# Patient Record
Sex: Female | Born: 1945
Health system: Southern US, Community
[De-identification: ages and names within clinical notes are randomized; demographics above are authoritative.]

## PROBLEM LIST (undated history)

## (undated) DIAGNOSIS — F419 Anxiety disorder, unspecified: Secondary | ICD-10-CM

## (undated) DIAGNOSIS — I4901 Ventricular fibrillation: Secondary | ICD-10-CM

## (undated) DIAGNOSIS — J189 Pneumonia, unspecified organism: Secondary | ICD-10-CM

## (undated) DIAGNOSIS — I82409 Acute embolism and thrombosis of unspecified deep veins of unspecified lower extremity: Secondary | ICD-10-CM

## (undated) DIAGNOSIS — Z973 Presence of spectacles and contact lenses: Secondary | ICD-10-CM

## (undated) DIAGNOSIS — E875 Hyperkalemia: Secondary | ICD-10-CM

## (undated) DIAGNOSIS — M1990 Unspecified osteoarthritis, unspecified site: Secondary | ICD-10-CM

## (undated) DIAGNOSIS — I2510 Atherosclerotic heart disease of native coronary artery without angina pectoris: Secondary | ICD-10-CM

## (undated) DIAGNOSIS — Z96659 Presence of unspecified artificial knee joint: Secondary | ICD-10-CM

## (undated) DIAGNOSIS — I6529 Occlusion and stenosis of unspecified carotid artery: Secondary | ICD-10-CM

## (undated) DIAGNOSIS — K219 Gastro-esophageal reflux disease without esophagitis: Secondary | ICD-10-CM

## (undated) DIAGNOSIS — F410 Panic disorder [episodic paroxysmal anxiety] without agoraphobia: Secondary | ICD-10-CM

## (undated) DIAGNOSIS — E785 Hyperlipidemia, unspecified: Secondary | ICD-10-CM

## (undated) DIAGNOSIS — I10 Essential (primary) hypertension: Secondary | ICD-10-CM

## (undated) DIAGNOSIS — I255 Ischemic cardiomyopathy: Secondary | ICD-10-CM

## (undated) DIAGNOSIS — R51 Headache: Secondary | ICD-10-CM

## (undated) DIAGNOSIS — M1710 Unilateral primary osteoarthritis, unspecified knee: Secondary | ICD-10-CM

## (undated) DIAGNOSIS — D649 Anemia, unspecified: Secondary | ICD-10-CM

## (undated) DIAGNOSIS — M1711 Unilateral primary osteoarthritis, right knee: Secondary | ICD-10-CM

## (undated) HISTORY — PX: ABDOMINAL HYSTERECTOMY: SHX81

## (undated) HISTORY — DX: Hyperkalemia: E87.5

## (undated) HISTORY — DX: Unspecified osteoarthritis, unspecified site: M19.90

## (undated) HISTORY — DX: Essential (primary) hypertension: I10

## (undated) HISTORY — DX: Atherosclerotic heart disease of native coronary artery without angina pectoris: I25.10

## (undated) HISTORY — DX: Occlusion and stenosis of unspecified carotid artery: I65.29

## (undated) HISTORY — DX: Unilateral primary osteoarthritis, unspecified knee: M17.10

## (undated) HISTORY — DX: Anemia, unspecified: D64.9

## (undated) HISTORY — DX: Presence of unspecified artificial knee joint: Z96.659

## (undated) HISTORY — DX: Unilateral primary osteoarthritis, right knee: M17.11

## (undated) HISTORY — DX: Ischemic cardiomyopathy: I25.5

## (undated) HISTORY — PX: DILATION AND CURETTAGE OF UTERUS: SHX78

## (undated) HISTORY — DX: Acute embolism and thrombosis of unspecified deep veins of unspecified lower extremity: I82.409

---

## 2011-01-29 ENCOUNTER — Ambulatory Visit (INDEPENDENT_AMBULATORY_CARE_PROVIDER_SITE_OTHER): Payer: Medicare Other

## 2011-01-29 DIAGNOSIS — I10 Essential (primary) hypertension: Secondary | ICD-10-CM

## 2011-02-28 ENCOUNTER — Ambulatory Visit (INDEPENDENT_AMBULATORY_CARE_PROVIDER_SITE_OTHER): Payer: Medicare Other | Admitting: Family Medicine

## 2011-02-28 VITALS — BP 173/80 | HR 88 | Temp 98.0°F | Resp 16 | Ht 61.75 in | Wt 153.0 lb

## 2011-02-28 DIAGNOSIS — R51 Headache: Secondary | ICD-10-CM

## 2011-02-28 DIAGNOSIS — I10 Essential (primary) hypertension: Secondary | ICD-10-CM

## 2011-02-28 MED ORDER — LISINOPRIL-HYDROCHLOROTHIAZIDE 20-25 MG PO TABS: 1.0000 | ORAL_TABLET | Freq: Every day | ORAL | Status: DC

## 2011-02-28 NOTE — Progress Notes (Signed)
  Subjective:    Patient ID: Susan Welch, female    DOB: 1945/11/03, 66 y.o.   MRN: [id number]  HPI Patient is here for blood pressure recheck. Her blood pressure is still running high. She had a headache this morning. She thought she come by and get rechecked before refilling her medicine. She took a pill this morning.   Review of Systems no cardiovascular or respiratory symptoms. Just a headache     Objective:   Physical Exam Alert and oriented. Fundi benign. No carotid bruits. Chest clear. Heart regular without murmurs. Blood pressure 170/90 in the left arm, 158/88 in the right to       Assessment & Plan:  Hypertension, under satisfactory control Headache  Lisinopril hct 20/25. If not doing better will try adding amlodipine. She is to monitor her blood pressures and come back in one to 3 months.

## 2011-02-28 NOTE — Patient Instructions (Signed)
Monitor blood pressure. If it is continuing to run high get back to [place] sooner, otherwise come back in 2-3 months

## 2011-06-23 ENCOUNTER — Ambulatory Visit (INDEPENDENT_AMBULATORY_CARE_PROVIDER_SITE_OTHER): Payer: Medicare Other | Admitting: Emergency Medicine

## 2011-06-23 VITALS — BP 131/87 | HR 87 | Temp 98.7°F | Resp 16 | Ht 61.0 in | Wt 153.2 lb

## 2011-06-23 DIAGNOSIS — I10 Essential (primary) hypertension: Secondary | ICD-10-CM

## 2011-06-23 DIAGNOSIS — R1900 Intra-abdominal and pelvic swelling, mass and lump, unspecified site: Secondary | ICD-10-CM

## 2011-06-23 MED ORDER — LISINOPRIL-HYDROCHLOROTHIAZIDE 20-25 MG PO TABS: 1.0000 | ORAL_TABLET | Freq: Every day | ORAL | Status: DC

## 2011-06-23 NOTE — Patient Instructions (Signed)
Abdominal Aortic Aneurysm  An aneurysm is the enlargement (dilatation), bulging, or ballooning out of part of the wall of a vein or artery. An aortic aneurysm is a bulging in the largest artery of the body. This artery supplies blood from the heart to the rest of the body.  The first part of the aorta is called the thoracic aorta. It leaves the heart, rises (ascends), arches, and goes down (descends) through the chest until it reaches the diaphragm. The diaphragm is the muscular part between the chest and abdomen.   The second part of the aorta is called the abdominal aorta after it has passed the diaphragm and continues down through the abdomen. The abdominal aorta ends where it splits to form the two iliac arteries that go to the legs.  Aortic aneurysms can develop anywhere along the length of the aorta. The majority are located along the abdominal aorta. The major concern with an aortic aneurysm is that it can enlarge and rupture. This can cause death unless diagnosed and treated promptly. Aneurysms can also develop blood clots or infections. CAUSES  Many aortic aneurysms are caused by arteriosclerosis. Arteriosclerosis can weaken the aortic wall. The pressure of the blood being pumped through the aorta causes it to balloon out at the site of weakness. Therefore, high blood pressure (hypertension) is associated with aneurysm. Other risk factors include:  Age over 13.   Tobacco use.   Being female.   White race.   Family history of aneurysm.   Less frequent causes of abdominal aortic aneurysms include:   Connective tissue diseases.   Abdominal trauma.   Inflammation of blood vessles (arteritis).   Inherited (congenital) malformations.   Infection.  SYMPTOMS  The signs and symptoms of an unruptured aneurysm will partly depend on its size and rate of growth.   Abdominal aortic aneurysms may cause pain. The pain typically has a deep quality as if it is piercing into the person. It is  felt most often in the lower back area. The pain is usually steady but may be relieved by changing your body position.   The person may also become aware of an abnormally prominent pulse in the belly (abdominal pulsation).  DIAGNOSIS  An aortic aneurysm may be discovered by chance on physical exam, or on X-ray studies done for other reasons. It may be suspected because of other problems such as back or abdominal pain. The following tests may help identify the problem.  X-rays of the abdomen can show calcium deposits in the aneurysm wall.   CT scanning of the abdomen, particularly with contrast medium, is accurate at showing the exact size and shape of the aneurysm.   Ultrasounds give a clear picture of the size of an aneurysm (about 98% accuracy).   MRI scanning is accurate, but often unnecessary.   An abdominal angiogram shows the source of the major blood vessels arising from the aorta. It reveals the size and extent of any aneurysm. It can also show a clot clinging to the wall of the aneurysm (mural thrombus).  TREATMENT  Treating an abdominal aortic aneurysm depends on the size. A rupture of an aneurysm is uncommon when they are less than 5 cm wide (2 inches). Rupture is far more common in aneurysms that are over 6 cm wide (2.4 inches).  Surgical repair is usually recommended for all aneurysms over 6 cm wide (2.4 inches). This depends on the health, age, and other circumstances of the individual. This type of surgery consists of  opening the abdomen, removing the aneurysm, and sewing a synthetic graft (similar to a cloth tube) in its place. A less invasive form of this surgery, using stent grafts, is sometimes recommended.   For most patients, elective repair is recommended for aneurysms between 4 and 6 cm (1.6 and 2.4 inches). Elective means the surgery can be done at your convenience. This should not be put off too long if surgery is recommended.   If you smoke, stop immediately. Smoking  is a major risk factor for enlargement and rupture.   Medications may be used to help decrease complications - these include medicine to lower blood pressure and control cholesterol.  HOME CARE INSTRUCTIONS   If you smoke, stop. Do not start smoking.   Take all medications as prescribed.   Your caregiver will tell you when to have your aneurysm rechecked, either by ultrasound or CT scan.   If your caregiver has given you a follow-up appointment, it is very important to keep that appointment. Not keeping the appointment could result in a chronic or permanent injury, pain, or disability. If there is any problem keeping the appointment, you must call back to this facility for assistance.  SEEK MEDICAL CARE IF:   You develop mild abdominal pain or pressure.   You are able to feel or perceive your aneurysm, and you sense any change.  SEEK IMMEDIATE MEDICAL CARE IF:   You develop severe abdominal pain, or severe pain moving (radiating) to your back.   You suddenly develop cold or blue toes or feet.   You suddenly develop lightheadedness or fainting spells.  MAKE SURE YOU:   Understand these instructions.   Will watch your condition.   Will get help right away if you are not doing well or get worse.  Document Released: 10/03/2004 Document Revised: 12/13/2010 Document Reviewed: 07/28/2007 [facility] Patient Information 2012 [place], [place].Hypertension As your heart beats, it forces blood through your arteries. This force is your blood pressure. If the pressure is too high, it is called hypertension (HTN) or high blood pressure. HTN is dangerous because you may have it and not know it. High blood pressure may mean that your heart has to work harder to pump blood. Your arteries may be narrow or stiff. The extra work puts you at risk for heart disease, stroke, and other problems.  Blood pressure consists of two numbers, a higher number over a lower, 110/72, for example. It is stated as "110  over 72." The ideal is below 120 for the top number (systolic) and under 80 for the bottom (diastolic). Write down your blood pressure today. You should pay close attention to your blood pressure if you have certain conditions such as:  Heart failure.   Prior heart attack.   Diabetes   Chronic kidney disease.   Prior stroke.   Multiple risk factors for heart disease.  To see if you have HTN, your blood pressure should be measured while you are seated with your arm held at the level of the heart. It should be measured at least twice. A one-time elevated blood pressure reading (especially in the Emergency Department) does not mean that you need treatment. There may be conditions in which the blood pressure is different between your right and left arms. It is important to see your caregiver soon for a recheck. Most people have essential hypertension which means that there is not a specific cause. This type of high blood pressure may be lowered by changing lifestyle factors such  as:  Stress.   Smoking.   Lack of exercise.   Excessive weight.   Drug/tobacco/alcohol use.   Eating less salt.  Most people do not have symptoms from high blood pressure until it has caused damage to the body. Effective treatment can often prevent, delay or reduce that damage. TREATMENT  When a cause has been identified, treatment for high blood pressure is directed at the cause. There are a large number of medications to treat HTN. These fall into several categories, and your caregiver will help you select the medicines that are best for you. Medications may have side effects. You should review side effects with your caregiver. If your blood pressure stays high after you have made lifestyle changes or started on medicines,   Your medication(s) may need to be changed.   Other problems may need to be addressed.   Be certain you understand your prescriptions, and know how and when to take your medicine.   Be  sure to follow up with your caregiver within the time frame advised (usually within two weeks) to have your blood pressure rechecked and to review your medications.   If you are taking more than one medicine to lower your blood pressure, make sure you know how and at what times they should be taken. Taking two medicines at the same time can result in blood pressure that is too low.  SEEK IMMEDIATE MEDICAL CARE IF:  You develop a severe headache, blurred or changing vision, or confusion.   You have unusual weakness or numbness, or a faint feeling.   You have severe chest or abdominal pain, vomiting, or breathing problems.  MAKE SURE YOU:   Understand these instructions.   Will watch your condition.   Will get help right away if you are not doing well or get worse.  Document Released: 12/24/2004 Document Revised: 12/13/2010 Document Reviewed: 08/14/2007 [facility] Patient Information 2012 [place], [place].

## 2011-06-23 NOTE — Progress Notes (Signed)
   Patient Name: Susan Welch Date of Birth: 1945-02-17 Medical Record Number: [id number] Gender: female Date of Encounter: 06/23/2011  History of Present Illness:  Susan Welch is a 66 y.o. very pleasant female patient who presents with the following:  History of hypertension and under good control with no adverse effect of medication.  Was told by chiropractor that he noticed she had an obvious aorta on xray and that she should let her doctor know about.    There is no problem list on file for this patient.  No past medical history on file. No past surgical history on file. History  Substance Use Topics  . Smoking status: Former Games developer  . Smokeless tobacco: Not on file  . Alcohol Use: Not on file   No family history on file. No Known Allergies  Medication list has been reviewed and updated.  Prior to Admission medications   Medication Sig Start Date End Date Taking? Authorizing Provider  lisinopril-hydrochlorothiazide (PRINZIDE,ZESTORETIC) 20-25 MG per tablet Take 1 tablet by mouth daily. 02/28/11 02/28/12 Yes Peyton Najjar, MD    Review of Systems:  As per HPI, otherwise negative.    Physical Examination: Filed Vitals:   06/23/11 1348  BP: 131/87  Pulse: 87  Temp: 98.7 F (37.1 C)  Resp: 16   Filed Vitals:   06/23/11 1348  Height: 5\' 1"  (1.549 m)  Weight: 153 lb 3.2 oz (69.491 kg)   Body mass index is 28.95 kg/(m^2). Ideal Body Weight: Weight in (lb) to have BMI = 25: 132   GEN: WDWN, NAD, Non-toxic, A & O x 3 HEENT: Atraumatic, Normocephalic. Neck supple. No masses, No LAD. Ears and Nose: No external deformity. CV: RRR, No M/G/R. No JVD. No thrill. No extra heart sounds. PULM: CTA B, no wheezes, crackles, rhonchi. No retractions. No resp. distress. No accessory muscle use. ABD: S, NT, ND, +BS. No rebound. No HSM. EXTR: No c/c/e NEURO Normal gait.  PSYCH: Normally interactive. Conversant. Not depressed or anxious appearing.  Calm demeanor.    Assessment and Plan: Hypertension  Will have come back and obtain labs when fasting.  Susan Dane, MD

## 2011-06-26 ENCOUNTER — Other Ambulatory Visit (INDEPENDENT_AMBULATORY_CARE_PROVIDER_SITE_OTHER): Payer: Medicare Other | Admitting: Family Medicine

## 2011-06-26 ENCOUNTER — Ambulatory Visit: Payer: Medicare Other | Admitting: Family Medicine

## 2011-06-26 DIAGNOSIS — I10 Essential (primary) hypertension: Secondary | ICD-10-CM

## 2011-06-26 LAB — COMPREHENSIVE METABOLIC PANEL
BUN: 21 mg/dL (ref 6–23)
CO2: 28 mEq/L (ref 19–32)
Creat: 0.72 mg/dL (ref 0.50–1.10)
Glucose, Bld: 89 mg/dL (ref 70–99)
Total Bilirubin: 0.3 mg/dL (ref 0.3–1.2)
Total Protein: 7 g/dL (ref 6.0–8.3)

## 2011-06-26 LAB — POCT CBC
HCT, POC: 39.1 % (ref 37.7–47.9)
Hemoglobin: 12.5 g/dL (ref 12.2–16.2)
Lymph, poc: 2.6 (ref 0.6–3.4)
MCH, POC: 28.3 pg (ref 27–31.2)
MCHC: 32 g/dL (ref 31.8–35.4)
MCV: 88.7 fL (ref 80–97)
WBC: 7.7 10*3/uL (ref 4.6–10.2)

## 2011-06-26 LAB — LIPID PANEL
Cholesterol: 258 mg/dL — ABNORMAL HIGH (ref 0–200)
Total CHOL/HDL Ratio: 4.7 Ratio

## 2011-06-26 LAB — TSH: TSH: 2.687 u[IU]/mL (ref 0.350–4.500)

## 2011-06-26 NOTE — Progress Notes (Signed)
      Patient Name: Susan Welch Date of Birth: [birth year] Medical Record Number: [id number] Gender: female Date of Encounter: 06/26/2011  History of Present Illness:  Susan Welch is a 66 y.o. very pleasant female patient who presents with the following:  Here just to have fasting BW today  There is no problem list on file for this patient.  No past medical history on file. No past surgical history on file. History  Substance Use Topics  . Smoking status: Former Games developer  . Smokeless tobacco: Not on file  . Alcohol Use: Not on file   No family history on file. No Known Allergies  Medication list has been reviewed and updated.  Prior to Admission medications   Medication Sig Start Date End Date Taking? Authorizing Provider  lisinopril-hydrochlorothiazide (PRINZIDE,ZESTORETIC) 20-25 MG per tablet Take 1 tablet by mouth daily. 06/23/11 06/22/12  Phillips Odor, MD    Review of Systems:  Not reviewed.  She is fasting today  Physical Examination: Filed Vitals:   06/26/11 0818  BP: 137/80  Pulse: 83  Temp: 98.1 F (36.7 C)  Resp: 16   Filed Vitals:   06/26/11 0818  Height: 5\' 3"  (1.6 m)  Weight: 154 lb (69.854 kg)   Body mass index is 27.28 kg/(m^2). Ideal Body Weight: Weight in (lb) to have BMI = 25: 140.8   .  Assessment and Plan: Wrong template entered- disregard this note  Jurnee Nakayama, MD

## 2011-06-26 NOTE — Progress Notes (Signed)
Here for a lab visit only today.  Ordered FLP, CMP, TSH and CBC.  Will forward results to Dr. Dareen Piano.

## 2011-06-28 ENCOUNTER — Ambulatory Visit
Admission: RE | Admit: 2011-06-28 | Discharge: 2011-06-28 | Disposition: A | Discharge status: Home / Self Care | Payer: Medicare Other | Source: Ambulatory Visit | Attending: Emergency Medicine | Admitting: Emergency Medicine

## 2011-06-28 ENCOUNTER — Other Ambulatory Visit: Payer: Self-pay | Admitting: Family Medicine

## 2011-06-28 DIAGNOSIS — I7789 Other specified disorders of arteries and arterioles: Secondary | ICD-10-CM

## 2011-06-28 DIAGNOSIS — R1900 Intra-abdominal and pelvic swelling, mass and lump, unspecified site: Secondary | ICD-10-CM

## 2011-06-28 DIAGNOSIS — I10 Essential (primary) hypertension: Secondary | ICD-10-CM

## 2011-07-15 ENCOUNTER — Telehealth: Payer: Self-pay

## 2011-07-15 NOTE — Telephone Encounter (Signed)
The patient called to inquire about the results of her recent ultrasound.  Please call patient at [phone] regarding these results.

## 2011-07-15 NOTE — Telephone Encounter (Signed)
Please review u/s and advise.

## 2011-07-16 ENCOUNTER — Telehealth: Payer: Self-pay | Admitting: Family Medicine

## 2011-07-16 NOTE — Telephone Encounter (Signed)
Pt notified and understands. Susan Welch

## 2011-07-16 NOTE — Telephone Encounter (Signed)
I reviewed the [place] - it looks normal.

## 2011-07-16 NOTE — Telephone Encounter (Signed)
Please reviews Pt's labs done 06/26/11 and advise- she is very interested in her chol. numbers. I did tell her they were elevated but I needed a provider to check out the labs before officially saying anything further. Please advise and call Pt.  Thanks! Eileen Stanford

## 2011-07-17 ENCOUNTER — Encounter: Payer: Self-pay | Admitting: Emergency Medicine

## 2011-07-17 MED ORDER — ATORVASTATIN CALCIUM 20 MG PO TABS: 20.0000 mg | ORAL_TABLET | Freq: Every day | ORAL | Status: DC

## 2011-07-17 NOTE — Telephone Encounter (Signed)
Hi!  I ordered labs for this patient when she came in for fasting labs- we had not realized you had ordered them already.  Thanks!

## 2011-09-20 ENCOUNTER — Ambulatory Visit (INDEPENDENT_AMBULATORY_CARE_PROVIDER_SITE_OTHER): Payer: Medicare Other | Admitting: Internal Medicine

## 2011-09-20 VITALS — BP 122/74 | HR 86 | Temp 98.2°F | Resp 16 | Ht 63.0 in | Wt 157.0 lb

## 2011-09-20 DIAGNOSIS — E789 Disorder of lipoprotein metabolism, unspecified: Secondary | ICD-10-CM | POA: Insufficient documentation

## 2011-09-20 DIAGNOSIS — I10 Essential (primary) hypertension: Secondary | ICD-10-CM

## 2011-09-20 DIAGNOSIS — E785 Hyperlipidemia, unspecified: Secondary | ICD-10-CM | POA: Insufficient documentation

## 2011-09-20 DIAGNOSIS — Z7189 Other specified counseling: Secondary | ICD-10-CM

## 2011-09-20 HISTORY — DX: Essential (primary) hypertension: I10

## 2011-09-20 LAB — POCT CBC
MCH, POC: 28.2 pg (ref 27–31.2)
MCHC: 30.7 g/dL — AB (ref 31.8–35.4)
MCV: 91.8 fL (ref 80–97)
MID (cbc): 0.5 (ref 0–0.9)
POC LYMPH PERCENT: 28.6 %L (ref 10–50)
Platelet Count, POC: 358 10*3/uL (ref 142–424)
RDW, POC: 13.6 %
WBC: 8.5 10*3/uL (ref 4.6–10.2)

## 2011-09-20 MED ORDER — ATORVASTATIN CALCIUM 20 MG PO TABS: 20.0000 mg | ORAL_TABLET | Freq: Every day | ORAL | Status: DC

## 2011-09-20 MED ORDER — LISINOPRIL-HYDROCHLOROTHIAZIDE 20-25 MG PO TABS: 1.0000 | ORAL_TABLET | Freq: Every day | ORAL | Status: DC

## 2011-09-20 NOTE — Progress Notes (Signed)
  Subjective:    Patient ID: Susan Welch, female    DOB: [birth year], 66 y.o.   MRN: [id number]  HPI  66 year old female is here today for refill on lisinopril.  Patient comes to [facility].      Review of Systems     Objective:   Physical Exam        Assessment & Plan:

## 2011-09-20 NOTE — Patient Instructions (Addendum)

## 2011-09-20 NOTE — Progress Notes (Signed)
  Subjective:    Patient ID: Susan Welch, female    DOB: May 17, 1945, 66 y.o.   MRN: [id number]  HPI F/up htn and lipids. On her 1st statin, lipitor withno side affects. No problems with either med   Review of Systems     Objective:   Physical Exam Lungs clear Heart nl labs      Assessment & Plan:  RF meds 1 yr

## 2011-09-21 LAB — COMPREHENSIVE METABOLIC PANEL
BUN: 20 mg/dL (ref 6–23)
CO2: 26 mEq/L (ref 19–32)
Calcium: 9.9 mg/dL (ref 8.4–10.5)
Chloride: 104 mEq/L (ref 96–112)
Creat: 0.73 mg/dL (ref 0.50–1.10)

## 2011-09-21 LAB — LIPID PANEL: LDL Cholesterol: 85 mg/dL (ref 0–99)

## 2011-09-24 ENCOUNTER — Encounter: Payer: Self-pay | Admitting: *Deleted

## 2012-01-30 ENCOUNTER — Ambulatory Visit (INDEPENDENT_AMBULATORY_CARE_PROVIDER_SITE_OTHER): Payer: Medicare Other | Admitting: Family Medicine

## 2012-01-30 VITALS — BP 130/86 | HR 106 | Temp 97.3°F | Resp 18 | Ht 62.5 in | Wt 158.0 lb

## 2012-01-30 DIAGNOSIS — T887XXA Unspecified adverse effect of drug or medicament, initial encounter: Secondary | ICD-10-CM

## 2012-01-30 DIAGNOSIS — E663 Overweight: Secondary | ICD-10-CM

## 2012-01-30 DIAGNOSIS — I10 Essential (primary) hypertension: Secondary | ICD-10-CM

## 2012-01-30 DIAGNOSIS — E785 Hyperlipidemia, unspecified: Secondary | ICD-10-CM

## 2012-01-30 NOTE — Patient Instructions (Addendum)
Hypertriglyceridemia  Diet for High blood levels of Triglycerides Most fats in food are triglycerides. Triglycerides in your blood are stored as fat in your body. High levels of triglycerides in your blood may put you at a greater risk for heart disease and stroke.  Normal triglyceride levels are less than 150 mg/dL. Borderline high levels are 150-199 mg/dl. High levels are 200 - 499 mg/dL, and very high triglyceride levels are greater than 500 mg/dL. The decision to treat high triglycerides is generally based on the level. For people with borderline or high triglyceride levels, treatment includes weight loss and exercise. Drugs are recommended for people with very high triglyceride levels. Many people who need treatment for high triglyceride levels have metabolic syndrome. This syndrome is a collection of disorders that often include: insulin resistance, high blood pressure, blood clotting problems, high cholesterol and triglycerides. TESTING PROCEDURE FOR TRIGLYCERIDES  You should not eat 4 hours before getting your triglycerides measured. The normal range of triglycerides is between 10 and 250 milligrams per deciliter (mg/dl). Some people may have extreme levels (1000 or above), but your triglyceride level may be too high if it is above 150 mg/dl, depending on what other risk factors you have for heart disease.  People with high blood triglycerides may also have high blood cholesterol levels. If you have high blood cholesterol as well as high blood triglycerides, your risk for heart disease is probably greater than if you only had high triglycerides. High blood cholesterol is one of the main risk factors for heart disease. CHANGING YOUR DIET  Your weight can affect your blood triglyceride level. If you are more than 20% above your ideal body weight, you may be able to lower your blood triglycerides by losing weight. Eating less and exercising regularly is the best way to combat this. Fat provides more  calories than any other food. The best way to lose weight is to eat less fat. Only 30% of your total calories should come from fat. Less than 7% of your diet should come from saturated fat. A diet low in fat and saturated fat is the same as a diet to decrease blood cholesterol. By eating a diet lower in fat, you may lose weight, lower your blood cholesterol, and lower your blood triglyceride level.  Eating a diet low in fat, especially saturated fat, may also help you lower your blood triglyceride level. Ask your dietitian to help you figure how much fat you can eat based on the number of calories your caregiver has prescribed for you.  Exercise, in addition to helping with weight loss may also help lower triglyceride levels.   Alcohol can increase blood triglycerides. You may need to stop drinking alcoholic beverages.  Too much carbohydrate in your diet may also increase your blood triglycerides. Some complex carbohydrates are necessary in your diet. These may include bread, rice, potatoes, other starchy vegetables and cereals.  Reduce "simple" carbohydrates. These may include pure sugars, candy, honey, and jelly without losing other nutrients. If you have the kind of high blood triglycerides that is affected by the amount of carbohydrates in your diet, you will need to eat less sugar and less high-sugar foods. Your caregiver can help you with this.  Adding 2-4 grams of fish oil (EPA+ DHA) may also help lower triglycerides. Speak with your caregiver before adding any supplements to your regimen. Following the Diet  Maintain your ideal weight. Your caregivers can help you with a diet. Generally, eating less food and getting more   exercise will help you lose weight. Joining a weight control group may also help. Ask your caregivers for a good weight control group in your area.  Eat low-fat foods instead of high-fat foods. This can help you lose weight too.  These foods are lower in fat. Eat MORE of these:    Dried beans, peas, and lentils.  Egg whites.  Low-fat cottage cheese.  Fish.  Lean cuts of meat, such as round, sirloin, rump, and flank (cut extra fat off meat you fix).  Whole grain breads, cereals and pasta.  Skim and nonfat dry milk.  Low-fat yogurt.  Poultry without the skin.  Cheese made with skim or part-skim milk, such as mozzarella, parmesan, farmers', ricotta, or pot cheese. These are higher fat foods. Eat LESS of these:   Whole milk and foods made from whole milk, such as American, blue, cheddar, monterey jack, and swiss cheese  High-fat meats, such as luncheon meats, sausages, knockwurst, bratwurst, hot dogs, ribs, corned beef, ground pork, and regular ground beef.  Fried foods. Limit saturated fats in your diet. Substituting unsaturated fat for saturated fat may decrease your blood triglyceride level. You will need to read package labels to know which products contain saturated fats.  These foods are high in saturated fat. Eat LESS of these:   Fried pork skins.  Whole milk.  Skin and fat from poultry.  Palm oil.  Butter.  Shortening.  Cream cheese.  Bacon.  Margarines and baked goods made from listed oils.  Vegetable shortenings.  Chitterlings.  Fat from meats.  Coconut oil.  Palm kernel oil.  Lard.  Cream.  Sour cream.  Fatback.  Coffee whiteners and non-dairy creamers made with these oils.  Cheese made from whole milk. Use unsaturated fats (both polyunsaturated and monounsaturated) moderately. Remember, even though unsaturated fats are better than saturated fats; you still want a diet low in total fat.  These foods are high in unsaturated fat:   Canola oil.  Sunflower oil.  Mayonnaise.  Almonds.  Peanuts.  Pine nuts.  Margarines made with these oils.  Safflower oil.  Olive oil.  Avocados.  Cashews.  Peanut butter.  Sunflower seeds.  Soybean oil.  Peanut  oil.  Olives.  Pecans.  Walnuts.  Pumpkin seeds. Avoid sugar and other high-sugar foods. This will decrease carbohydrates without decreasing other nutrients. Sugar in your food goes rapidly to your blood. When there is excess sugar in your blood, your liver may use it to make more triglycerides. Sugar also contains calories without other important nutrients.  Eat LESS of these:   Sugar, brown sugar, powdered sugar, jam, jelly, preserves, honey, syrup, molasses, pies, candy, cakes, cookies, frosting, pastries, colas, soft drinks, punches, fruit drinks, and regular gelatin.  Avoid alcohol. Alcohol, even more than sugar, may increase blood triglycerides. In addition, alcohol is high in calories and low in nutrients. Ask for sparkling water, or a diet soft drink instead of an alcoholic beverage. Suggestions for planning and preparing meals   Bake, broil, grill or roast meats instead of frying.  Remove fat from meats and skin from poultry before cooking.  Add spices, herbs, lemon juice or vinegar to vegetables instead of salt, rich sauces or gravies.  Use a non-stick skillet without fat or use no-stick sprays.  Cool and refrigerate stews and broth. Then remove the hardened fat floating on the surface before serving.  Refrigerate meat drippings and skim off fat to make low-fat gravies.  Serve more fish.  Use less butter,   margarine and other high-fat spreads on bread or vegetables.  Use skim or reconstituted non-fat dry milk for cooking.  Cook with low-fat cheeses.  Substitute low-fat yogurt or cottage cheese for all or part of the sour cream in recipes for sauces, dips or congealed salads.  Use half yogurt/half mayonnaise in salad recipes.  Substitute evaporated skim milk for cream. Evaporated skim milk or reconstituted non-fat dry milk can be whipped and substituted for whipped cream in certain recipes.  Choose fresh fruits for dessert instead of high-fat foods such as pies or  cakes. Fruits are naturally low in fat. When Dining Out   Order low-fat appetizers such as fruit or vegetable juice, pasta with vegetables or tomato sauce.  Select clear, rather than cream soups.  Ask that dressings and gravies be served on the side. Then use less of them.  Order foods that are baked, broiled, poached, steamed, stir-fried, or roasted.  Ask for margarine instead of butter, and use only a small amount.  Drink sparkling water, unsweetened tea or coffee, or diet soft drinks instead of alcohol or other sweet beverages. QUESTIONS AND ANSWERS ABOUT OTHER FATS IN THE BLOOD: SATURATED FAT, TRANS FAT, AND CHOLESTEROL What is trans fat? Trans fat is a type of fat that is formed when vegetable oil is hardened through a process called hydrogenation. This process helps makes foods more solid, gives them shape, and prolongs their shelf life. Trans fats are also called hydrogenated or partially hydrogenated oils.  What do saturated fat, trans fat, and cholesterol in foods have to do with heart disease? Saturated fat, trans fat, and cholesterol in the diet all raise the level of LDL "bad" cholesterol in the blood. The higher the LDL cholesterol, the greater the risk for coronary heart disease (CHD). Saturated fat and trans fat raise LDL similarly.  What foods contain saturated fat, trans fat, and cholesterol? High amounts of saturated fat are found in animal products, such as fatty cuts of meat, chicken skin, and full-fat dairy products like butter, whole milk, cream, and cheese, and in tropical vegetable oils such as palm, palm kernel, and coconut oil. Trans fat is found in some of the same foods as saturated fat, such as vegetable shortening, some margarines (especially hard or stick margarine), crackers, cookies, baked goods, fried foods, salad dressings, and other processed foods made with partially hydrogenated vegetable oils. Small amounts of trans fat also occur naturally in some animal  products, such as milk products, beef, and lamb. Foods high in cholesterol include liver, other organ meats, egg yolks, shrimp, and full-fat dairy products. How can I use the new food label to make heart-healthy food choices? Check the Nutrition Facts panel of the food label. Choose foods lower in saturated fat, trans fat, and cholesterol. For saturated fat and cholesterol, you can also use the Percent Daily Value (%DV): 5% DV or less is low, and 20% DV or more is high. (There is no %DV for trans fat.) Use the Nutrition Facts panel to choose foods low in saturated fat and cholesterol, and if the trans fat is not listed, read the ingredients and limit products that list shortening or hydrogenated or partially hydrogenated vegetable oil, which tend to be high in trans fat. POINTS TO REMEMBER:   Discuss your risk for heart disease with your caregivers, and take steps to reduce risk factors.  Change your diet. Choose foods that are low in saturated fat, trans fat, and cholesterol.  Add exercise to your daily routine if   it is not already being done. Participate in physical activity of moderate intensity, like brisk walking, for at least 30 minutes on most, and preferably all days of the week. No time? Break the 30 minutes into three, 10-minute segments during the day.  Stop smoking. If you do smoke, contact your caregiver to discuss ways in which they can help you quit.  Do not use street drugs.  Maintain a normal weight.  Maintain a healthy blood pressure.  Keep up with your blood work for checking the fats in your blood as directed by your caregiver. Document Released: 10/12/2003 Document Revised: 06/25/2011 Document Reviewed: 05/09/2008 [facility] Patient Information 2013 [place], [place].    [facility] web site- link to Lipid Clinic and look for Lipid Clinic information.  Also get Citracal + D and take it twice a day for bone health.

## 2012-02-01 ENCOUNTER — Encounter: Payer: Self-pay | Admitting: Family Medicine

## 2012-02-01 NOTE — Progress Notes (Signed)
S:  This 67 y.o  Cauc female is here to discuss medication side effect and treatment for elevated lipids.  Lipid panel in June 2013 showed TChol= 258, TGs= 176 and LDL=168. Pt started Atorvastatin in July 2013 and had done well until recently, when she began to experience increasing knee pain w/ mild myalgias. She is fairly active but knee pain became so severe that she had to curtail her physical fitness routine. Pt has discontinued Atorvastatin more than 1 month ago. She plans to see an Orthopedist within the next month to have a thorough evaluation of knee pain. She would like to address this lipid abnormality in some way that does not include statins/ medications. She denies abd pain, n/v, change in stools or cognitive problems.  HTN- controlled on medication. BP checks away from office show readings c/w today's visit. Pt denies fatigue, diaphoresis, CP or tightness, palpitations, SOB or DOE, cough, HA, dizziness, weakness, numbness or syncoope.  ROS: As per HPI.  O:  Filed Vitals:   01/30/12 1527  BP: 130/86  Pulse: 106  Temp: 97.3 F (36.3 C)  Resp: 18   GEN: In NAD; WN/WD. HENT: [place]/AT; EOMI w/ clear conj/ sclerae. EACs normal. Oroph moist and clear. Otherwise unremarkable. COR; RRR. LUNGS: Normal resp rate and effort. SKIN: W&D; no rashes, erythema or pallor. MS: MAEs; no deformities. Knees- good ROM; no effusion or redness. Nontender w/ palpation. NEURO: A&O x 3; CNs intact. Nonfocal.  A/P:  1. Hyperlipidemia    Pt will remain off statin; labs to be rechecked in future.She will take Fish Oil  2. Medication side effect   3. Overweight (BMI 25.0-29.9)    Will resume physical activities when knee pain resolves.  4. HTN, goal below 140/90       Continue current medication.

## 2012-02-26 ENCOUNTER — Telehealth: Payer: Self-pay

## 2012-02-26 NOTE — Telephone Encounter (Signed)
Received a call from Wayne at [facility] concerning a surgical clearance they had sent to Dr Audria Nine for this pt. After checking with Dr Audria Nine, she reviewed the request and is unable to clear for surgery w/out seeing pt to eval first. She saw pt for unrelated matter recently but has no EKG on file for pt and has not eval pt for safety of surgery. Called [facility] back to advise her.  Called pt to advise her that she needs to RTC for clearance. Pt apologized bc she had meant to come in for a CPE and had not had a chance to call to set up appt. I transferred her to billing to sch appt.

## 2012-04-03 ENCOUNTER — Ambulatory Visit (INDEPENDENT_AMBULATORY_CARE_PROVIDER_SITE_OTHER): Payer: Medicare Other | Admitting: Family Medicine

## 2012-04-03 ENCOUNTER — Encounter: Payer: Self-pay | Admitting: Family Medicine

## 2012-04-03 VITALS — BP 168/90 | HR 72 | Temp 97.4°F | Resp 16 | Ht 61.5 in | Wt 151.0 lb

## 2012-04-03 DIAGNOSIS — Z1231 Encounter for screening mammogram for malignant neoplasm of breast: Secondary | ICD-10-CM

## 2012-04-03 DIAGNOSIS — Z23 Encounter for immunization: Secondary | ICD-10-CM

## 2012-04-03 DIAGNOSIS — E785 Hyperlipidemia, unspecified: Secondary | ICD-10-CM

## 2012-04-03 DIAGNOSIS — I10 Essential (primary) hypertension: Secondary | ICD-10-CM

## 2012-04-03 DIAGNOSIS — F411 Generalized anxiety disorder: Secondary | ICD-10-CM

## 2012-04-03 DIAGNOSIS — Z0000 Encounter for general adult medical examination without abnormal findings: Secondary | ICD-10-CM

## 2012-04-03 LAB — POCT URINALYSIS DIPSTICK
Bilirubin, UA: NEGATIVE
Glucose, UA: NEGATIVE
Leukocytes, UA: NEGATIVE
Nitrite, UA: NEGATIVE
pH, UA: 7

## 2012-04-03 LAB — LIPID PANEL
HDL: 55 mg/dL (ref >39)
Triglycerides: 162 mg/dL — ABNORMAL HIGH (ref <150)

## 2012-04-03 LAB — BASIC METABOLIC PANEL
CO2: 29 mEq/L (ref 19–32)
Calcium: 9.6 mg/dL (ref 8.4–10.5)
Glucose, Bld: 83 mg/dL (ref 70–99)
Sodium: 141 mEq/L (ref 135–145)

## 2012-04-03 NOTE — Progress Notes (Signed)
Subjective:    Patient ID: Susan Welch, female    DOB: [birth year], 67 y.o.   MRN: [id number]  HPI  This 67 y.o. Cauc female is here for CPE (pre-operative clearance). She is planning to under-  go TKR (L knee first) later this year. For joint pain, ORTHO prescribed Meloxicam which she takes  as needed.    Pt has Hyperlipidemia but has remained off statin due to side effect. She now takes [employer]. Diet modifications include no meat except occasional salmon, more tofu and fruits and  vegetables.    She reports intermittent panic attacks due to personal family issue which will be resolved within  next few months. She does not experience the attacks if she is occupied with activities such as  work. She declines any medication at this time.   Pt has HTN and BP readings at home are normal: SBP= 120-150.   HCM: Last MMG was ~ 15 years ago in [place], [place] (normal per pt); does BSE            Last CRS- ?   Review of Systems  Constitutional: Negative.   HENT: Positive for sneezing, postnasal drip and tinnitus.   Eyes: Negative.   Respiratory: Positive for chest tightness. Negative for cough, choking and shortness of breath.   Cardiovascular: Positive for palpitations. Negative for chest pain.  Gastrointestinal: Negative.   Endocrine: Negative.   Genitourinary: Negative.   Musculoskeletal: Positive for gait problem.  Skin: Negative.   Allergic/Immunologic: Negative.   Neurological: Negative.   Hematological: Negative.   Psychiatric/Behavioral: Positive for sleep disturbance. Negative for suicidal ideas, dysphoric mood, decreased concentration and agitation. The patient is nervous/anxious.        Objective:   Physical Exam  Nursing note and vitals reviewed. Constitutional: She is oriented to person, place, and time. She appears well-developed and well-nourished. No distress.  HENT:  Head: Normocephalic and atraumatic.  Right Ear: Hearing, tympanic membrane,  external ear and ear canal normal.  Left Ear: Hearing, tympanic membrane, external ear and ear canal normal.  Nose: No nasal deformity or septal deviation. Right sinus exhibits maxillary sinus tenderness. Right sinus exhibits no frontal sinus tenderness. Left sinus exhibits maxillary sinus tenderness. Left sinus exhibits no frontal sinus tenderness.  Mouth/Throat: Uvula is midline, oropharynx is clear and moist and mucous membranes are normal. No oral lesions. Normal dentition. No dental caries.  Eyes: Conjunctivae and EOM are normal. Pupils are equal, round, and reactive to light. No scleral icterus.  Neck: Normal range of motion. Neck supple. No JVD present. No thyromegaly present.  Cardiovascular: Normal rate, regular rhythm, normal heart sounds and intact distal pulses.  Exam reveals no gallop and no friction rub.   No murmur heard. Pulmonary/Chest: Effort normal and breath sounds normal. No respiratory distress.  Abdominal: Soft. She exhibits pulsatile midline mass. She exhibits no distension, no abdominal bruit and no mass. Bowel sounds are decreased. There is no hepatosplenomegaly. There is no tenderness. There is no guarding and no CVA tenderness. No hernia.  Genitourinary:  NEFG; no exam otherwise.  Musculoskeletal: She exhibits tenderness. She exhibits no edema.  Knees- deg changes (bilateral) w/ decreased ROM and tenderness at tibial plateau. No effusions.  Lymphadenopathy:    She has no cervical adenopathy.  Neurological: She is alert and oriented to person, place, and time. She has normal reflexes. No cranial nerve deficit. She exhibits normal muscle tone. Coordination normal.  Skin: Skin is warm and dry. No erythema. No pallor.  Numerous Seb Ks of various colors/ sizes on trunk (mostly back): largest is ~3 cm, uniform in color and located on R upper flank  Psychiatric: She has a normal mood and affect. Her behavior is normal. Judgment and thought content normal.     Results for  orders placed in visit on 04/03/12  POCT URINALYSIS DIPSTICK      Result Value Range   Color, UA yellow     Clarity, UA clear     Glucose, UA neg     Bilirubin, UA neg     Ketones, UA neg     Spec Grav, UA 1.020     Blood, UA neg     pH, UA 7.0     Protein, UA neg     Urobilinogen, UA 0.2     Nitrite, UA neg     Leukocytes, UA Negative      ECG: NSR; no ST-TW changes and no ectopy.     Assessment & Plan:  Routine general medical examination at a health care facility - Plan: POCT urinalysis dipstick  Other and unspecified hyperlipidemia - Plan: Lipid panel  HTN (hypertension) - stable; continue current medication.  Plan: EKG 12-Lead, Basic metabolic panel  Anxiety state, unspecified  Need for prophylactic vaccination against Streptococcus pneumoniae (pneumococcus) - Plan: Pneumococcal polysaccharide vaccine 23-valent greater than or equal to 2yo subcutaneous/IM  Other screening mammogram - Plan: MM Digital Screening  This visit note will be faxed to [facility] along with clearance form for surgery. Pt  Is low-risk candidate for proposed procedure.

## 2012-04-03 NOTE — Patient Instructions (Signed)
Keeping You Healthy  Get These Tests  Blood Pressure- Have your blood pressure checked by your healthcare provider at least once a year.  Normal blood pressure is 120/80.  Weight- Have your body mass index (BMI) calculated to screen for obesity.  BMI is a measure of body fat based on height and weight.  You can calculate your own BMI at https://www.west-esparza.com/  Cholesterol- Have your cholesterol checked every year.  Diabetes- Have your blood sugar checked every year if you have high blood pressure, high cholesterol, a family history of diabetes or if you are overweight.  Pap Smear- Have a pap smear every 1 to 3 years if you have been sexually active.  If you are older than 65 and recent pap smears have been normal you may not need additional pap smears.  In addition, if you have had a hysterectomy  For benign disease additional pap smears are not necessary.  Mammogram-Yearly mammograms are essential for early detection of breast cancer  Screening for Colon Cancer- Colonoscopy starting at age 64. Screening may begin sooner depending on your family history and other health conditions.  Follow up colonoscopy as directed by your Gastroenterologist.  Screening for Osteoporosis- Screening begins at age 83 with bone density scanning, sooner if you are at higher risk for developing Osteoporosis.  Get these medicines  Calcium with Vitamin D- Your body requires 1200-1500 mg of Calcium a day and [phone] IU of Vitamin D a day.  You can only absorb 500 mg of Calcium at a time therefore Calcium must be taken in 2 or 3 separate doses throughout the day.  Hormones- Hormone therapy has been associated with increased risk for certain cancers and heart disease.  Talk to your healthcare provider about if you need relief from menopausal symptoms.  Aspirin- Ask your healthcare provider about taking Aspirin to prevent Heart Disease and Stroke.  Get these Immuniztions  Flu shot- Every fall  Pneumonia  shot- Once after the age of 24; if you are younger ask your healthcare provider if you need a pneumonia shot.  Tetanus- Every ten years. You received a Tdap today (this is a one-time vaccine). Next Tetanus is due in 10 years.  Zostavax- Once after the age of 45 to prevent shingles.  I have given you a prescription to have this vaccine administered at [facility] by the pharmacist.  Take these steps  Don't smoke- Your healthcare provider can help you quit. For tips on how to quit, ask your healthcare provider or go to www.smokefree.gov or call 1-800 QUIT-NOW.  Be physically active- Exercise 5 days a week for a minimum of 30 minutes.  If you are not already physically active, start slow and gradually work up to 30 minutes of moderate physical activity.  Try walking, dancing, bike riding, swimming, etc.  Eat a healthy diet- Eat a variety of healthy foods such as fruits, vegetables, whole grains, low fat milk, low fat cheeses, yogurt, lean meats, chicken, fish, eggs, dried beans, tofu, etc.  For more information go to www.thenutritionsource.org  Dental visit- Brush and floss teeth twice daily; visit your dentist twice a year.  Eye exam- Visit your Optometrist or Ophthalmologist yearly.  Drink alcohol in moderation- Limit alcohol intake to one drink or less a day.  Never drink and drive.  Depression- Your emotional health is as important as your physical health.  If you're feeling down or losing interest in things you normally enjoy, please talk to your healthcare provider.  Seat Belts-  can save your life; always wear one  Smoke/Carbon Monoxide detectors- These detectors need to be installed on the appropriate level of your home.  Replace batteries at least once a year.  Violence- If anyone is threatening or hurting you, please tell your healthcare provider.  Living Will/ Health care power of attorney- Discuss with your healthcare provider and family.

## 2012-04-06 ENCOUNTER — Encounter: Payer: Self-pay | Admitting: Family Medicine

## 2012-04-08 ENCOUNTER — Encounter: Payer: Self-pay | Admitting: Family Medicine

## 2012-04-24 ENCOUNTER — Ambulatory Visit (HOSPITAL_COMMUNITY): Payer: Medicare Other

## 2012-05-01 ENCOUNTER — Ambulatory Visit (HOSPITAL_COMMUNITY)
Admission: RE | Admit: 2012-05-01 | Discharge: 2012-05-01 | Disposition: A | Discharge status: Home / Self Care | Payer: Medicare Other | Source: Ambulatory Visit | Attending: Family Medicine | Admitting: Family Medicine

## 2012-05-01 DIAGNOSIS — Z1231 Encounter for screening mammogram for malignant neoplasm of breast: Secondary | ICD-10-CM

## 2012-06-30 ENCOUNTER — Encounter (HOSPITAL_COMMUNITY): Payer: Self-pay

## 2012-07-01 ENCOUNTER — Other Ambulatory Visit: Payer: Self-pay | Admitting: Orthopedic Surgery

## 2012-07-07 ENCOUNTER — Encounter (HOSPITAL_COMMUNITY)
Admission: RE | Admit: 2012-07-07 | Discharge: 2012-07-07 | Disposition: A | Discharge status: Home / Self Care | Payer: Medicare Other | Source: Ambulatory Visit | Attending: Orthopedic Surgery | Admitting: Orthopedic Surgery

## 2012-07-07 ENCOUNTER — Ambulatory Visit (HOSPITAL_COMMUNITY)
Admission: RE | Admit: 2012-07-07 | Discharge: 2012-07-07 | Disposition: A | Discharge status: Home / Self Care | Payer: Medicare Other | Source: Ambulatory Visit | Attending: Orthopedic Surgery | Admitting: Orthopedic Surgery

## 2012-07-07 ENCOUNTER — Encounter (HOSPITAL_COMMUNITY): Payer: Self-pay

## 2012-07-07 DIAGNOSIS — Z01818 Encounter for other preprocedural examination: Secondary | ICD-10-CM | POA: Insufficient documentation

## 2012-07-07 DIAGNOSIS — I10 Essential (primary) hypertension: Secondary | ICD-10-CM | POA: Insufficient documentation

## 2012-07-07 DIAGNOSIS — Z01812 Encounter for preprocedural laboratory examination: Secondary | ICD-10-CM | POA: Insufficient documentation

## 2012-07-07 DIAGNOSIS — J449 Chronic obstructive pulmonary disease, unspecified: Secondary | ICD-10-CM | POA: Insufficient documentation

## 2012-07-07 DIAGNOSIS — Z0183 Encounter for blood typing: Secondary | ICD-10-CM | POA: Insufficient documentation

## 2012-07-07 DIAGNOSIS — J4489 Other specified chronic obstructive pulmonary disease: Secondary | ICD-10-CM | POA: Insufficient documentation

## 2012-07-07 DIAGNOSIS — I82409 Acute embolism and thrombosis of unspecified deep veins of unspecified lower extremity: Secondary | ICD-10-CM

## 2012-07-07 HISTORY — DX: Pneumonia, unspecified organism: J18.9

## 2012-07-07 HISTORY — DX: Essential (primary) hypertension: I10

## 2012-07-07 HISTORY — DX: Headache: R51

## 2012-07-07 HISTORY — DX: Anxiety disorder, unspecified: F41.9

## 2012-07-07 HISTORY — DX: Acute embolism and thrombosis of unspecified deep veins of unspecified lower extremity: I82.409

## 2012-07-07 LAB — CBC
HCT: 38.9 % (ref 36.0–46.0)
MCH: 29.7 pg (ref 26.0–34.0)
MCV: 85.7 fL (ref 78.0–100.0)
RDW: 13.4 % (ref 11.5–15.5)
WBC: 9 10*3/uL (ref 4.0–10.5)

## 2012-07-07 LAB — ABO/RH: ABO/RH(D): A POS

## 2012-07-07 LAB — TYPE AND SCREEN: ABO/RH(D): A POS

## 2012-07-07 LAB — URINALYSIS, ROUTINE W REFLEX MICROSCOPIC
Glucose, UA: NEGATIVE mg/dL
Leukocytes, UA: NEGATIVE
Protein, ur: NEGATIVE mg/dL
Specific Gravity, Urine: 1.023 (ref 1.005–1.030)
Urobilinogen, UA: 0.2 mg/dL (ref 0.0–1.0)

## 2012-07-07 LAB — BASIC METABOLIC PANEL
BUN: 29 mg/dL — ABNORMAL HIGH (ref 6–23)
CO2: 30 mEq/L (ref 19–32)
Chloride: 99 mEq/L (ref 96–112)
Creatinine, Ser: 0.72 mg/dL (ref 0.50–1.10)
Glucose, Bld: 91 mg/dL (ref 70–99)

## 2012-07-07 NOTE — Progress Notes (Signed)
Pt denies SOB, chest pain, and being under the care of a cardiologist. According to pt, "I had a stress test a long time ago; at least 10 years ago.

## 2012-07-07 NOTE — Pre-Procedure Instructions (Signed)
Nevaya Nagele  07/07/2012   Your procedure is scheduled on: Wednesday, July 15, 2012  Report to [facility] Short Stay Center ( use [place] elevators to 3rd floor Short Stay unit ) at 8:00 AM.  Call this number if you have problems the morning of surgery: [phone]   Remember:   Do not eat food or drink liquids after midnight.   Take these medicines the morning of surgery with A SIP OF WATER: acetaminophen (TYLENOL) 500 MG tablet             Stop taking Aspirin and herbal medications. Do not take any NSAIDs ie: Ibuprofen, Advil, Naproxen or any medication               containing Aspirin.   Do not wear jewelry, make-up or nail polish.  Do not wear lotions, powders, or perfumes. You may wear deodorant.  Do not shave 48 hours prior to surgery.  Do not bring valuables to the hospital.  [facility] is not responsible for any belongings or valuables.  Contacts, dentures or bridgework may not be worn into surgery.  Leave suitcase in the car. After surgery it may be brought to your room.  For patients admitted to the hospital, checkout time is 11:00 AM the day of discharge.   Patients discharged the day of surgery will not be allowed to drive home.  Name and phone number of your driver:   Special Instructions:Shower using CHG 2 nights before surgery and the night before surgery. If you shower the day of surgery use CHG. Use special wash - you have one bottle of CHG for all showers. You should use approximately 1/3 of the bottle for each shower.        Please read over the following fact sheets that you were given: Pain Booklet, Coughing and Deep Breathing, Blood Transfusion Information, Total Joint Packet, MRSA Information and Surgical Site Infection Prevention

## 2012-07-07 NOTE — Progress Notes (Signed)
Pt denies chest pain and being under the care of a cardiologist however, admits to getting SOB with exertion and  panic attacks. Cordelia Pen, of Dr. Ashley Royalty office, made aware to have Dr. Eulah Pont include operative extremity in order for consent; consent needed DOS.

## 2012-07-08 ENCOUNTER — Other Ambulatory Visit: Payer: Self-pay | Admitting: Physician Assistant

## 2012-07-14 MED ORDER — CEFAZOLIN SODIUM-DEXTROSE 2-3 GM-% IV SOLR
2.0000 g | INTRAVENOUS | Status: AC
  Administered 2012-07-15: 2 g via INTRAVENOUS
  Filled 2012-07-14: qty 50

## 2012-07-14 NOTE — H&P (Signed)
  [facility] [address]   [address] [place], [place] [address] [phone] A Division of [facility]  Loreta Ave, M.D.   Robert A. Thurston Hole, M.D.   Burnell Blanks, M.D.   Eulas Post, M.D.   Lunette Stands, M.D Buford Dresser, M.D.  Charlsie Quest, M.D.   Estell Harpin, M.D.   Melina Fiddler, M.D. Genene Churn. Barry Dienes, PA-C            Kirstin A. Shepperson, PA-C Josh [place], PA-C [place], [place]   RE: Susan Welch, [place]   [id number]      DOB: November 03, 1945 PROGRESS NOTE: 06-30-12 67 year old female with  bilateral knee end stage degenerative joint disease with increasing deformity positive rest pain night pain interferes with sleep symptoms are worse left greater than right. She has failed treatment with antiinflammatories rest and steroid injections. Current medications: Lisinopril, Mega Red, Citrocal and acetaminophen. No known drug allergies. Past surgical history significant for hysterectomy.  Past medical history: significant for hyperlipidemia, hypertension and panic attacks. Social history: she is divorced works full-time lives alone. She quit smoking 12 years ago and does not drink alcohol. Review of systems: significant for decreased vision wears glasses history of pneumonia and bronchitis history of anemia and easy bruising history of headaches and migraines and weight gain. No recent dizziness light headedness fever chills cardiac pulmonary GI GU or neuro issues.   EXAMINATION: Height 5'2" 153 pounds. BMI: 28. Blood pressure 158/59 pulse is 88 temp is 98.5. Alert and oriented no acute distress. He has Trendelenburg gait. Head is normal cephalic atraumatic. PERRLA. Extraocular motion is intact. Lungs clear to auscultation. Heart: regular rate and rhythm no murmur rubs or gallops. Abdomen is soft non-tender and non-distended. Exam of both knees show full extension 110 degrees of flexion 5 degrees varus  deformity bilaterally with varus thrust upon ambulation. Positive tibiofemoral crepitus otherwise neurovascularly intact. No effusion.   X-RAYS: Plain x-rays with sizing markers show end stage degenerative joint disease medial compartment with calcific loose bodies in the suprapatellar pouch of the right knee.  IMPRESSION: Left knee end stage degenerative joint disease.  PLAN: She wishes to proceed with left total knee replacement. Discussed risks benefits and possible complications and questions answered. He will follow-up post-op.  Loreta Ave, M.D.  Electronically verified by Loreta Ave, M.D. DFM(BR):kah D 07-13-12 T 07-13-12

## 2012-07-15 ENCOUNTER — Inpatient Hospital Stay (HOSPITAL_COMMUNITY)
Admission: RE | Admit: 2012-07-15 | Discharge: 2012-07-24 | DRG: 469 | Disposition: A | Discharge status: Rehabilitation Facility | Payer: Medicare Other | Source: Ambulatory Visit | Attending: Cardiothoracic Surgery | Admitting: Cardiothoracic Surgery

## 2012-07-15 ENCOUNTER — Encounter (HOSPITAL_COMMUNITY): Payer: Self-pay | Admitting: Anesthesiology

## 2012-07-15 ENCOUNTER — Ambulatory Visit (HOSPITAL_COMMUNITY): Payer: Medicare Other | Admitting: Anesthesiology

## 2012-07-15 ENCOUNTER — Encounter (HOSPITAL_COMMUNITY)
Admission: RE | Disposition: A | Discharge status: Rehabilitation Facility | Payer: Self-pay | Source: Ambulatory Visit | Attending: Orthopedic Surgery

## 2012-07-15 DIAGNOSIS — Z7901 Long term (current) use of anticoagulants: Secondary | ICD-10-CM

## 2012-07-15 DIAGNOSIS — Z79899 Other long term (current) drug therapy: Secondary | ICD-10-CM

## 2012-07-15 DIAGNOSIS — I2582 Chronic total occlusion of coronary artery: Secondary | ICD-10-CM | POA: Diagnosis present

## 2012-07-15 DIAGNOSIS — E8779 Other fluid overload: Secondary | ICD-10-CM | POA: Diagnosis not present

## 2012-07-15 DIAGNOSIS — Z602 Problems related to living alone: Secondary | ICD-10-CM

## 2012-07-15 DIAGNOSIS — I469 Cardiac arrest, cause unspecified: Secondary | ICD-10-CM | POA: Diagnosis not present

## 2012-07-15 DIAGNOSIS — I4901 Ventricular fibrillation: Secondary | ICD-10-CM

## 2012-07-15 DIAGNOSIS — Z8701 Personal history of pneumonia (recurrent): Secondary | ICD-10-CM

## 2012-07-15 DIAGNOSIS — Z87891 Personal history of nicotine dependence: Secondary | ICD-10-CM

## 2012-07-15 DIAGNOSIS — J9600 Acute respiratory failure, unspecified whether with hypoxia or hypercapnia: Secondary | ICD-10-CM | POA: Diagnosis not present

## 2012-07-15 DIAGNOSIS — E785 Hyperlipidemia, unspecified: Secondary | ICD-10-CM | POA: Diagnosis present

## 2012-07-15 DIAGNOSIS — Z96651 Presence of right artificial knee joint: Secondary | ICD-10-CM

## 2012-07-15 DIAGNOSIS — D62 Acute posthemorrhagic anemia: Secondary | ICD-10-CM | POA: Diagnosis not present

## 2012-07-15 DIAGNOSIS — I10 Essential (primary) hypertension: Secondary | ICD-10-CM | POA: Diagnosis present

## 2012-07-15 DIAGNOSIS — F410 Panic disorder [episodic paroxysmal anxiety] without agoraphobia: Secondary | ICD-10-CM | POA: Diagnosis present

## 2012-07-15 DIAGNOSIS — Z7982 Long term (current) use of aspirin: Secondary | ICD-10-CM

## 2012-07-15 DIAGNOSIS — E7800 Pure hypercholesterolemia, unspecified: Secondary | ICD-10-CM | POA: Diagnosis present

## 2012-07-15 DIAGNOSIS — Z951 Presence of aortocoronary bypass graft: Secondary | ICD-10-CM

## 2012-07-15 DIAGNOSIS — M1710 Unilateral primary osteoarthritis, unspecified knee: Principal | ICD-10-CM | POA: Diagnosis present

## 2012-07-15 DIAGNOSIS — D72829 Elevated white blood cell count, unspecified: Secondary | ICD-10-CM | POA: Diagnosis not present

## 2012-07-15 DIAGNOSIS — F411 Generalized anxiety disorder: Secondary | ICD-10-CM | POA: Diagnosis present

## 2012-07-15 DIAGNOSIS — I498 Other specified cardiac arrhythmias: Secondary | ICD-10-CM | POA: Diagnosis not present

## 2012-07-15 DIAGNOSIS — G43909 Migraine, unspecified, not intractable, without status migrainosus: Secondary | ICD-10-CM | POA: Diagnosis present

## 2012-07-15 DIAGNOSIS — I2510 Atherosclerotic heart disease of native coronary artery without angina pectoris: Secondary | ICD-10-CM

## 2012-07-15 DIAGNOSIS — M1711 Unilateral primary osteoarthritis, right knee: Secondary | ICD-10-CM | POA: Diagnosis present

## 2012-07-15 DIAGNOSIS — I214 Non-ST elevation (NSTEMI) myocardial infarction: Secondary | ICD-10-CM | POA: Diagnosis not present

## 2012-07-15 DIAGNOSIS — I2119 ST elevation (STEMI) myocardial infarction involving other coronary artery of inferior wall: Secondary | ICD-10-CM

## 2012-07-15 HISTORY — DX: Hyperlipidemia, unspecified: E78.5

## 2012-07-15 HISTORY — PX: TOTAL KNEE ARTHROPLASTY: SHX125

## 2012-07-15 HISTORY — DX: Panic disorder (episodic paroxysmal anxiety): F41.0

## 2012-07-15 HISTORY — DX: Ventricular fibrillation: I49.01

## 2012-07-15 LAB — CBC WITH DIFFERENTIAL/PLATELET
Basophils Relative: 0 % (ref 0–1)
HCT: 31.5 % — ABNORMAL LOW (ref 36.0–46.0)
Hemoglobin: 10.8 g/dL — ABNORMAL LOW (ref 12.0–15.0)
Lymphocytes Relative: 12 % (ref 12–46)
Lymphs Abs: 1.6 10*3/uL (ref 0.7–4.0)
Monocytes Absolute: 0.5 10*3/uL (ref 0.1–1.0)
Monocytes Relative: 4 % (ref 3–12)
Neutro Abs: 11 10*3/uL — ABNORMAL HIGH (ref 1.7–7.7)
Neutrophils Relative %: 83 % — ABNORMAL HIGH (ref 43–77)
RBC: 3.77 MIL/uL — ABNORMAL LOW (ref 3.87–5.11)
WBC: 13.2 10*3/uL — ABNORMAL HIGH (ref 4.0–10.5)

## 2012-07-15 LAB — APTT: aPTT: 30 seconds (ref 24–37)

## 2012-07-15 LAB — POCT I-STAT 7, (LYTES, BLD GAS, ICA,H+H)
Bicarbonate: 24.9 mEq/L — ABNORMAL HIGH (ref 20.0–24.0)
Hemoglobin: 10.9 g/dL — ABNORMAL LOW (ref 12.0–15.0)
O2 Saturation: 100 %
Sodium: 139 mEq/L (ref 135–145)
TCO2: 26 mmol/L (ref 0–100)
pCO2 arterial: 33.1 mmHg — ABNORMAL LOW (ref 35.0–45.0)
pH, Arterial: 7.484 — ABNORMAL HIGH (ref 7.350–7.450)

## 2012-07-15 LAB — PROTIME-INR
INR: 1.09 (ref 0.00–1.49)
Prothrombin Time: 13.9 seconds (ref 11.6–15.2)

## 2012-07-15 LAB — BASIC METABOLIC PANEL
CO2: 22 mEq/L (ref 19–32)
Chloride: 105 mEq/L (ref 96–112)
GFR calc non Af Amer: >90 mL/min (ref >90)
Glucose, Bld: 120 mg/dL — ABNORMAL HIGH (ref 70–99)
Potassium: 3.6 mEq/L (ref 3.5–5.1)
Sodium: 138 mEq/L (ref 135–145)

## 2012-07-15 LAB — HEMOGLOBIN A1C: Hgb A1c MFr Bld: 5.6 % (ref <5.7)

## 2012-07-15 SURGERY — ARTHROPLASTY, KNEE, TOTAL
Anesthesia: Regional | Site: Knee | Laterality: Right | Wound class: Other (see notes)

## 2012-07-15 MED ORDER — ACETAMINOPHEN 10 MG/ML IV SOLN
1000.0000 mg | Freq: Once | INTRAVENOUS | Status: AC | PRN
  Administered 2012-07-15: 1000 mg via INTRAVENOUS

## 2012-07-15 MED ORDER — ENOXAPARIN SODIUM 40 MG/0.4ML ~~LOC~~ SOLN
40.0000 mg | SUBCUTANEOUS | Status: DC
  Administered 2012-07-15 – 2012-07-20 (×6): 40 mg via SUBCUTANEOUS
  Filled 2012-07-15 (×7): qty 0.4

## 2012-07-15 MED ORDER — SODIUM CHLORIDE 0.9 % IJ SOLN: 3.0000 mL | INTRAMUSCULAR | Status: DC | PRN

## 2012-07-15 MED ORDER — LACTATED RINGERS IV SOLN: INTRAVENOUS | Status: DC

## 2012-07-15 MED ORDER — ACETAMINOPHEN 325 MG PO TABS: 650.0000 mg | ORAL_TABLET | ORAL | Status: DC | PRN

## 2012-07-15 MED ORDER — NITROGLYCERIN 0.4 MG SL SUBL: 0.4000 mg | SUBLINGUAL_TABLET | SUBLINGUAL | Status: DC | PRN

## 2012-07-15 MED ORDER — LIDOCAINE HCL (CARDIAC) 20 MG/ML IV SOLN
INTRAVENOUS | Status: DC | PRN
  Administered 2012-07-15: 100 mg via INTRAVENOUS

## 2012-07-15 MED ORDER — MIDAZOLAM HCL 5 MG/5ML IJ SOLN
INTRAMUSCULAR | Status: DC | PRN
  Administered 2012-07-15 (×2): 2 mg via INTRAVENOUS

## 2012-07-15 MED ORDER — METOPROLOL TARTRATE 12.5 MG HALF TABLET
12.5000 mg | ORAL_TABLET | Freq: Two times a day (BID) | ORAL | Status: DC
  Administered 2012-07-15 – 2012-07-21 (×12): 12.5 mg via ORAL
  Filled 2012-07-15 (×14): qty 1

## 2012-07-15 MED ORDER — ONDANSETRON HCL 4 MG/2ML IJ SOLN
INTRAMUSCULAR | Status: DC | PRN
  Administered 2012-07-15: 4 mg via INTRAVENOUS

## 2012-07-15 MED ORDER — SODIUM CHLORIDE 0.9 % IJ SOLN
3.0000 mL | Freq: Two times a day (BID) | INTRAMUSCULAR | Status: DC
  Administered 2012-07-15 – 2012-07-20 (×10): 3 mL via INTRAVENOUS

## 2012-07-15 MED ORDER — HYDROMORPHONE HCL PF 1 MG/ML IJ SOLN
0.5000 mg | INTRAMUSCULAR | Status: DC | PRN
Start: 2012-07-15 — End: 2012-07-21
  Administered 2012-07-15 – 2012-07-20 (×16): 0.5 mg via INTRAVENOUS
  Filled 2012-07-15 (×16): qty 1

## 2012-07-15 MED ORDER — OXYCODONE-ACETAMINOPHEN 5-325 MG PO TABS
1.0000 | ORAL_TABLET | ORAL | Status: DC | PRN
  Administered 2012-07-15 – 2012-07-20 (×20): 2 via ORAL
  Filled 2012-07-15 (×21): qty 2

## 2012-07-15 MED ORDER — LISINOPRIL 20 MG PO TABS
20.0000 mg | ORAL_TABLET | Freq: Every day | ORAL | Status: DC
  Administered 2012-07-15 – 2012-07-18 (×4): 20 mg via ORAL
  Filled 2012-07-15 (×5): qty 1

## 2012-07-15 MED ORDER — ATROPINE SULFATE 1 MG/ML IJ SOLN
INTRAMUSCULAR | Status: DC | PRN
  Administered 2012-07-15: 1 mg via INTRAVENOUS

## 2012-07-15 MED ORDER — HYDROMORPHONE HCL PF 1 MG/ML IJ SOLN
0.2500 mg | INTRAMUSCULAR | Status: DC | PRN
  Administered 2012-07-15 (×4): 0.5 mg via INTRAVENOUS

## 2012-07-15 MED ORDER — LISINOPRIL-HYDROCHLOROTHIAZIDE 20-25 MG PO TABS: 1.0000 | ORAL_TABLET | Freq: Every day | ORAL | Status: DC

## 2012-07-15 MED ORDER — HYDROMORPHONE HCL PF 1 MG/ML IJ SOLN
INTRAMUSCULAR | Status: AC
  Filled 2012-07-15: qty 1

## 2012-07-15 MED ORDER — FENTANYL CITRATE 0.05 MG/ML IJ SOLN
INTRAMUSCULAR | Status: DC | PRN
  Administered 2012-07-15 (×5): 50 ug via INTRAVENOUS

## 2012-07-15 MED ORDER — BUPIVACAINE HCL (PF) 0.25 % IJ SOLN
INTRAMUSCULAR | Status: AC
  Filled 2012-07-15: qty 30

## 2012-07-15 MED ORDER — BUPIVACAINE LIPOSOME 1.3 % IJ SUSP
20.0000 mL | Freq: Once | INTRAMUSCULAR | Status: AC
  Administered 2012-07-15: 266 mg
  Filled 2012-07-15: qty 20

## 2012-07-15 MED ORDER — SODIUM CHLORIDE 0.9 % IR SOLN
Status: DC | PRN
  Administered 2012-07-15 (×2): 3000 mL

## 2012-07-15 MED ORDER — ASPIRIN 81 MG PO CHEW
324.0000 mg | CHEWABLE_TABLET | ORAL | Status: AC
  Administered 2012-07-15: 324 mg via ORAL
  Filled 2012-07-15: qty 4

## 2012-07-15 MED ORDER — ASPIRIN EC 81 MG PO TBEC
81.0000 mg | DELAYED_RELEASE_TABLET | Freq: Every day | ORAL | Status: DC
  Administered 2012-07-16 – 2012-07-20 (×5): 81 mg via ORAL
  Filled 2012-07-15 (×6): qty 1

## 2012-07-15 MED ORDER — ONDANSETRON HCL 4 MG/2ML IJ SOLN: 4.0000 mg | Freq: Four times a day (QID) | INTRAMUSCULAR | Status: DC | PRN

## 2012-07-15 MED ORDER — LACTATED RINGERS IV SOLN
INTRAVENOUS | Status: DC | PRN
  Administered 2012-07-15 (×2): via INTRAVENOUS

## 2012-07-15 MED ORDER — CHLORHEXIDINE GLUCONATE 4 % EX LIQD: 60.0000 mL | Freq: Once | CUTANEOUS | Status: DC

## 2012-07-15 MED ORDER — CLOPIDOGREL BISULFATE 300 MG PO TABS
300.0000 mg | ORAL_TABLET | Freq: Once | ORAL | Status: AC
  Administered 2012-07-15: 300 mg via ORAL
  Filled 2012-07-15: qty 1

## 2012-07-15 MED ORDER — PROPOFOL 10 MG/ML IV BOLUS
INTRAVENOUS | Status: DC | PRN
  Administered 2012-07-15: 50 mg via INTRAVENOUS
  Administered 2012-07-15: 160 mg via INTRAVENOUS
  Administered 2012-07-15: 40 mg via INTRAVENOUS

## 2012-07-15 MED ORDER — FAT EMULSION 20 % IV EMUL
250.0000 mL | INTRAVENOUS | Status: DC
  Filled 2012-07-15: qty 250

## 2012-07-15 MED ORDER — ARTIFICIAL TEARS OP OINT
TOPICAL_OINTMENT | OPHTHALMIC | Status: DC | PRN
  Administered 2012-07-15: 1 via OPHTHALMIC

## 2012-07-15 MED ORDER — ACETAMINOPHEN 10 MG/ML IV SOLN
INTRAVENOUS | Status: AC
  Filled 2012-07-15: qty 100

## 2012-07-15 MED ORDER — ROCURONIUM BROMIDE 100 MG/10ML IV SOLN
INTRAVENOUS | Status: DC | PRN
  Administered 2012-07-15: 40 mg via INTRAVENOUS

## 2012-07-15 MED ORDER — ASPIRIN 300 MG RE SUPP
300.0000 mg | RECTAL | Status: AC
  Filled 2012-07-15: qty 1

## 2012-07-15 MED ORDER — CLOPIDOGREL BISULFATE 75 MG PO TABS
75.0000 mg | ORAL_TABLET | Freq: Every day | ORAL | Status: DC
  Administered 2012-07-16 – 2012-07-17 (×2): 75 mg via ORAL
  Filled 2012-07-15 (×4): qty 1

## 2012-07-15 MED ORDER — SODIUM CHLORIDE 0.9 % IV SOLN
250.0000 mL | INTRAVENOUS | Status: DC | PRN
  Administered 2012-07-17: 21:00:00 via INTRAVENOUS

## 2012-07-15 MED ORDER — ONDANSETRON HCL 4 MG/2ML IJ SOLN: 4.0000 mg | Freq: Once | INTRAMUSCULAR | Status: DC | PRN

## 2012-07-15 MED ORDER — HYDROCHLOROTHIAZIDE 25 MG PO TABS
25.0000 mg | ORAL_TABLET | Freq: Every day | ORAL | Status: DC
  Administered 2012-07-15 – 2012-07-18 (×4): 25 mg via ORAL
  Filled 2012-07-15 (×5): qty 1

## 2012-07-15 SURGICAL SUPPLY — 62 items
BANDAGE ESMARK 6X9 LF (GAUZE/BANDAGES/DRESSINGS) ×1 IMPLANT
BLADE SAG 18X100X1.27 (BLADE) ×4 IMPLANT
BNDG ESMARK 6X9 LF (GAUZE/BANDAGES/DRESSINGS) ×2
BOWL SMART MIX CTS (DISPOSABLE) ×2 IMPLANT
CEMENT BONE SIMPLEX SPEEDSET (Cement) ×4 IMPLANT
CLOTH BEACON ORANGE TIMEOUT ST (SAFETY) ×2 IMPLANT
COVER SURGICAL LIGHT HANDLE (MISCELLANEOUS) ×2 IMPLANT
CUFF TOURNIQUET SINGLE 34IN LL (TOURNIQUET CUFF) ×2 IMPLANT
DRAPE EXTREMITY T 121X128X90 (DRAPE) ×4 IMPLANT
DRAPE PROXIMA HALF (DRAPES) ×2 IMPLANT
DRAPE U-SHAPE 47X51 STRL (DRAPES) ×2 IMPLANT
DRSG ADAPTIC 3X8 NADH LF (GAUZE/BANDAGES/DRESSINGS) ×2 IMPLANT
DRSG PAD ABDOMINAL 8X10 ST (GAUZE/BANDAGES/DRESSINGS) ×2 IMPLANT
DURAPREP 26ML APPLICATOR (WOUND CARE) ×2 IMPLANT
ELECT CAUTERY BLADE 6.4 (BLADE) ×2 IMPLANT
ELECT REM PT RETURN 9FT ADLT (ELECTROSURGICAL) ×2
ELECTRODE REM PT RTRN 9FT ADLT (ELECTROSURGICAL) ×1 IMPLANT
EVACUATOR 1/8 PVC DRAIN (DRAIN) ×2 IMPLANT
FACESHIELD LNG OPTICON STERILE (SAFETY) ×2 IMPLANT
GAUZE XEROFORM 5X9 LF (GAUZE/BANDAGES/DRESSINGS) ×2 IMPLANT
GLOVE BIOGEL PI IND STRL 6.5 (GLOVE) ×2 IMPLANT
GLOVE BIOGEL PI IND STRL 7.0 (GLOVE) ×4 IMPLANT
GLOVE BIOGEL PI INDICATOR 6.5 (GLOVE) ×2
GLOVE BIOGEL PI INDICATOR 7.0 (GLOVE) ×4
GLOVE ORTHO TXT STRL SZ7.5 (GLOVE) ×4 IMPLANT
GLOVE SURG SS PI 6.5 STRL IVOR (GLOVE) ×4 IMPLANT
GOWN PREVENTION PLUS XLARGE (GOWN DISPOSABLE) ×4 IMPLANT
GOWN STRL NON-REIN LRG LVL3 (GOWN DISPOSABLE) ×4 IMPLANT
GOWN STRL REIN 2XL XLG LVL4 (GOWN DISPOSABLE) ×6 IMPLANT
HANDPIECE INTERPULSE COAX TIP (DISPOSABLE) ×1
HOOD PEEL AWAY FACE SHEILD DIS (HOOD) ×2 IMPLANT
IMMOBILIZER KNEE 22 UNIV (SOFTGOODS) ×2 IMPLANT
IMMOBILIZER KNEE 24 THIGH 36 (MISCELLANEOUS) IMPLANT
IMMOBILIZER KNEE 24 UNIV (MISCELLANEOUS)
KIT BASIN OR (CUSTOM PROCEDURE TRAY) ×2 IMPLANT
KIT ROOM TURNOVER OR (KITS) ×2 IMPLANT
KNEE GII NONPOR PS W/CEM HIFLX ×2 IMPLANT
MANIFOLD NEPTUNE II (INSTRUMENTS) ×2 IMPLANT
NEEDLE 18GX1X1/2 (RX/OR ONLY) (NEEDLE) ×2 IMPLANT
NEEDLE HYPO 25GX1X1/2 BEV (NEEDLE) ×2 IMPLANT
NS IRRIG 1000ML POUR BTL (IV SOLUTION) ×2 IMPLANT
PACK TOTAL JOINT (CUSTOM PROCEDURE TRAY) ×2 IMPLANT
PAD ARMBOARD 7.5X6 YLW CONV (MISCELLANEOUS) ×4 IMPLANT
PAD CAST 4YDX4 CTTN HI CHSV (CAST SUPPLIES) ×1 IMPLANT
PADDING CAST COTTON 4X4 STRL (CAST SUPPLIES) ×1
PADDING CAST COTTON 6X4 STRL (CAST SUPPLIES) ×2 IMPLANT
PINS ×2 IMPLANT
RUBBERBAND STERILE (MISCELLANEOUS) ×2 IMPLANT
SET HNDPC FAN SPRY TIP SCT (DISPOSABLE) ×1 IMPLANT
SPEED PIN ×2 IMPLANT
SPONGE GAUZE 4X4 12PLY (GAUZE/BANDAGES/DRESSINGS) ×2 IMPLANT
STAPLER VISISTAT 35W (STAPLE) ×2 IMPLANT
SUCTION FRAZIER TIP 10 FR DISP (SUCTIONS) ×2 IMPLANT
SUT VIC AB 1 CTX 36 (SUTURE) ×2
SUT VIC AB 1 CTX36XBRD ANBCTR (SUTURE) ×2 IMPLANT
SUT VIC AB 2-0 CT1 27 (SUTURE) ×2
SUT VIC AB 2-0 CT1 TAPERPNT 27 (SUTURE) ×2 IMPLANT
SYR CONTROL 10ML LL (SYRINGE) ×2 IMPLANT
TOWEL OR 17X24 6PK STRL BLUE (TOWEL DISPOSABLE) ×2 IMPLANT
TOWEL OR 17X26 10 PK STRL BLUE (TOWEL DISPOSABLE) ×2 IMPLANT
TRAY FOLEY CATH 14FR (SET/KITS/TRAYS/PACK) ×2 IMPLANT
WATER STERILE IRR 1000ML POUR (IV SOLUTION) ×4 IMPLANT

## 2012-07-15 NOTE — Progress Notes (Signed)
Pt. Arrives to floor from PACU via bed. Accompanied by RN. Stable, Alert and Orientedx3. SR with occational PVC's HR 80's. NIBP 140's/80's. Left Brachial A-line leveled and zeroed. Oxygen level 100% on 2LNC. Lungs clear and diminished in bases. Bowel sounds active X4Q. FC in place and draining appropriate amount of c,y urine. Right leg brace in place s/p sx with ace bandage cdi. Hemovac in place and charged for draining- sanguanous fluid noted Pt. States no pain at this time. Oriented to room and environment. Updated on pt. Status and POC. Agreeable and questions answered. Cal light within reach and pt. Reinforced to call with any additional needs/concerns. Will continue to monitor, educate, assess closely. Gildardo Pounds

## 2012-07-15 NOTE — Interval H&P Note (Signed)
History and Physical Interval Note:  07/15/2012 8:10 AM  Susan Welch  has presented today for surgery, with the diagnosis of DJD RT KNEE  The various methods of treatment have been discussed with the patient and family. After consideration of risks, benefits and other options for treatment, the patient has consented to  Procedure(s): TOTAL KNEE ARTHROPLASTY (Right) as a surgical intervention .  The patient's history has been reviewed, patient examined, no change in status, stable for surgery.  I have reviewed the patient's chart and labs.  Questions were answered to the patient's satisfaction.     Sharnelle Cappelli F

## 2012-07-15 NOTE — Progress Notes (Signed)
Anesthesiology Note:  Susan Welch is a 67 year old female with a history of hypertension and hyperlipidemia who suffered a cardiac arrest while undergoing left total knee replacement by Dr. Mckinley Jewel. Preoperatively a saphenous nerve block was performed using 25 cc of 0.5% bupivacaine with 1-200 epinephrine. the patient was then brought to the operating room and general anesthesia was induced with propofol, lidocaine and fentanyl. An LMA was inserted. Approximately 25 minutes after induction, the patient developed ventricular fibrillation. I was called into the room and chest compressions were begun  immediately.  One shock with 120 J was administered and the patient converted to sinus rhythm. The patient was also given 1 mg of atropine for bradycardia immediately post shock. No epinephrine was administered.  For the remainder of the time in the operating room, she remain hemodynamically stable. An  arterial line was inserted. ABG showed  pH 7.48 PCO2 33 PO2 is 364 with potassium of 3.2 and H&H of 10.9 and 32  Cardiology was then consulted and I spoke with Dr. Juanito Doom.  Immediately following surgery in the operating room, a transesophageal echocardiography  was performed by me. This revealed akinesis of the interventricular septum and inferior wall. The ejection fraction was estimated at 40%. There was contractility of the lateral wall and anterior walls well. She was then brought to the recovery room and extubated.   In the recovery room  the 12-lead ECG showed small Q wave in lead 3 and anterior-lateral ST-T changes but no ST elevation. Troponin I was 4.0  In PACU following extubation she was awake and alert, complaining of L. Knee pain but no chest pain or SOB.  VS: T-36.6 BP 147/76 HR 95 (SR)  RR 15 O2 Sat 100% 0n 2L [place] [facility] RRR, No Murmurs  Lungs- Clear  Impression: Intra-operative ventricular fibrillation due to acute non-stemi most likely involving the RCA distribution. She is now  clinically stable.  Plan: Transfer to 2309 1. Cardiology to manage acute coronary syndrome   2. Plavix aspirin given,  3. Serial ECGs and enzymes ordered.  4. Cardiac cath planned when patient can tolerate full anti-coagulation.  Kipp Brood, MD

## 2012-07-15 NOTE — Anesthesia Postprocedure Evaluation (Signed)
  Anesthesia Post-op Note  Patient: Susan Welch  Procedure(s) Performed: Procedure(s) with comments: TOTAL KNEE ARTHROPLASTY (Right) - drapes pulled back to provide cpr, new drape applied, protocol followed  Patient Location: PACU  Anesthesia Type:General  Level of Consciousness: awake, alert  and oriented  Airway and Oxygen Therapy: Patient Spontanous Breathing and Patient connected to nasal cannula oxygen  Post-op Pain: mild  Post-op Assessment: Post-op Vital signs reviewed, Patient's Cardiovascular Status Stable, Respiratory Function Stable and Pain level controlled  Post-op Vital Signs: stable  Complications: Intra-operative MI See progress notes

## 2012-07-15 NOTE — Preoperative (Signed)
Beta Blockers   Reason not to administer Beta Blockers:Not Applicable 

## 2012-07-15 NOTE — Transfer of Care (Signed)
Immediate Anesthesia Transfer of Care Note  Patient: Nickolette Espinola  Procedure(s) Performed: Procedure(s) with comments: TOTAL KNEE ARTHROPLASTY (Right) - drapes pulled back to provide cpr, new drape applied, protocol followed  Patient Location: PACU  Anesthesia Type:General and Regional    Level of Consciousness: Pt on vent post op/prob cardiac event intra-op  Airway & Oxygen Therapy: Patient placed on Ventilator (see vital sign flow sheet for setting)  Post-op Assessment: Report given to PACU RN and Post -op Vital signs reviewed and stableVSS at present/report given/probable intraop cardiac event/cardiologist at bedside  Post vital signs: Reviewed and stable VSS at present  Complications: cardiovascular complications and probable intra-op cardiac event

## 2012-07-15 NOTE — Anesthesia Preprocedure Evaluation (Signed)
Anesthesia Evaluation  Patient identified by MRN, date of birth, ID band Patient awake    Reviewed: Allergy & Precautions, H&P , NPO status , Patient's Chart, lab work & pertinent test results  Airway Mallampati: II      Dental  (+) Teeth Intact and Dental Advisory Given   Pulmonary  breath sounds clear to auscultation        Cardiovascular Rhythm:Regular Rate:Normal     Neuro/Psych    GI/Hepatic   Endo/Other    Renal/GU      Musculoskeletal   Abdominal   Peds  Hematology   Anesthesia Other Findings   Reproductive/Obstetrics                           Anesthesia Physical Anesthesia Plan  ASA: II  Anesthesia Plan: General   Post-op Pain Management:    Induction: Intravenous  Airway Management Planned: LMA  Additional Equipment:   Intra-op Plan:   Post-operative Plan: Extubation in OR  Informed Consent: I have reviewed the patients History and Physical, chart, labs and discussed the procedure including the risks, benefits and alternatives for the proposed anesthesia with the patient or authorized representative who has indicated his/her understanding and acceptance.   Dental advisory given  Plan Discussed with: CRNA and Anesthesiologist  Anesthesia Plan Comments:         Anesthesia Quick Evaluation

## 2012-07-15 NOTE — Consult Note (Addendum)
Patient ID: Susan Welch MRN: [id number], DOB/AGE: 1945/06/19   Admit date: 07/15/2012  Primary Physician: Dow Adolph, MD Primary Cardiologist: new - seen by T. Toby Breithaupt, MD   Pt. Profile:  67 y/o female without prior cardiac hx whom we've been asked to see 2/2 VF arrest intraop-R TKA this AM.  Problem List  Past Medical History  Diagnosis Date  . Arthritis     a. bilat knees s/p r TKA 07/15/2012  . Anemia   . Hypertension   . Anxiety     Hx: of situational anxiety  . Headache(784.0)     Hx: of migraines  . Hyperlipidemia   . Panic attacks     Past Surgical History  Procedure Laterality Date  . Abdominal hysterectomy    . Dilation and curettage of uterus      Allergies  Allergies  Allergen Reactions  . Statins Other (See Comments)    Joint pain  . Nickel Rash   HPI  67 y/o female without prior known cardiac hx.  She is currently intubated and sedated and thus hx is obtained from prior records and staff reports.  She does have a h/o HTN and hyperlipidemia according to H & P.  She also has a h/o L>R bilat knee arthritis and today presented for right total knee arhtroplasty.  Following induction and upon incision, patient went into VF.  She was quickly attended to and within two minutes was defibrillated and back in sinus rhythm.  The case proceeded without further complication, arrythmia, or hemodynamic compromise.  TEE was carried out by anesthesia and showed LV dysfxn, EF ~ 40%, with an inferoseptal Mollee Neer motion abnormality.  We were called for consult.  Pt just came out to PACU.  She remains intubated/sedated.  ECG just performed and shows Q's in III with twi in III and aVF as well as anterolateral t changes and inversion.  Home Medications  Prior to Admission medications   Medication Sig Start Date End Date Taking? Authorizing Provider  acetaminophen (TYLENOL) 500 MG tablet Take 1,000 mg by mouth daily.   Yes Historical Provider, MD  calcium citrate-vitamin D  (CITRACAL+D) 315-200 MG-UNIT per tablet Take 1 tablet by mouth daily.   Yes Historical Provider, MD  lisinopril-hydrochlorothiazide (PRINZIDE,ZESTORETIC) 20-25 MG per tablet Take 1 tablet by mouth daily. 09/20/11 09/19/12 Yes Jonita Albee, MD  OVER THE COUNTER MEDICATION Take 500 mg by mouth daily.   Yes Historical Provider, MD   Family History - per prior records.  Family History  Problem Relation Age of Onset  . Thyroid disease Mother     has thyroid removed   Social History - per prior records.  History   Social History  . Marital Status: Widowed    Spouse Name: N/A    Number of Children: N/A  . Years of Education: N/A   Occupational History  . admin assistant    Social History Main Topics  . Smoking status: Former Smoker    Quit date: 01/08/2000  . Smokeless tobacco: Not on file  . Alcohol Use: Yes     Comment: "occasional wine"  . Drug Use: No  . Sexually Active: Not Currently   Other Topics Concern  . Not on file   Social History Narrative   Divorced and lives alone.  Works full time.    Review of Systems - unable to obtain secondary intubated/sedated status.  Physical Exam  Blood pressure 155/78, pulse 86, temperature 98.4 F (36.9 C), temperature source Oral, resp.  rate 20, SpO2 97.00%.  General: Intubated/sedated Psych: Intubated/sedated Neuro: Intubated/sedated HEENT: Normal  Neck: Supple without bruits or JVD. Lungs:  Resp regular and unlabored, diminished breath sounds bilat. Heart: RRR no s3, s4, or murmurs. Abdomen: Soft, non-tender, non-distended, BS + x 4.  Extremities: No clubbing, cyanosis or edema. DP/PT/Radials 2+ and equal bilaterally.  Labs   Lab Results  Component Value Date   WBC 9.0 07/07/2012   HGB 13.5 07/07/2012   HCT 38.9 07/07/2012   MCV 85.7 07/07/2012   PLT 342 07/07/2012   Lab Results  Component Value Date   CHOL 260* 04/03/2012   HDL 55 04/03/2012   LDLCALC 173* 04/03/2012   TRIG 162* 04/03/2012   Radiology/Studies  Dg  Chest 2 View  07/07/2012   *RADIOLOGY REPORT*  Clinical Data: Preoperative evaluation for total knee replacement, history of hypertension  CHEST - 2 VIEW  Comparison: None.  Findings: The heart and pulmonary vascularity are within normal limits.  The lungs are hyperinflated without focal infiltrate.  No acute bony abnormality is seen.  IMPRESSION: COPD without acute abnormality.   Original Report Authenticated By: Alcide Clever, M.D.   ECG  Rsr, 81, inf, new antlat st/t changes concerning for ischemia.  ASSESSMENT AND PLAN  1.  VF arrest/presumably acute inferior MI:  Pt suffered VF arrest intraoperatively today.  She was quickly defibrillated with resumption of sinus rhythm.  Case was carried out to completion and we've been asked to eval.  She remains intubated and sedated.  Intra-op TEE shows inferoseptal WMA with lv dysfxn.  ECG now shows inferior and anterolateral ST/T changes, which are new.  There is no ST elevation.  We've discussed her situation with Dr. Eulah Pont and she is not able to be fully anticoagulated @ this time.  ASA/Plavix ok.  This being the case, and in the absence of ST elevation, we will manage conservatively for the time being with plan to defer to cath until she can be fully anticoagulated.  Place NGT.  Will load with asa 325, plavix 300, add bb.  She is intolerant to statins.  2.  HTN:  Follow.  3.  HL:  Check lipids lft's.  She is intolerant to statins.  4.  Acute Resp Failure/VDRF:  Anesthesia to extubate.  5.  OA: s/p r TKA.  Per ortho.   Signed, Nicolasa Ducking, NP 07/15/2012, 12:30 PM I have taken a history, reviewed medications, allergies, PMH, SH, FH, and reviewed ROS and examined the patient.  I agree with the assessment and plan. Discussed with Dr Eulah Pont x 2. Plan on cath >48hrs post surgery to decrease risk of hemarthrosis.  Charrise Lardner C. Daleen Squibb, MD, [facility] Pager:  [phone]

## 2012-07-15 NOTE — Progress Notes (Signed)
  Echocardiogram Echocardiogram Transesophageal has been performed.  Susan Welch 07/15/2012, 12:53 PM

## 2012-07-15 NOTE — OR Nursing (Signed)
Tee performed by Dr. Noreene Larsson and Dr  Dory Peru  At [phone]

## 2012-07-15 NOTE — CV Procedure (Signed)
Transesophageal echocardiography report:  Susan Welch is a 67 year-old female who suffered a cardiac arrest with ventricular fibrillation while undergoing left total knee replacement by Dr. Eulah Pont. She was defibrillated x1 with 120 J and returned to sinus rhythm. Following the surgery, while she was still under general endotracheal anesthesia, the transesophageal echocardiography was performed to  to help determine the etiology of her cardiac arrest.  Following orogastric suctioning, the transesophageal echocardiography probe was inserted into the esophagus without difficulty.  Impression:  1. Left ventricle: There was akinesis of the interventricular septum and inferior wall which was evident in the transgastric short axis view as well as the mid-esophageal 4 chamber and mid-esophageal 2 chamber views. The ejection fraction was estimated at 40%. The left ventricular cavity was not enlarged. There was no obvious thinning or aneurysmal dilatation of the inferior wall or the interventricular septum.  2. Aortic valve: The aortic valve was tri-leaflet. The leaflets opened normally and there was no aortic insufficiency.  3.  Mitral valve: The mitral valve appeared structurally unremarkable and there  was trace mitral insufficiency.  4. Ascending aorta: Was no dissection of the ascending aorta that could be appreciated.  5. Pericardium. There was no pericardial effusion that could be appreciated.  6. Right ventricle: The right ventricular cavity was normal in size with vigorous contractility the right ventricular free wall.  7. Tricuspid valve: There was trace tricuspid insufficiency and the leaflets appeared structurally normal.  Kipp Brood, M.D.

## 2012-07-16 ENCOUNTER — Inpatient Hospital Stay (HOSPITAL_COMMUNITY): Payer: Medicare Other

## 2012-07-16 ENCOUNTER — Encounter (HOSPITAL_COMMUNITY): Payer: Self-pay | Admitting: Orthopedic Surgery

## 2012-07-16 DIAGNOSIS — M1711 Unilateral primary osteoarthritis, right knee: Secondary | ICD-10-CM

## 2012-07-16 HISTORY — DX: Unilateral primary osteoarthritis, right knee: M17.11

## 2012-07-16 LAB — CBC
HCT: 29.9 % — ABNORMAL LOW (ref 36.0–46.0)
Hemoglobin: 10.2 g/dL — ABNORMAL LOW (ref 12.0–15.0)
MCHC: 34.1 g/dL (ref 30.0–36.0)
MCV: 85.4 fL (ref 78.0–100.0)
RDW: 13.4 % (ref 11.5–15.5)

## 2012-07-16 LAB — LIPID PANEL
Cholesterol: 207 mg/dL — ABNORMAL HIGH (ref 0–200)
LDL Cholesterol: 127 mg/dL — ABNORMAL HIGH (ref 0–99)
Total CHOL/HDL Ratio: 4.1 RATIO
Triglycerides: 149 mg/dL (ref <150)
VLDL: 30 mg/dL (ref 0–40)

## 2012-07-16 LAB — BASIC METABOLIC PANEL
BUN: 12 mg/dL (ref 6–23)
Chloride: 100 mEq/L (ref 96–112)
Creatinine, Ser: 0.54 mg/dL (ref 0.50–1.10)
GFR calc non Af Amer: >90 mL/min (ref >90)
Glucose, Bld: 128 mg/dL — ABNORMAL HIGH (ref 70–99)
Potassium: 3.9 mEq/L (ref 3.5–5.1)

## 2012-07-16 MED ORDER — SODIUM CHLORIDE 0.9 % IV SOLN: 250.0000 mL | INTRAVENOUS | Status: DC | PRN

## 2012-07-16 MED ORDER — DIAZEPAM 5 MG PO TABS
5.0000 mg | ORAL_TABLET | ORAL | Status: AC
  Administered 2012-07-17: 5 mg via ORAL
  Filled 2012-07-16: qty 1

## 2012-07-16 MED ORDER — METOPROLOL TARTRATE 1 MG/ML IV SOLN
INTRAVENOUS | Status: DC | PRN
  Administered 2012-07-15: 2 mg via INTRAVENOUS

## 2012-07-16 MED ORDER — SODIUM CHLORIDE 0.9 % IJ SOLN: 3.0000 mL | INTRAMUSCULAR | Status: DC | PRN

## 2012-07-16 MED ORDER — SODIUM CHLORIDE 0.9 % IJ SOLN
3.0000 mL | Freq: Two times a day (BID) | INTRAMUSCULAR | Status: DC
  Administered 2012-07-16: 3 mL via INTRAVENOUS

## 2012-07-16 NOTE — Progress Notes (Signed)
UR Completed.  Julann Mcgilvray Jane [phone] 07/16/2012  

## 2012-07-16 NOTE — Progress Notes (Signed)
Pt's 2nd troponin was 13.06. Pt asymptomatic. No pain except for right knee surgical pain. MD on call notified of elevated troponin. 12 lead EKG done per order. MD reviewed EKG and gave no new orders. Continue current treatment plan. Will continue to monitor.  Alfonso Ellis, RN

## 2012-07-16 NOTE — Progress Notes (Signed)
Patient ID: Susan Welch, female   DOB: 12-11-1945, 67 y.o.   MRN: [id number]   Patient Name: Susan Welch Date of Encounter: 07/16/2012    SUBJECTIVE  No chest pain or shortness of breath this morning. She's not it is sore from the defibrillation. Cardiac enzymes consistent with non-STEMI. Vital signs stable.  CURRENT MEDS . aspirin EC  81 mg Oral Daily  . clopidogrel  75 mg Oral Q breakfast  . enoxaparin (LOVENOX) injection  40 mg Subcutaneous Q24H  . lisinopril  20 mg Oral Daily   And  . hydrochlorothiazide  25 mg Oral Daily  . metoprolol tartrate  12.5 mg Oral BID  . sodium chloride  3 mL Intravenous Q12H    OBJECTIVE  Filed Vitals:   07/16/12 0600 07/16/12 0615 07/16/12 0700 07/16/12 0756  BP: 116/61  117/64   Pulse: 83  96   Temp:    97.8 F (36.6 C)  TempSrc:    Oral  Resp: 12  14   Height:  5\' 2"  (1.575 m)    Weight:  168 lb 6.9 oz (76.4 kg)    SpO2: 96%  99%     Intake/Output Summary (Last 24 hours) at 07/16/12 0833 Last data filed at 07/16/12 0700  Gross per 24 hour  Intake   2200 ml  Output   2815 ml  Net   -615 ml   Filed Weights   07/16/12 0615  Weight: 168 lb 6.9 oz (76.4 kg)    PHYSICAL EXAM  General: Pleasant, NAD. Neuro: Alert and oriented X 3. Moves all extremities spontaneously. Psych: Normal affect. HEENT:  Normal  Neck: Supple without bruits or JVD. Lungs:  Resp regular and unlabored, CTA. Heart: RRR no s3, s4, or murmurs. Abdomen: Soft, non-tender, non-distended, BS + x 4.  Extremities: No clubbing, cyanosis or edema. DP/PT/Radials 2+ and equal bilaterally.  Accessory Clinical Findings  CBC  Recent Labs  07/15/12 1141 07/15/12 1425 07/16/12 0430  WBC  --  13.2* 10.6*  NEUTROABS  --  11.0*  --   HGB 10.9* 10.8* 10.2*  HCT 32.0* 31.5* 29.9*  MCV  --  83.6 85.4  PLT  --  252 260   Basic Metabolic Panel  Recent Labs  07/15/12 1141 07/15/12 1425 07/16/12 0430  NA 139 138 136  K 3.2* 3.6 3.9  CL  --  105 100  CO2   --  22 25  GLUCOSE  --  120* 128*  BUN  --  18 12  CREATININE  --  0.51 0.54  CALCIUM  --  8.3* 8.4  MG  --  1.5  --    Liver Function Tests No results found for this basename: AST, ALT, ALKPHOS, BILITOT, PROT, ALBUMIN,  in the last 72 hours No results found for this basename: LIPASE, AMYLASE,  in the last 72 hours Cardiac Enzymes  Recent Labs  07/15/12 1423 07/15/12 2217 07/16/12 0430  TROPONINI 4.00* 13.06* 12.61*   BNP No components found with this basename: POCBNP,  D-Dimer No results found for this basename: DDIMER,  in the last 72 hours Hemoglobin A1C  Recent Labs  07/15/12 1750  HGBA1C 5.6   Fasting Lipid Panel  Recent Labs  07/16/12 0430  CHOL 207*  HDL 50  LDLCALC 127*  TRIG 149  CHOLHDL 4.1   Thyroid Function Tests  Recent Labs  07/15/12 1750  TSH 1.789    TELE  Normal sinus rhythm  ECG   T-wave inversion inferiorly as well  as anterolaterally.  Radiology/Studies  Dg Chest 2 View  07/07/2012   *RADIOLOGY REPORT*  Clinical Data: Preoperative evaluation for total knee replacement, history of hypertension  CHEST - 2 VIEW  Comparison: None.  Findings: The heart and pulmonary vascularity are within normal limits.  The lungs are hyperinflated without focal infiltrate.  No acute bony abnormality is seen.  IMPRESSION: COPD without acute abnormality.   Original Report Authenticated By: Alcide Clever, M.D.    ASSESSMENT AND PLAN   #1 intraoperative non-STEMI. No history of coronary artery disease but does have risk factors. Echocardiogram in the OR suggested inferior septal severe hypokinesia.  She's currently stable. We are avoiding systemic anticoagulation as discussed with Dr. Eulah Pont to avoid bleeding into the knee. When he feels weak and proceed with cardiac catheterization, will proceed. Possibly tomorrow afternoon. She is stable and there is no reason to hurry and less she begins to have symptoms or more worrisome EKG changes. I discussed with her  at length. Dr. Eulah Pont has talked to her son. Family not here  this morning. Signed, Valera Castle MD

## 2012-07-16 NOTE — Progress Notes (Signed)
RN called, Re: Troponin trending up to 13 in the setting of VF arrest and intraoperative MI.  12 ECG obtained, show no ST elevation, some TWI in septal leads present in previous ECG.   Pt denied any chest pain/SOB.   Since there is no ST elevation and elevation of troponin is expected in the setting of NSTEMI, per previous with Dr. Eulah Pont,  she is not able to be fully anticoagulated @ this time. Will continue ASA/Plavix, start Heparin gtt and pursue cath once deemed safe by ortho from bleeding prospective.

## 2012-07-16 NOTE — Progress Notes (Signed)
Subjective: 1 Day Post-Op Procedure(s) (LRB): TOTAL KNEE ARTHROPLASTY (Right) Patient reports pain as 5 on 0-10 scale for r tka, denies any chest pain or SOB No nausea  Objective: Vital signs in last 24 hours: Temp:  [97.4 F (36.3 C)-98.6 F (37 C)] 97.8 F (36.6 C) (07/10 0756) Pulse Rate:  [73-96] 93 (07/10 0800) Resp:  [9-27] 18 (07/10 0800) BP: (110-147)/(48-107) 119/61 mmHg (07/10 0800) SpO2:  [91 %-100 %] 98 % (07/10 0800) Arterial Line BP: (92-176)/(45-92) 120/53 mmHg (07/10 0800) FiO2 (%):  [50 %] 50 % (07/09 1309) Weight:  [76.4 kg (168 lb 6.9 oz)] 76.4 kg (168 lb 6.9 oz) (07/10 0615)  Intake/Output from previous day: 07/09 0701 - 07/10 0700 In: 2200 [P.O.:150; I.V.:2050] Out: 2815 [Urine:2555; Drains:210; Blood:50] Intake/Output this shift: Total I/O In: 50 [P.O.:50] Out: 400 [Urine:400]   Recent Labs  07/15/12 1141 07/15/12 1425 07/16/12 0430  HGB 10.9* 10.8* 10.2*    Recent Labs  07/15/12 1425 07/16/12 0430  WBC 13.2* 10.6*  RBC 3.77* 3.50*  HCT 31.5* 29.9*  PLT 252 260    Recent Labs  07/15/12 1425 07/16/12 0430  NA 138 136  K 3.6 3.9  CL 105 100  CO2 22 25  BUN 18 12  CREATININE 0.51 0.54  GLUCOSE 120* 128*  CALCIUM 8.3* 8.4    Recent Labs  07/15/12 1425  INR 1.09    Neurovascular intact Dorsiflexion/Plantar flexion intact Compartment soft hemovac in place  Assessment/Plan: 1 Day Post-Op Procedure(s) (LRB): TOTAL KNEE ARTHROPLASTY (Right) Advance diet Will hold on starting PT, use of CPM, ambulation until cleared to do so by cardiology  Susan Welch B 07/16/2012, 9:01 AM

## 2012-07-16 NOTE — Progress Notes (Signed)
Received order. Will educate pt after cath. Pt not appropriate for ambulation with [place] due to TKR. Will need PT. Ethelda Chick CES, ACSM 8:55 AM 07/16/2012

## 2012-07-16 NOTE — Plan of Care (Signed)
Problem: Consults Goal: Diagnosis- Total Joint Replacement Outcome: Progressing Primary Total Knee     

## 2012-07-16 NOTE — Progress Notes (Signed)
Anesthesiology Follow-up:  Awake and alert, in good spirits,having some R. Knee discomfort, no chest pain, no SOB  VS:T-36.6 BP 111/67 HR 95 RR 20 O2 Sat 98% on 2L  Results for MAILIN, COGLIANESE (MRN [id number]) as of 07/16/2012 09:52  Ref. Range 07/16/2012 04:30  Troponin I Latest Range: <0.30 ng/mL 12.61 [facility])   Results for MORINE, KOHLMAN (MRN [id number]) as of 07/16/2012 09:52  Ref. Range 07/15/2012 22:17  Troponin I Latest Range: <0.30 ng/mL 13.06 ([facility])    ECG shows Q wave in lead 3 and anterior ST/T changes  Impression intra-op  Ventricular fibrillation due to non-St elevation MI. Pt hemodynamically stable, plan cath possibly tomorrow.  I greatly appreciate Dr. Vern Claude help.  Kipp Brood, MD

## 2012-07-16 NOTE — Op Note (Signed)
NAMEMarland Kitchen  AMENAH, TUCCI NO.:  [id number]  MEDICAL RECORD NO.:  [id number]  LOCATION:  2309                         FACILITY:  [facility]  PHYSICIAN:  Loreta Ave, M.D. DATE OF BIRTH:  1945-10-13  DATE OF PROCEDURE:  07/15/2012 DATE OF DISCHARGE:                              OPERATIVE REPORT   PREOPERATIVE DIAGNOSES:  Right knee end-stage degenerative arthritis, varus alignment without significant contracture.  Large loose body.  POSTOPERATIVE DIAGNOSES:  Right knee end-stage degenerative arthritis, varus alignment without significant contracture.  Large loose body.  PROCEDURE:  Modified minimally invasive right total knee replacement utilizing [employer] prosthesis.  A cemented posterior stabilized #4 femoral component.  Cemented #3 tibial component and 11-mm polyethylene insert.  Cemented resurfacing 29-mm x 7.5-mm patellar component.  SURGEON:  Loreta Ave, M.D.  ASSISTANT:  Laural Benes. Jannet Mantis., present throughout the entire case, necessary for timely completion of procedure.  ANESTHESIA:  General.  BLOOD LOSS:  Minimal.  SPECIMENS:  None.  CULTURES:  None.  TOURNIQUET TIME:  50 minutes.  COMPLICATIONS:  During the procedure about 20 minutes in, the patient has spontaneous ventricular fibrillation, recognized by Anesthesia.  She maintain oxygenation and blood pressure.  She was immediately assessed by Anesthesia and treated with cardiac shock.  This immediately brought her into a normal rhythm maintaining her oxygenation and blood pressure. At that point in time, it was unclear what caused this.  As she stabilized and we gave this a few minutes to be sure she was stabilized and I was well in the procedure, we elected to continue the procedure and then assess matters in PACU.  This is a combined decision between anesthesia and myself.  She did remain stable throughout the rest of procedure.  PROCEDURE:  The patient was brought  to the operating room and placed on the operating table in supine position.  After adequate anesthesia had been obtained, right knee was examined.  Full extension 120 flexion. Varus alignment correctable.  Tourniquet was applied.  Prepped and draped in usual sterile fashion.  Exsanguinated with elevation of Esmarch.  Tourniquet inflated to 350 mmHg.  Straight incision above the patella down to the tibial tubercle.  Skin and subcutaneous tissue divided.  Hemostasis with cautery.  Medial arthrotomy, vastus splitting. Knee exposed.  Large loose body removed.  At this point in time as I was about to make my distal femoral cut, the event described above happened. The drapes were taken down.  The wound was wrapped sterilely and protected throughout.  Once it was deemed that she was stabilized, drapes were pulled back up.  A new top drape applied.  Dressing was taken down.  I felt we had maintained a sterile field throughout. Proceeded with her surgery.  Intramedullary guide to distal femur, 9.5- mm resection, 5 degrees of valgus.  Utilizing appropriate instrumentation, the femur was cut, prepared for a posterior stabilized #4 component.  Proximal tibial resection with extramedullary guide, 3- degree posterior slope cut.  Size #3 component.  Nicely balanced in flexion and extension.  Debris cleared throughout.  Patella was exposed. Posterior 8 mm was removed.  Drilled, sized, fitted for 29-mm component. Copious  irrigation.  All recess were examined.  Trial was put in place. Trials were used to mark rotation and tibial component, which was hand reamed.  With the #3 tibia, #4 femur, 29 patella and 11 insert, I was very pleased with balancing in flexion and extension, full motion, good mechanical axis, good stability, good patellar tracking.  All trials had been removed.  Copious irrigation with pulse irrigating device.  Cement prepared, placed on all components, firmly seated.   Polyethylene attached to tibia and knee reduced.  Patella held with a clamp.  Once cement hardened, the knee was irrigated twice again because of the incident described above.  Hemovac was placed and brought through a separate stab wound.  Arthrotomy was closed with #1 Vicryl.  Skin and subcutaneous tissue were closed with Vicryl and staples.  I did not use Exparel because of my fear that the reaction described above and may have become for Marcaine from her block.  Knee immobilizer was applied. Anesthesia was reversed.  Brought to the recovery room.     Loreta Ave, M.D.     DFM/MEDQ  D:  07/16/2012  T:  07/16/2012  Job:  [id number]

## 2012-07-16 NOTE — Anesthesia Procedure Notes (Signed)
Anesthesia Regional Block:    Pre-Anesthetic Checklist: ,, timeout performed, Correct Patient, Correct Site, Correct Laterality, Correct Procedure, Correct Position, site marked, Risks and benefits discussed, Surgical consent,  At surgeon's request and post-op pain management  Laterality: Right and Upper  Prep: chloraprep       Needles:  Injection technique: Single-shot  Needle Type: Echogenic Stimulator Needle     Needle Length:cm 9 cm Needle Gauge: 22 and 22 G    Additional Needles:  Procedures: ultrasound guided (picture in chart)  Narrative:  Start time: 07/16/2012 9:50 AM End time: 07/16/2012 10:00 AM  Performed by: Personally   Additional Notes: 25 cc 0.5% marcaine with 1:200 Epi injected

## 2012-07-16 NOTE — Clinical Social Work Note (Signed)
Per CM pt would like to go to [facility] for rehab.  CSW completed FL2 and faxed out.

## 2012-07-16 NOTE — Addendum Note (Signed)
Addendum created 07/16/12 0454 by Fransisca Kaufmann, CRNA   Modules edited: Anesthesia Medication Administration

## 2012-07-16 NOTE — Addendum Note (Signed)
Addendum created 07/16/12 0981 by Kipp Brood, MD   Modules edited: Anesthesia Blocks and Procedures

## 2012-07-17 ENCOUNTER — Encounter (HOSPITAL_COMMUNITY): Payer: Self-pay | Admitting: Anesthesiology

## 2012-07-17 ENCOUNTER — Other Ambulatory Visit: Payer: Self-pay | Admitting: *Deleted

## 2012-07-17 ENCOUNTER — Encounter (HOSPITAL_COMMUNITY): Payer: Self-pay | Admitting: Nurse Practitioner

## 2012-07-17 ENCOUNTER — Encounter (HOSPITAL_COMMUNITY)
Admission: RE | Disposition: A | Discharge status: Rehabilitation Facility | Payer: Self-pay | Source: Ambulatory Visit | Attending: Orthopedic Surgery

## 2012-07-17 DIAGNOSIS — E7800 Pure hypercholesterolemia, unspecified: Secondary | ICD-10-CM

## 2012-07-17 DIAGNOSIS — I2510 Atherosclerotic heart disease of native coronary artery without angina pectoris: Secondary | ICD-10-CM

## 2012-07-17 DIAGNOSIS — I10 Essential (primary) hypertension: Secondary | ICD-10-CM

## 2012-07-17 HISTORY — PX: LEFT HEART CATHETERIZATION WITH CORONARY ANGIOGRAM: SHX5451

## 2012-07-17 LAB — CBC
HCT: 26.3 % — ABNORMAL LOW (ref 36.0–46.0)
MCH: 29.7 pg (ref 26.0–34.0)
MCHC: 34.2 g/dL (ref 30.0–36.0)
MCV: 86.8 fL (ref 78.0–100.0)
RDW: 13.6 % (ref 11.5–15.5)

## 2012-07-17 SURGERY — LEFT HEART CATHETERIZATION WITH CORONARY ANGIOGRAM
Anesthesia: LOCAL

## 2012-07-17 MED ORDER — SODIUM CHLORIDE 0.9 % IJ SOLN: 3.0000 mL | INTRAMUSCULAR | Status: DC | PRN

## 2012-07-17 MED ORDER — ROSUVASTATIN CALCIUM 5 MG PO TABS
5.0000 mg | ORAL_TABLET | Freq: Every day | ORAL | Status: DC
  Administered 2012-07-17 – 2012-07-23 (×6): 5 mg via ORAL
  Filled 2012-07-17 (×8): qty 1

## 2012-07-17 MED ORDER — ASPIRIN 81 MG PO CHEW: 324.0000 mg | CHEWABLE_TABLET | ORAL | Status: DC

## 2012-07-17 MED ORDER — LIDOCAINE HCL (PF) 1 % IJ SOLN
INTRAMUSCULAR | Status: AC
  Filled 2012-07-17: qty 30

## 2012-07-17 MED ORDER — SODIUM CHLORIDE 0.9 % IV SOLN: 250.0000 mL | INTRAVENOUS | Status: DC | PRN

## 2012-07-17 MED ORDER — VERAPAMIL HCL 2.5 MG/ML IV SOLN
INTRAVENOUS | Status: AC
  Filled 2012-07-17: qty 2

## 2012-07-17 MED ORDER — HEPARIN (PORCINE) IN NACL 2-0.9 UNIT/ML-% IJ SOLN
INTRAMUSCULAR | Status: AC
  Filled 2012-07-17: qty 1000

## 2012-07-17 MED ORDER — HEPARIN SODIUM (PORCINE) 1000 UNIT/ML IJ SOLN
INTRAMUSCULAR | Status: AC
  Filled 2012-07-17: qty 1

## 2012-07-17 MED ORDER — NON FORMULARY: 5.0000 mg | Freq: Every day | Status: DC

## 2012-07-17 MED ORDER — SODIUM CHLORIDE 0.9 % IJ SOLN: 3.0000 mL | Freq: Two times a day (BID) | INTRAMUSCULAR | Status: DC

## 2012-07-17 MED ORDER — NITROGLYCERIN 0.2 MG/ML ON CALL CATH LAB
INTRAVENOUS | Status: AC
  Filled 2012-07-17: qty 1

## 2012-07-17 MED ORDER — MIDAZOLAM HCL 2 MG/2ML IJ SOLN
INTRAMUSCULAR | Status: AC
  Filled 2012-07-17: qty 2

## 2012-07-17 MED ORDER — SODIUM CHLORIDE 0.9 % IV SOLN: INTRAVENOUS | Status: AC

## 2012-07-17 MED ORDER — FENTANYL CITRATE 0.05 MG/ML IJ SOLN
INTRAMUSCULAR | Status: AC
  Filled 2012-07-17: qty 2

## 2012-07-17 NOTE — Interval H&P Note (Signed)
History and Physical Interval Note:  07/17/2012 10:11 AM  Susan Welch  has presented today for surgery, with the diagnosis of Chest pain  The various methods of treatment have been discussed with the patient and family. After consideration of risks, benefits and other options for treatment, the patient has consented to  Procedure(s): LEFT HEART CATHETERIZATION WITH CORONARY ANGIOGRAM (N/A) as a surgical intervention .  The patient's history has been reviewed, patient examined, no change in status, stable for surgery.  I have reviewed the patient's chart and labs.  Questions were answered to the patient's satisfaction.     Rollene Rotunda

## 2012-07-17 NOTE — Progress Notes (Signed)
Call from cath lab to prep patient for cardiac cath. Done. Family updated by pt. And nursing. Will accompany pt. And continue to monitor closely. Susan Welch

## 2012-07-17 NOTE — Progress Notes (Signed)
Subjective: 2 Days Post-Op Procedure(s) (LRB): TOTAL KNEE ARTHROPLASTY (Right) Patient reports pain as 4 on 0-10 scale and well controlled with pain meds.    Objective: Vital signs in last 24 hours: Temp:  [97.5 F (36.4 C)-100.2 F (37.9 C)] 98.5 F (36.9 C) (07/11 0714) Pulse Rate:  [83-113] 97 (07/11 0600) Resp:  [13-22] 14 (07/11 0600) BP: (98-124)/(56-69) 110/65 mmHg (07/11 0600) SpO2:  [93 %-100 %] 97 % (07/11 0600) Arterial Line BP: (106-136)/(48-62) 124/58 mmHg (07/11 0200) Weight:  [75.1 kg (165 lb 9.1 oz)] 75.1 kg (165 lb 9.1 oz) (07/11 0500)  Intake/Output from previous day: 07/10 0701 - 07/11 0700 In: 870 [P.O.:870] Out: 2115 [Urine:2115] Intake/Output this shift:     Recent Labs  07/15/12 1141 07/15/12 1425 07/16/12 0430  HGB 10.9* 10.8* 10.2*    Recent Labs  07/15/12 1425 07/16/12 0430  WBC 13.2* 10.6*  RBC 3.77* 3.50*  HCT 31.5* 29.9*  PLT 252 260    Recent Labs  07/15/12 1425 07/16/12 0430  NA 138 136  K 3.6 3.9  CL 105 100  CO2 22 25  BUN 18 12  CREATININE 0.51 0.54  GLUCOSE 120* 128*  CALCIUM 8.3* 8.4    Recent Labs  07/15/12 1425  INR 1.09    Neurovascular intact Dorsiflexion/Plantar flexion intact hemovac drain in place, dressing intact no drainage  Assessment/Plan: 2 Days Post-Op Procedure(s) (LRB): TOTAL KNEE ARTHROPLASTY (Right) Up with therapy if cleared by cardiology after cath Hopefully at least may begin some gentle ROM with PT this PM   [name],[name] 07/17/2012, 7:45 AM

## 2012-07-17 NOTE — H&P (View-Only) (Signed)
Subjective:  Patient recovering from intra-operative cardiac arrest.  Troponins trending down.  EKG shows evolving inferior wall MI NPO awaiting cath.  Objective:  Vital Signs in the last 24 hours: Temp:  [97.5 F (36.4 C)-100.2 F (37.9 C)] 98.5 F (36.9 C) (07/11 0714) Pulse Rate:  [83-113] 97 (07/11 0600) Resp:  [13-22] 14 (07/11 0600) BP: (98-124)/(56-69) 110/65 mmHg (07/11 0600) SpO2:  [93 %-100 %] 97 % (07/11 0600) Arterial Line BP: (106-136)/(48-62) 124/58 mmHg (07/11 0200) Weight:  [165 lb 9.1 oz (75.1 kg)] 165 lb 9.1 oz (75.1 kg) (07/11 0500)  Intake/Output from previous day: 07/10 0701 - 07/11 0700 In: 870 [P.O.:870] Out: 2115 [Urine:2115] Intake/Output from this shift:    . aspirin EC  81 mg Oral Daily  . clopidogrel  75 mg Oral Q breakfast  . diazepam  5 mg Oral On Call  . enoxaparin (LOVENOX) injection  40 mg Subcutaneous Q24H  . lisinopril  20 mg Oral Daily   And  . hydrochlorothiazide  25 mg Oral Daily  . metoprolol tartrate  12.5 mg Oral BID  . sodium chloride  3 mL Intravenous Q12H  . sodium chloride  3 mL Intravenous Q12H      Physical Exam: The patient appears to be in no distress.  Head and neck exam reveals that the pupils are equal and reactive.  The extraocular movements are full.  There is no scleral icterus.  Mouth and pharynx are benign.  No lymphadenopathy.  No carotid bruits.  The jugular venous pressure is normal.  Thyroid is not enlarged or tender.  Chest is clear to percussion and auscultation.  No rales or rhonchi.  Expansion of the chest is symmetrical.  Heart reveals S3 gallop. No murmur.  The abdomen is soft and nontender.  Bowel sounds are normoactive.  There is no hepatosplenomegaly or mass.  There are no abdominal bruits.  Extremities right leg immobilized from recent right total knee replacement. Neurologic exam is normal strength and no lateralizing weakness.  No sensory deficits.  Integument reveals no rash  Lab  Results:  Recent Labs  07/15/12 1425 07/16/12 0430  WBC 13.2* 10.6*  HGB 10.8* 10.2*  PLT 252 260    Recent Labs  07/15/12 1425 07/16/12 0430  NA 138 136  K 3.6 3.9  CL 105 100  CO2 22 25  GLUCOSE 120* 128*  BUN 18 12  CREATININE 0.51 0.54    Recent Labs  07/15/12 2217 07/16/12 0430  TROPONINI 13.06* 12.61*   Hepatic Function Panel No results found for this basename: PROT, ALBUMIN, AST, ALT, ALKPHOS, BILITOT, BILIDIR, IBILI,  in the last 72 hours  Recent Labs  07/16/12 0430  CHOL 207*   No results found for this basename: PROTIME,  in the last 72 hours  Imaging: Dg Knee Right Port  07/16/2012   *RADIOLOGY REPORT*  Clinical Data: Postop right total knee arthroplasty.  PORTABLE RIGHT KNEE - 1-2 VIEW  Comparison: None.  Findings: Spot AP and lateral fluoroscopic images demonstrate right total knee arthroplasty.  Hardware appears well positioned.  There is no evidence of acute fracture or dislocation.  A surgical drain, skin staples and a small amount of soft tissue emphysema are noted.  IMPRESSION: No demonstrated complication following total knee arthroplasty.   Original Report Authenticated By: Carey Bullocks, M.D.    Cardiac Studies: Telemetry shows sinus tachycardia Assessment/Plan:  1. Recent intra-operative MI 2. Hypertension. 3. Hypercholesterolemia.  Plan: Cath today.    Add statin in low  dose.  Prior history of intolerance (myalgias) to higher dose.    Continue ASA plavix BB    LOS: 2 days    Cassell Clement 07/17/2012, 8:27 AM

## 2012-07-17 NOTE — Consult Note (Addendum)
[address]       [name] [address]             [phone]          CARDIOTHORACIC SURGERY CONSULTATION REPORT  PCP is Dow Adolph, MD Referring Provider is Rollene Rotunda, MD   Reason for consultation:  Severe 3-vessel CAD  HPI:  Patient is a 67 year old divorced white female from [place] with no previous history of coronary artery disease but risk factors notable for history of hypertension and hyperlipidemia. The patient has been suffering from severe degenerative arthritis afflicting both knees for which she underwent elective right total knee replacement 07/15/2012. Intraoperatively the patient developed sudden onset V. Fib arrest approximately 25 minutes after induction of general anesthesia.  Chest compressions were begun promptly and the patient was subsequently cardioverted to sinus rhythm with a single countershock of 120 J.  She was somewhat bradycardic initially for which she was treated with atropine, but during the remainder of the procedure she remained hemodynamically stable. Intraoperative transesophageal echocardiogram demonstrated akinesis of the distal inferior wall and interventricular septum.  The patient remained stable throughout the remainder of her total knee replacement. Postoperatively she ruled in for an acute myocardial infarction and serial EKGs demonstrates findings consistent with an inferior wall myocardial infarction. She has not had any recurrent chest pain or arrhythmias postoperatively. She underwent cardiac catheterization earlier today by Dr. Antoine Poche. She was found to have critical three-vessel coronary artery disease with chronic occlusion of the right coronary artery and high-grade ostial stenosis of the left anterior descending coronary artery. Cardiothoracic surgical consultation has been requested.  The patient reports that over the past year or so she has had frequent episodes of "panic attacks" which she describes  as episodes are always start with tachypalpitations and subsequently are associated with shortness of breath and occasional chest discomfort. These episodes typically occur with stress and usually or transient and relatively short lived. She otherwise has stable exertional shortness of breath and no other history of chest discomfort. She denies any PND, orthopnea, or dizzy spells, or syncope. She has been suffering with severe pain related to degenerative arthritis in both knees for many years. This has gotten particularly bad in the right knee recently, for which she came in for elective surgery. Her physical mobility has been limited because of this pain, although she still has been ambulatory and driving a car living functionally independent.  Postoperatively the patient has remained quite stable. She states that she has remained on bed rest. At present she is not having much discomfort in her knee and she is not having any chest pain or shortness of breath.  Past Medical History  Diagnosis Date  . Arthritis     a. bilat knees s/p L TKA 07/15/2012  . Anemia   . Hypertension   . Anxiety     Hx: of situational anxiety  . Headache(784.0)     Hx: of migraines  . Hyperlipidemia   . Panic attacks   . Ventricular fibrillation     a. 07/15/2012 in setting of R TKA    Past Surgical History  Procedure Laterality Date  . Abdominal hysterectomy    . Dilation and curettage of uterus    . Total knee arthroplasty Right 07/15/2012    Procedure: TOTAL KNEE ARTHROPLASTY;  Surgeon: Loreta Ave, MD;  Location: [facility] OR;  Service: Orthopedics;  Laterality: Right;  drapes pulled back to provide cpr, new drape applied, protocol followed  Family History  Problem Relation Age of Onset  . Thyroid disease Mother     has thyroid removed    History   Social History  . Marital Status: Widowed    Spouse Name: N/A    Number of Children: N/A  . Years of Education: N/A   Occupational History  . admin  assistant    Social History Main Topics  . Smoking status: Former Smoker    Quit date: 01/08/2000  . Smokeless tobacco: Not on file  . Alcohol Use: Yes     Comment: "occasional wine"  . Drug Use: No  . Sexually Active: Not Currently   Other Topics Concern  . Not on file   Social History Narrative   Divorced and lives alone.  Works full time.    Prior to Admission medications   Medication Sig Start Date End Date Taking? Authorizing Provider  acetaminophen (TYLENOL) 500 MG tablet Take 1,000 mg by mouth daily.   Yes Historical Provider, MD  calcium citrate-vitamin D (CITRACAL+D) 315-200 MG-UNIT per tablet Take 1 tablet by mouth daily.   Yes Historical Provider, MD  lisinopril-hydrochlorothiazide (PRINZIDE,ZESTORETIC) 20-25 MG per tablet Take 1 tablet by mouth daily. 09/20/11 09/19/12 Yes Jonita Albee, MD  OVER THE COUNTER MEDICATION Take 500 mg by mouth daily.   Yes Historical Provider, MD    Current Facility-Administered Medications  Medication Dose Route Frequency Provider Last Rate Last Dose  . 0.9 %  sodium chloride infusion  250 mL Intravenous PRN Ok Anis, NP      . 0.9 %  sodium chloride infusion   Intravenous Continuous Rollene Rotunda, MD 75 mL/hr at 07/17/12 1127    . acetaminophen (TYLENOL) tablet 650 mg  650 mg Oral Q4H PRN Ok Anis, NP      . aspirin EC tablet 81 mg  81 mg Oral Daily Ok Anis, NP   81 mg at 07/17/12 1359  . clopidogrel (PLAVIX) tablet 75 mg  75 mg Oral Q breakfast Ok Anis, NP   75 mg at 07/17/12 0858  . enoxaparin (LOVENOX) injection 40 mg  40 mg Subcutaneous Q24H Ok Anis, NP   40 mg at 07/16/12 1803  . lisinopril (PRINIVIL,ZESTRIL) tablet 20 mg  20 mg Oral Daily Loreta Ave, MD   20 mg at 07/17/12 1359   And  . hydrochlorothiazide (HYDRODIURIL) tablet 25 mg  25 mg Oral Daily Loreta Ave, MD   25 mg at 07/17/12 1359  . HYDROmorphone (DILAUDID) injection 0.5 mg  0.5 mg Intravenous Q4H PRN  Clarene Critchley, PA-C   0.5 mg at 07/17/12 7829  . metoprolol tartrate (LOPRESSOR) tablet 12.5 mg  12.5 mg Oral BID Ok Anis, NP   12.5 mg at 07/17/12 1400  . nitroGLYCERIN (NITROSTAT) SL tablet 0.4 mg  0.4 mg Sublingual Q5 Min x 3 PRN Ok Anis, NP      . ondansetron [facility]) injection 4 mg  4 mg Intravenous Q6H PRN Ok Anis, NP      . oxyCODONE-acetaminophen (PERCOCET/ROXICET) 5-325 MG per tablet 1-2 tablet  1-2 tablet Oral Q4H PRN Clarene Critchley, PA-C   2 tablet at 07/17/12 0858  . rosuvastatin (CRESTOR) tablet 5 mg  5 mg Oral q1800 Cassell Clement, MD      . sodium chloride 0.9 % injection 3 mL  3 mL Intravenous Q12H Ok Anis, NP   3 mL at 07/16/12 2129  . sodium chloride 0.9 %  injection 3 mL  3 mL Intravenous PRN Ok Anis, NP        Allergies  Allergen Reactions  . Statins Other (See Comments)    Joint pain  . Nickel Rash      Review of Systems:   General:  normal appetite, decreased energy, no weight gain, no weight loss, no fever  Cardiac:  + chest pain with exertion, + chest pain at rest, + SOB with exertion, + resting SOB, no PND, no orthopnea, + palpitations, + arrhythmia, no atrial fibrillation, no LE edema, no dizzy spells, no syncope  Respiratory:  + shortness of breath, no home oxygen, no productive cough, no dry cough, no bronchitis, no wheezing, no hemoptysis, no asthma, no pain with inspiration or cough, no sleep apnea, no CPAP at night  GI:   no difficulty swallowing, no reflux, no frequent heartburn, no hiatal hernia, no abdominal pain, no constipation, no diarrhea, no hematochezia, no hematemesis, no melena  GU:   no dysuria,  no frequency, no urinary tract infection, no hematuria, no kidney stones, no kidney disease  Vascular:  no pain suggestive of claudication, no pain in feet, no leg cramps, + varicose veins, no DVT, no non-healing foot ulcer  Neuro:   no stroke, no TIA's, no seizures, no headaches, no temporary  blindness one eye,  no slurred speech, no peripheral neuropathy, + chronic pain, no instability of gait, no memory/cognitive dysfunction  Musculoskeletal: + arthritis, + joint swelling, no myalgias, + difficulty walking, limited mobility   Skin:   no rash, no itching, no skin infections, no pressure sores or ulcerations  Psych:   + anxiety, no depression, + nervousness, + unusual recent stress  Eyes:   no blurry vision, no floaters, no recent vision changes, + wears glasses or contacts  ENT:   no hearing loss, no loose or painful teeth, no dentures  Hematologic:  no easy bruising, no abnormal bleeding, no clotting disorder, no frequent epistaxis  Endocrine:  no diabetes, does not check CBG's at home     Physical Exam:   BP 136/62  Pulse 101  Temp(Src) 98.5 F (36.9 C) (Oral)  Resp 15  Ht 5\' 2"  (1.575 m)  Wt 75.1 kg (165 lb 9.1 oz)  BMI 30.27 kg/m2  SpO2 100%  General:  Mildly obese,  well-appearing  HEENT:  Unremarkable   Neck:   no JVD, no bruits, no adenopathy   Chest:   clear to auscultation, symmetrical breath sounds, no wheezes, no rhonchi   CV:   RRR, no  murmur   Abdomen:  soft, non-tender, no masses   Extremities:  warm, well-perfused, pulses diminished, knee immobilizer on right leg  Rectal/GU  Deferred  Neuro:   Grossly non-focal and symmetrical throughout  Skin:   Clean and dry, no rashes, no breakdown  Diagnostic Tests:  Cardiac Catheterization Procedure Note  Name: Valkyrie Guardiola  MRN: [id number]  DOB: [birth year]  Procedure: Left Heart Cath, Selective Coronary Angiography, LV angiography  Indication: Vfib, NQWMI  Procedural details: The right radial was prepped, draped, and anesthetized with 1% lidocaine. Using modified Seldinger technique, a 5 French sheath was introduced into the right radial artery. Standard Judkins catheters were used for coronary angiography and left ventriculography. Catheter exchanges were performed over a guidewire. There were no immediate  procedural complications. The patient was transferred to the post catheterization recovery area for further monitoring.  Procedural Findings:  Hemodynamics:  AO 111/64  LV 111/14  Coronary angiography:  Coronary dominance:  Right  Left mainstem: Short  Left anterior descending (LAD): Proximal/ostial focal 90% stenosis. Mid 70% at the take of of the D1. D1 is moderate sized and branching with ostial 80% stenosis and mid 70% at the bifurcation.  Left circumflex (LCx): RI is very large with long 90% stenosis. AV groove is calcified with long proximal 30% stenosis, 50% stenosis at the take off of a large MOM. The MOM is otherwise free of high grade stenosis.  Right coronary artery (RCA): Proximal 100% chronic occlusion. The distal vessel fills via collaterals from the LAD and circ. The distal vessel is large and appears to be free of high grade disease.  Left ventriculography: Left ventricular systolic function is mildly reduced with inferior akinesis, LVEF is estimated at 45%, there is no significant mitral regurgitation  Final Conclusions: Severe 3 vessel CAD with mildly reduced EF.  Recommendations: Plan CABG.  Rollene Rotunda  07/17/2012, 11:01 AM     Impression:  Patient has severe three-vessel coronary artery disease with critical 90% ostial stenosis of the left anterior descending coronary artery, chronic occlusion of the right coronary artery with left-to-right collaterals, long segment 90% stenosis of a very large intermediate branch coronary artery. The patient sustained V. Fib arrest shortly after induction with general anesthesia for right total knee replacement performed 2 days ago. She was promptly cardioverted into sinus rhythm and was able to complete the procedure uneventfully.  She has remained stable postoperatively, although she has ruled in for an acute inferior wall myocardial infarction by enzymes and EKGs.  Based upon her coronary anatomy I agree that she needs coronary artery  bypass grafting. Under the circumstances I feel it would probably be best to proceed with surgery during this hospitalization.    Plan:  The patient's daughter-in-law has requested that Dr. Tyrone Sage be involved in the patient's care. He is currently out of town but will be back on Monday. We will tentatively plan for the patient undergo coronary artery bypass grafting on Tuesday, July 15. I've reviewed the indications, risks, and potential benefits of surgery with the patient this afternoon. Alternative treatment strategies been discussed in detail as well as rationale for timing of surgery.  The patient understands and accepts all potential associated risks of surgery including but not limited to risk of death, stroke, myocardial infarction, congestive heart failure, respiratory failure, renal failure, bleeding requiring blood transfusion and/or reexploration, arrhythmia, heart block or bradycardia requiring permanent pacemaker, pneumonia, pleural effusion, wound infection, pulmonary embolus or other thromboembolic complication, chronic pain or other delayed complications including the late recurrence of symptomatic CAD.  All questions answered.     Salvatore Decent. Cornelius Moras, MD 07/17/2012 4:37 PM  I spent in excess of 90 minutes of time directly involved in the conduct of this consultation.   Addendum - patient had been receiving Plavix.  Will check platelet function on Monday morning to make certain lingering effects not present prior to surgery.

## 2012-07-17 NOTE — CV Procedure (Signed)
  Cardiac Catheterization Procedure Note  Name: Susan Welch MRN: [id number] DOB: 1945/10/04  Procedure: Left Heart Cath, Selective Coronary Angiography, LV angiography  Indication:   Vfib, NQWMI  Procedural details: The right radial was prepped, draped, and anesthetized with 1% lidocaine. Using modified Seldinger technique, a 5 French sheath was introduced into the right radial artery. Standard Judkins catheters were used for coronary angiography and left ventriculography. Catheter exchanges were performed over a guidewire. There were no immediate procedural complications. The patient was transferred to the post catheterization recovery area for further monitoring.  Procedural Findings:   Hemodynamics:     AO 111/64    LV 111/14   Coronary angiography:   Coronary dominance: Right  Left mainstem:   Short  Left anterior descending (LAD):   Proximal/ostial focal 90% stenosis.  Mid 70% at the take of of the D1.  D1 is moderate sized and branching with ostial 80% stenosis and mid 70% at the bifurcation.    Left circumflex (LCx):  RI is very large with long 90% stenosis.  AV groove is calcified with long proximal 30% stenosis, 50% stenosis at the take off of a large MOM.  The MOM is otherwise free of high grade stenosis.    Right coronary artery (RCA):  Proximal 100% chronic occlusion.  The distal vessel fills via collaterals from the LAD and circ.  The distal vessel is large and appears to be free of high grade disease.   Left ventriculography: Left ventricular systolic function is mildly reduced with inferior akinesis, LVEF is estimated at 45%, there is no significant mitral regurgitation   Final Conclusions:  Severe 3 vessel CAD with mildly reduced EF.    Recommendations:  Plan CABG.    Rollene Rotunda 07/17/2012, 11:01 AM

## 2012-07-17 NOTE — Progress Notes (Signed)
 Subjective:  Patient recovering from intra-operative cardiac arrest.  Troponins trending down.  EKG shows evolving inferior wall MI NPO awaiting cath.  Objective:  Vital Signs in the last 24 hours: Temp:  [97.5 F (36.4 C)-100.2 F (37.9 C)] 98.5 F (36.9 C) (07/11 0714) Pulse Rate:  [83-113] 97 (07/11 0600) Resp:  [13-22] 14 (07/11 0600) BP: (98-124)/(56-69) 110/65 mmHg (07/11 0600) SpO2:  [93 %-100 %] 97 % (07/11 0600) Arterial Line BP: (106-136)/(48-62) 124/58 mmHg (07/11 0200) Weight:  [165 lb 9.1 oz (75.1 kg)] 165 lb 9.1 oz (75.1 kg) (07/11 0500)  Intake/Output from previous day: 07/10 0701 - 07/11 0700 In: 870 [P.O.:870] Out: 2115 [Urine:2115] Intake/Output from this shift:    . aspirin EC  81 mg Oral Daily  . clopidogrel  75 mg Oral Q breakfast  . diazepam  5 mg Oral On Call  . enoxaparin (LOVENOX) injection  40 mg Subcutaneous Q24H  . lisinopril  20 mg Oral Daily   And  . hydrochlorothiazide  25 mg Oral Daily  . metoprolol tartrate  12.5 mg Oral BID  . sodium chloride  3 mL Intravenous Q12H  . sodium chloride  3 mL Intravenous Q12H      Physical Exam: The patient appears to be in no distress.  Head and neck exam reveals that the pupils are equal and reactive.  The extraocular movements are full.  There is no scleral icterus.  Mouth and pharynx are benign.  No lymphadenopathy.  No carotid bruits.  The jugular venous pressure is normal.  Thyroid is not enlarged or tender.  Chest is clear to percussion and auscultation.  No rales or rhonchi.  Expansion of the chest is symmetrical.  Heart reveals S3 gallop. No murmur.  The abdomen is soft and nontender.  Bowel sounds are normoactive.  There is no hepatosplenomegaly or mass.  There are no abdominal bruits.  Extremities right leg immobilized from recent right total knee replacement. Neurologic exam is normal strength and no lateralizing weakness.  No sensory deficits.  Integument reveals no rash  Lab  Results:  Recent Labs  07/15/12 1425 07/16/12 0430  WBC 13.2* 10.6*  HGB 10.8* 10.2*  PLT 252 260    Recent Labs  07/15/12 1425 07/16/12 0430  NA 138 136  K 3.6 3.9  CL 105 100  CO2 22 25  GLUCOSE 120* 128*  BUN 18 12  CREATININE 0.51 0.54    Recent Labs  07/15/12 2217 07/16/12 0430  TROPONINI 13.06* 12.61*   Hepatic Function Panel No results found for this basename: PROT, ALBUMIN, AST, ALT, ALKPHOS, BILITOT, BILIDIR, IBILI,  in the last 72 hours  Recent Labs  07/16/12 0430  CHOL 207*   No results found for this basename: PROTIME,  in the last 72 hours  Imaging: Dg Knee Right Port  07/16/2012   *RADIOLOGY REPORT*  Clinical Data: Postop right total knee arthroplasty.  PORTABLE RIGHT KNEE - 1-2 VIEW  Comparison: None.  Findings: Spot AP and lateral fluoroscopic images demonstrate right total knee arthroplasty.  Hardware appears well positioned.  There is no evidence of acute fracture or dislocation.  A surgical drain, skin staples and a small amount of soft tissue emphysema are noted.  IMPRESSION: No demonstrated complication following total knee arthroplasty.   Original Report Authenticated By: William Veazey, M.D.    Cardiac Studies: Telemetry shows sinus tachycardia Assessment/Plan:  1. Recent intra-operative MI 2. Hypertension. 3. Hypercholesterolemia.  Plan: Cath today.    Add statin in low   dose.  Prior history of intolerance (myalgias) to higher dose.    Continue ASA plavix BB    LOS: 2 days    Leanette Eutsler 07/17/2012, 8:27 AM    

## 2012-07-17 NOTE — Progress Notes (Signed)
Unable to palpate Left Pedal Pulses d/t documented Surgical dressing. - Toes warm with normal cap. Refill. Able to move toes appropriately. Will continue to monitor closely.Susan Welch

## 2012-07-18 DIAGNOSIS — I2119 ST elevation (STEMI) myocardial infarction involving other coronary artery of inferior wall: Secondary | ICD-10-CM

## 2012-07-18 NOTE — Progress Notes (Signed)
   SUBJECTIVE:  No chest pain.  No SOB.     PHYSICAL EXAM Filed Vitals:   07/18/12 0535 07/18/12 0600 07/18/12 0700 07/18/12 0800  BP:  85/43  107/64  Pulse: 96 100 84 90  Temp:    98 F (36.7 C)  TempSrc:      Resp: 12 19 14 16   Height:      Weight:      SpO2: 100% 100% 100% 100%   General:  No distress HEENT:  PERRL Lungs:  Clear Heart:  RRR Abdomen:  Positive bowel sounds, no rebound no guarding Extremities:  Right wrist with old echymosis otherwise OK.  Right knee bandaged.   Neuro:  nonfocal  LABS: Lab Results  Component Value Date   TROPONINI 12.61* 07/16/2012   Results for orders placed during the hospital encounter of 07/15/12 (from the past 24 hour(s))  CBC     Status: Abnormal   Collection Time    07/17/12  2:15 PM      Result Value Range   WBC 10.6 (*) 4.0 - 10.5 K/uL   RBC 3.03 (*) 3.87 - 5.11 MIL/uL   Hemoglobin 9.0 (*) 12.0 - 15.0 g/dL   HCT 96.0 (*) 45.4 - 09.8 %   MCV 86.8  78.0 - 100.0 fL   MCH 29.7  26.0 - 34.0 pg   MCHC 34.2  30.0 - 36.0 g/dL   RDW 11.9  14.7 - 82.9 %   Platelets 200  150 - 400 K/uL  CREATININE, SERUM     Status: None   Collection Time    07/17/12  2:15 PM      Result Value Range   Creatinine, Ser 0.66  0.50 - 1.10 mg/dL   GFR calc non Af Amer >90  >90 mL/min   GFR calc Af Amer >90  >90 mL/min    Intake/Output Summary (Last 24 hours) at 07/18/12 0828 Last data filed at 07/18/12 0700  Gross per 24 hour  Intake   1375 ml  Output   1850 ml  Net   -475 ml     ASSESSMENT AND PLAN:  CAD:  NQWMI.  3 vessel disease.  CABG on Tuesday.   Plavix stopped.  Last dose yesterday AM.  Check echocardiogram.    VFIB:  No further arrhythmias.   TKA:  Per ortho  ANEMIA:  Follow HBG.    Susan Welch [facility] 07/18/2012 8:28 AM

## 2012-07-18 NOTE — Progress Notes (Signed)
Order received. Pt on bedrest. Discussed OHS encouraging mobility as able with knee complications. Gave OHS booklet and pt will watch video this pm. Instructed in IS use. Pt very positive and appreciative. Will f/u postop when pt is ambulating in hall. [phone] Ethelda Chick CES, ACSM 9:15 AM 07/18/2012

## 2012-07-18 NOTE — Progress Notes (Signed)
Subjective: 1 Day Post-Op Procedure(s) (LRB): LEFT HEART CATHETERIZATION WITH CORONARY ANGIOGRAM (N/A) Patient reports pain as 3 on 0-10 scale.    Objective: Vital signs in last 24 hours: Temp:  [98 F (36.7 C)-99.5 F (37.5 C)] 98 F (36.7 C) (07/12 0800) Pulse Rate:  [84-116] 108 (07/12 0910) Resp:  [10-21] 16 (07/12 0800) BP: (81-140)/(43-65) 106/60 mmHg (07/12 0911) SpO2:  [96 %-100 %] 100 % (07/12 0800) Arterial Line BP: (84-143)/(51-92) 116/63 mmHg (07/12 0800) Weight:  [72.2 kg (159 lb 2.8 oz)] 72.2 kg (159 lb 2.8 oz) (07/12 0430)  Intake/Output from previous day: 07/11 0701 - 07/12 0700 In: 1375 [P.O.:1020; I.V.:355] Out: 1975 [Urine:1975] Intake/Output this shift: Total I/O In: 13 [I.V.:13] Out: 0    Recent Labs  07/15/12 1141 07/15/12 1425 07/16/12 0430 07/17/12 1415  HGB 10.9* 10.8* 10.2* 9.0*    Recent Labs  07/16/12 0430 07/17/12 1415  WBC 10.6* 10.6*  RBC 3.50* 3.03*  HCT 29.9* 26.3*  PLT 260 200    Recent Labs  07/15/12 1425 07/16/12 0430 07/17/12 1415  NA 138 136  --   K 3.6 3.9  --   CL 105 100  --   CO2 22 25  --   BUN 18 12  --   CREATININE 0.51 0.54 0.66  GLUCOSE 120* 128*  --   CALCIUM 8.3* 8.4  --     Recent Labs  07/15/12 1425  INR 1.09    Sensation intact distally Intact pulses distally Dorsiflexion/Plantar flexion intact hemovac drain pulled without difficulty; no active drainage noted overnight  Assessment/Plan: 1 Day Post-Op Procedure(s) (LRB): LEFT HEART CATHETERIZATION WITH CORONARY ANGIOGRAM (N/A) Will begin range of motion with CPM-no  Ambulation or further PT yet as per Cardiology  Greyson Riccardi B 07/18/2012, 9:57 AM

## 2012-07-18 NOTE — Progress Notes (Signed)
Orthopedic Tech Progress Note Patient Details:  Susan Welch 1945/10/02 [id number]  CPM Right Knee CPM Right Knee: On Right Knee Flexion (Degrees): 35 Right Knee Extension (Degrees): 0   Cammer, Mickie Bail 07/18/2012, 4:32 PM

## 2012-07-19 ENCOUNTER — Inpatient Hospital Stay (HOSPITAL_COMMUNITY): Payer: Medicare Other

## 2012-07-19 DIAGNOSIS — I2510 Atherosclerotic heart disease of native coronary artery without angina pectoris: Secondary | ICD-10-CM

## 2012-07-19 DIAGNOSIS — I059 Rheumatic mitral valve disease, unspecified: Secondary | ICD-10-CM

## 2012-07-19 DIAGNOSIS — Z01810 Encounter for preprocedural cardiovascular examination: Secondary | ICD-10-CM

## 2012-07-19 LAB — BASIC METABOLIC PANEL
BUN: 13 mg/dL (ref 6–23)
Calcium: 8.7 mg/dL (ref 8.4–10.5)
Creatinine, Ser: 0.66 mg/dL (ref 0.50–1.10)
GFR calc Af Amer: >90 mL/min (ref >90)
GFR calc non Af Amer: >90 mL/min (ref >90)
Potassium: 3.8 mEq/L (ref 3.5–5.1)

## 2012-07-19 LAB — CBC
Hemoglobin: 9 g/dL — ABNORMAL LOW (ref 12.0–15.0)
MCH: 29.3 pg (ref 26.0–34.0)
MCV: 87 fL (ref 78.0–100.0)
RBC: 3.07 MIL/uL — ABNORMAL LOW (ref 3.87–5.11)

## 2012-07-19 MED ORDER — ~~LOC~~ CARDIAC SURGERY, PATIENT & FAMILY EDUCATION
Freq: Once | Status: AC
  Administered 2012-07-19: 14:00:00
  Filled 2012-07-19: qty 1

## 2012-07-19 MED ORDER — LISINOPRIL 20 MG PO TABS
20.0000 mg | ORAL_TABLET | Freq: Every day | ORAL | Status: DC
  Administered 2012-07-19 – 2012-07-20 (×2): 20 mg via ORAL
  Filled 2012-07-19 (×3): qty 1

## 2012-07-19 MED ORDER — HYDROCHLOROTHIAZIDE 12.5 MG PO CAPS
12.5000 mg | ORAL_CAPSULE | Freq: Every day | ORAL | Status: DC
  Administered 2012-07-19 – 2012-07-20 (×2): 12.5 mg via ORAL
  Filled 2012-07-19 (×4): qty 1

## 2012-07-19 NOTE — Progress Notes (Signed)
      [address]       [name] [address]             [phone]     CARDIOTHORACIC SURGERY PROGRESS NOTE  2 Days Post-Op  S/P Procedure(s) (LRB): LEFT HEART CATHETERIZATION WITH CORONARY ANGIOGRAM (N/A)  Subjective: Feels well.  Knee improving.  No chest pain, SOB  Objective: Vital signs in last 24 hours: Temp:  [97.2 F (36.2 C)-98.8 F (37.1 C)] 98.8 F (37.1 C) (07/13 0800) Pulse Rate:  [89-124] 89 (07/13 0500) Cardiac Rhythm:  [-] Normal sinus rhythm (07/13 0800) Resp:  [6-31] 17 (07/13 1000) BP: (87-131)/(52-83) 103/65 mmHg (07/13 1000) SpO2:  [87 %-100 %] 99 % (07/13 1000) Arterial Line BP: (92-107)/(57) 92/57 mmHg (07/12 1300) Weight:  [71.7 kg (158 lb 1.1 oz)] 71.7 kg (158 lb 1.1 oz) (07/13 0500)  Physical Exam:  Rhythm:   sinus  Breath sounds: clear  Heart sounds:  RRR  Incisions:  n/a  Abdomen:  soft  Extremities:  Immobilizer in place on right   Intake/Output from previous day: 07/12 0701 - 07/13 0700 In: 573 [P.O.:520; I.V.:53] Out: 2200 [Urine:2200] Intake/Output this shift: Total I/O In: 350 [P.O.:350] Out: 500 [Urine:500]  Lab Results:  Recent Labs  07/17/12 1415 07/19/12 0330  WBC 10.6* 9.6  HGB 9.0* 9.0*  HCT 26.3* 26.7*  PLT 200 248   BMET:  Recent Labs  07/17/12 1415 07/19/12 0330  NA  --  134*  K  --  3.8  CL  --  98  CO2  --  29  GLUCOSE  --  108*  BUN  --  13  CREATININE 0.66 0.66  CALCIUM  --  8.7    CBG (last 3)  No results found for this basename: GLUCAP,  in the last 72 hours PT/INR:  No results found for this basename: LABPROT, INR,  in the last 72 hours  CXR:  *RADIOLOGY REPORT*  Clinical Data: Congestive heart failure.  PORTABLE CHEST - 1 VIEW  Comparison: 07/07/2012  Findings: Mild cardiac enlargement with normal pulmonary  vascularity. No edema. Linear atelectasis in the left lung bases  is new since previous study. No developing airspace disease or  consolidation. No blunting of  costophrenic angles. No  pneumothorax.  IMPRESSION:  Shallow inspiration with developing linear atelectasis in the lung  bases. Mild cardiac enlargement.  Original Report Authenticated By: Burman Nieves, M.D.    Assessment/Plan: S/P Procedure(s) (LRB): LEFT HEART CATHETERIZATION WITH CORONARY ANGIOGRAM (N/A)  Overall stable Will check platelet function in am tomorrow Possibly for CABG on Tuesday per Dr Maebelle Munroe H 07/19/2012 11:42 AM

## 2012-07-19 NOTE — Progress Notes (Signed)
  Echocardiogram 2D Echocardiogram has been performed.  Pascal Stiggers FRANCES 07/19/2012, 12:28 PM

## 2012-07-19 NOTE — Progress Notes (Signed)
   SUBJECTIVE:  No chest pain.  No SOB.     PHYSICAL EXAM Filed Vitals:   07/19/12 0500 07/19/12 0600 07/19/12 0700 07/19/12 0800  BP: 131/70 103/59 96/61   Pulse: 89     Temp: 98.4 F (36.9 C)   98.8 F (37.1 C)  TempSrc: Oral   Oral  Resp: 23 12 13    Height:      Weight: 158 lb 1.1 oz (71.7 kg)     SpO2: 100% 100% 99%    General:  No distress Lungs:  Clear Heart:  RRR Abdomen:  Positive bowel sounds, no rebound no guarding Extremities:  No change from yesterday.    LABS: Lab Results  Component Value Date   TROPONINI 14.95* 07/19/2012   Results for orders placed during the hospital encounter of 07/15/12 (from the past 24 hour(s))  TROPONIN I     Status: Abnormal   Collection Time    07/19/12  3:30 AM      Result Value Range   Troponin I 14.95 (*) <0.30 ng/mL  CBC     Status: Abnormal   Collection Time    07/19/12  3:30 AM      Result Value Range   WBC 9.6  4.0 - 10.5 K/uL   RBC 3.07 (*) 3.87 - 5.11 MIL/uL   Hemoglobin 9.0 (*) 12.0 - 15.0 g/dL   HCT 57.8 (*) 46.9 - 62.9 %   MCV 87.0  78.0 - 100.0 fL   MCH 29.3  26.0 - 34.0 pg   MCHC 33.7  30.0 - 36.0 g/dL   RDW 52.8  41.3 - 24.4 %   Platelets 248  150 - 400 K/uL  BASIC METABOLIC PANEL     Status: Abnormal   Collection Time    07/19/12  3:30 AM      Result Value Range   Sodium 134 (*) 135 - 145 mEq/L   Potassium 3.8  3.5 - 5.1 mEq/L   Chloride 98  96 - 112 mEq/L   CO2 29  19 - 32 mEq/L   Glucose, Bld 108 (*) 70 - 99 mg/dL   BUN 13  6 - 23 mg/dL   Creatinine, Ser 0.10  0.50 - 1.10 mg/dL   Calcium 8.7  8.4 - 27.2 mg/dL   GFR calc non Af Amer >90  >90 mL/min   GFR calc Af Amer >90  >90 mL/min    Intake/Output Summary (Last 24 hours) at 07/19/12 0951 Last data filed at 07/19/12 0600  Gross per 24 hour  Intake    450 ml  Output   2200 ml  Net  -1750 ml     ASSESSMENT AND PLAN:  CAD:  NQWMI.  3 vessel disease.  CABG on Tuesday.   Plavix stopped.  Last dose Friday AM.  Ordered echocardiogram.   I will  reduce ACE and diuretic as the BP is low.   VFIB:  No further arrhythmias.   TKA:  Per ortho.  Started on range of motion.   ANEMIA:  Follow HBG. Stable    Rollene Rotunda 07/19/2012 9:51 AM

## 2012-07-19 NOTE — Progress Notes (Signed)
Subjective: 2 Days Post-Op Procedure(s) (LRB): LEFT HEART CATHETERIZATION WITH CORONARY ANGIOGRAM (N/A) Patient reports pain as 3 on 0-10 scale.   Tolerated cpm Objective: Vital signs in last 24 hours: Temp:  [97.2 F (36.2 C)-98.8 F (37.1 C)] 98.7 F (37.1 C) (07/13 1200) Pulse Rate:  [89-124] 89 (07/13 0500) Resp:  [6-31] 16 (07/13 1200) BP: (87-131)/(52-83) 114/68 mmHg (07/13 1200) SpO2:  [87 %-100 %] 100 % (07/13 1200) Arterial Line BP: (92)/(57) 92/57 mmHg (07/12 1300) Weight:  [71.7 kg (158 lb 1.1 oz)] 71.7 kg (158 lb 1.1 oz) (07/13 0500)  Intake/Output from previous day: 07/12 0701 - 07/13 0700 In: 573 [P.O.:520; I.V.:53] Out: 2200 [Urine:2200] Intake/Output this shift: Total I/O In: 350 [P.O.:350] Out: 500 [Urine:500]   Recent Labs  07/17/12 1415 07/19/12 0330  HGB 9.0* 9.0*    Recent Labs  07/17/12 1415 07/19/12 0330  WBC 10.6* 9.6  RBC 3.03* 3.07*  HCT 26.3* 26.7*  PLT 200 248    Recent Labs  07/17/12 1415 07/19/12 0330  NA  --  134*  K  --  3.8  CL  --  98  CO2  --  29  BUN  --  13  CREATININE 0.66 0.66  GLUCOSE  --  108*  CALCIUM  --  8.7   No results found for this basename: LABPT, INR,  in the last 72 hours  Neurovascular intact Dorsiflexion/Plantar flexion intact Dressing changed;wound clean and dry withintact staples No sign of significant bleed post heparin  Assessment/Plan: 2 Days Post-Op Procedure(s) (LRB): LEFT HEART CATHETERIZATION WITH CORONARY ANGIOGRAM (N/A) Awaiting surgery for cardiac Continue cpm Dressing changes with gauze pads under ted hose prn  Alam Guterrez B 07/19/2012, 12:40 PM

## 2012-07-19 NOTE — Progress Notes (Addendum)
VASCULAR LAB PRELIMINARY  PRELIMINARY  PRELIMINARY  PRELIMINARY  Pre-op Cardiac Surgery  Carotid Findings:  Right:  40-59% ICA stenosis.  Left:  No evidence of significant ICA stenosis.  Bilateral:  Vertebral artery flow is antegrade.  Upper Extremity Right Left  Brachial Pressures 103 triphasic 109 triphasic  Radial Waveforms triphasic triphasic  Ulnar Waveforms monophasic monophasic  Palmar Arch (Allen's Test) * **   Findings:  *Right:  Doppler waveforms decrease greater than 50% with ulnar and remain normal with radial compressions.  **Left:  Doppler waveforms obliterate with ulnar and remain normal with radial compressions.    Lower  Extremity Right Left  Dorsalis Pedis    Anterior Tibial    Posterior Tibial    Ankle/Brachial Indices      Findings:  Palpable pedal pulses x 4.   Jolayne Branson, RVT 07/19/2012, 8:53 AM

## 2012-07-19 NOTE — Progress Notes (Signed)
Anesthesiology Follow-up:  Awake and alert mild R. knee soreness on CPM machine, no chest pain, no SOB, no further arrhythmias. Stable vital signs.  Cath on 7/11 show chronic RCA occlusion, 90% osteal LAD, 90% ramus branch of L. Cx.  EF 45%  Patient stable following R. TKR  on 07/15/12 complicated by intra-op V. Fib and Non-STEMI. Scheduled for CABG 7/15 by Dr. Tyrone Sage.  Kipp Brood, MD

## 2012-07-20 ENCOUNTER — Inpatient Hospital Stay (HOSPITAL_COMMUNITY): Payer: Medicare Other

## 2012-07-20 DIAGNOSIS — I2510 Atherosclerotic heart disease of native coronary artery without angina pectoris: Secondary | ICD-10-CM

## 2012-07-20 LAB — CBC
MCH: 28.9 pg (ref 26.0–34.0)
MCHC: 33.3 g/dL (ref 30.0–36.0)
Platelets: 288 10*3/uL (ref 150–400)
RBC: 3.08 MIL/uL — ABNORMAL LOW (ref 3.87–5.11)
RDW: 13.3 % (ref 11.5–15.5)

## 2012-07-20 LAB — PREPARE RBC (CROSSMATCH)

## 2012-07-20 LAB — POCT I-STAT 3, ART BLOOD GAS (G3+)
Bicarbonate: 29.7 mEq/L — ABNORMAL HIGH (ref 20.0–24.0)
O2 Saturation: 99 %
TCO2: 31 mmol/L (ref 0–100)
pCO2 arterial: 39.3 mmHg (ref 35.0–45.0)
pO2, Arterial: 108 mmHg — ABNORMAL HIGH (ref 80.0–100.0)

## 2012-07-20 MED ORDER — NITROGLYCERIN IN D5W 200-5 MCG/ML-% IV SOLN
2.0000 ug/min | INTRAVENOUS | Status: AC
  Administered 2012-07-21: 5 ug/min via INTRAVENOUS
  Filled 2012-07-20: qty 250

## 2012-07-20 MED ORDER — POTASSIUM CHLORIDE 2 MEQ/ML IV SOLN
80.0000 meq | INTRAVENOUS | Status: DC
  Filled 2012-07-20: qty 40

## 2012-07-20 MED ORDER — ALBUTEROL SULFATE (5 MG/ML) 0.5% IN NEBU
2.5000 mg | INHALATION_SOLUTION | Freq: Once | RESPIRATORY_TRACT | Status: AC
  Administered 2012-07-20: 2.5 mg via RESPIRATORY_TRACT

## 2012-07-20 MED ORDER — BISACODYL 5 MG PO TBEC
5.0000 mg | DELAYED_RELEASE_TABLET | Freq: Once | ORAL | Status: AC
  Administered 2012-07-20: 5 mg via ORAL
  Filled 2012-07-20: qty 1

## 2012-07-20 MED ORDER — EPINEPHRINE HCL 1 MG/ML IJ SOLN
0.5000 ug/min | INTRAVENOUS | Status: DC
  Filled 2012-07-20: qty 4

## 2012-07-20 MED ORDER — DEXMEDETOMIDINE HCL IN NACL 400 MCG/100ML IV SOLN
0.1000 ug/kg/h | INTRAVENOUS | Status: AC
  Administered 2012-07-21: 0.3 ug/kg/h via INTRAVENOUS
  Filled 2012-07-20: qty 100

## 2012-07-20 MED ORDER — PLASMA-LYTE 148 IV SOLN
INTRAVENOUS | Status: AC
  Administered 2012-07-21: 09:00:00
  Filled 2012-07-20: qty 2.5

## 2012-07-20 MED ORDER — SODIUM CHLORIDE 0.9 % IV SOLN
INTRAVENOUS | Status: DC
  Filled 2012-07-20: qty 30

## 2012-07-20 MED ORDER — LORAZEPAM 0.5 MG PO TABS
0.5000 mg | ORAL_TABLET | ORAL | Status: DC | PRN
  Administered 2012-07-21: 0.5 mg via ORAL
  Filled 2012-07-20: qty 1

## 2012-07-20 MED ORDER — DEXTROSE 5 % IV SOLN
30.0000 ug/min | INTRAVENOUS | Status: AC
  Administered 2012-07-21: 30 ug/min via INTRAVENOUS
  Administered 2012-07-21: 10 ug/min via INTRAVENOUS
  Administered 2012-07-21: 20 ug/min via INTRAVENOUS
  Filled 2012-07-20: qty 2

## 2012-07-20 MED ORDER — SODIUM CHLORIDE 0.9 % IV SOLN
INTRAVENOUS | Status: AC
  Administered 2012-07-21: 69.8 mL/h via INTRAVENOUS
  Filled 2012-07-20: qty 40

## 2012-07-20 MED ORDER — CHLORHEXIDINE GLUCONATE 4 % EX LIQD
60.0000 mL | Freq: Once | CUTANEOUS | Status: AC
  Administered 2012-07-20: 4 via TOPICAL
  Filled 2012-07-20: qty 60

## 2012-07-20 MED ORDER — SODIUM CHLORIDE 0.9 % IV SOLN
INTRAVENOUS | Status: AC
  Administered 2012-07-21: 1.5 [IU]/h via INTRAVENOUS
  Filled 2012-07-20: qty 1

## 2012-07-20 MED ORDER — CHLORHEXIDINE GLUCONATE 4 % EX LIQD
60.0000 mL | Freq: Once | CUTANEOUS | Status: AC
Start: 2012-07-21 — End: 2012-07-21
  Administered 2012-07-21: 4 via TOPICAL
  Filled 2012-07-20: qty 60

## 2012-07-20 MED ORDER — METOPROLOL TARTRATE 12.5 MG HALF TABLET
12.5000 mg | ORAL_TABLET | Freq: Once | ORAL | Status: AC
  Administered 2012-07-21: 12.5 mg via ORAL
  Filled 2012-07-20: qty 1

## 2012-07-20 MED ORDER — VANCOMYCIN HCL 10 G IV SOLR
1250.0000 mg | INTRAVENOUS | Status: AC
  Administered 2012-07-21: 1250 mg via INTRAVENOUS
  Filled 2012-07-20: qty 1250

## 2012-07-20 MED ORDER — DOPAMINE-DEXTROSE 3.2-5 MG/ML-% IV SOLN
2.0000 ug/kg/min | INTRAVENOUS | Status: AC
  Administered 2012-07-21: 3 ug/kg/min via INTRAVENOUS
  Filled 2012-07-20: qty 250

## 2012-07-20 MED ORDER — DEXTROSE 5 % IV SOLN
1.5000 g | INTRAVENOUS | Status: AC
  Administered 2012-07-21: 1.5 g via INTRAVENOUS
  Administered 2012-07-21: .75 g via INTRAVENOUS
  Filled 2012-07-20: qty 1.5

## 2012-07-20 MED ORDER — MAGNESIUM SULFATE 50 % IJ SOLN
40.0000 meq | INTRAMUSCULAR | Status: DC
  Filled 2012-07-20: qty 10

## 2012-07-20 MED ORDER — DEXTROSE 5 % IV SOLN
750.0000 mg | INTRAVENOUS | Status: DC
  Filled 2012-07-20: qty 750

## 2012-07-20 NOTE — Clinical Social Work Psychosocial (Signed)
     Clinical Social Work Department BRIEF PSYCHOSOCIAL ASSESSMENT 07/20/2012  Patient:  Susan Welch, Susan Welch     Account Number:  [id number]     Admit date:  07/15/2012  Clinical Social Worker:  Hulan Fray  Date/Time:  07/20/2012 02:49 PM  Referred by:  Care Management  Date Referred:  07/20/2012 Referred for  SNF Placement   Other Referral:   Interview type:  Patient Other interview type:    PSYCHOSOCIAL DATA Living Status:  ALONE Admitted from facility:   Level of care:   Primary support name:  Susan Welch ([phone]) Primary support relationship to patient:  CHILD, ADULT Degree of support available:   supportive daughter-in-law    CURRENT CONCERNS Current Concerns  Post-Acute Placement   Other Concerns:    SOCIAL WORK ASSESSMENT / PLAN Clinical Social Worker was informed that patient was admitted from a facility. CSW introduced self and explained reason for visit. Patient reported that she was not previously at a SNF, but was planned to go to [facility], but medical events deterred that disposition. Per patient is had knee surgery and is planned for a CABG. Patient did express interest in CIR placement. Patient reported that her "heart therapy" might override her need for PT for her knee. Patient reported that she wants to wait until after her surgery before deciding on disposition of SNF vs CIR. Patient reported her daughter-in-law, Susan Welch is main contact and wants CSW to speak with her as well to discuss disposition after her surgery    CSW will await PT/OT recommendations. Patient's insurance requires prior approval.    FL2 has already been completed and placed in shadow chart for MD signature.   Assessment/plan status:  Psychosocial Support/Ongoing Assessment of Needs Other assessment/ plan:   Information/referral to community resources:   CSW will provide SNF packet    PATIENTS/FAMILYS RESPONSE TO PLAN OF CARE: Patient reported that she prefers  to wait until after her surgery before discussing SNF placement any further. Patient did express interest in CIR as well. Patient reported that she would like CSW to speak with her daughter-in-law once she receives her CABG and can work with PT/OT.

## 2012-07-20 NOTE — Progress Notes (Signed)
Complained of pain  On the right leg while cpm in used. CPM stopped. [facility]TH[facility] made aware.

## 2012-07-20 NOTE — Progress Notes (Signed)
Subjective: 3 Days Post-Op Procedure(s) (LRB): LEFT HEART CATHETERIZATION WITH CORONARY ANGIOGRAM (N/A) Patient reports pain as 5 on 0-10 scale and increased because of twisting in the cpm, improving now.    Objective: Vital signs in last 24 hours: Temp:  [97.9 F (36.6 C)-99.7 F (37.6 C)] 98.3 F (36.8 C) (07/14 1202) Pulse Rate:  [89-109] 89 (07/14 1202) Resp:  [12-24] 18 (07/14 1202) BP: (107-125)/(60-79) 115/61 mmHg (07/14 1202) SpO2:  [90 %-100 %] 100 % (07/14 1202) Weight:  [71.7 kg (158 lb 1.1 oz)] 71.7 kg (158 lb 1.1 oz) (07/14 0352)  Intake/Output from previous day: 07/13 0701 - 07/14 0700 In: 1070 [P.O.:1070] Out: 2200 [Urine:2200] Intake/Output this shift: Total I/O In: 300 [P.O.:300] Out: -    Recent Labs  07/17/12 1415 07/19/12 0330 07/20/12 0500  HGB 9.0* 9.0* 8.9*    Recent Labs  07/19/12 0330 07/20/12 0500  WBC 9.6 7.8  RBC 3.07* 3.08*  HCT 26.7* 26.7*  PLT 248 288    Recent Labs  07/17/12 1415 07/19/12 0330  NA  --  134*  K  --  3.8  CL  --  98  CO2  --  29  BUN  --  13  CREATININE 0.66 0.66  GLUCOSE  --  108*  CALCIUM  --  8.7   No results found for this basename: LABPT, INR,  in the last 72 hours  Neurovascular intact Dorsiflexion/Plantar flexion intact Compartment soft  Assessment/Plan: 3 Days Post-Op Procedure(s) (LRB): LEFT HEART CATHETERIZATION WITH CORONARY ANGIOGRAM (N/A) Continue cpm Consider inpatient rehab  Analie Katzman B 07/20/2012, 1:51 PM

## 2012-07-20 NOTE — Progress Notes (Signed)
Patient ID: Susan Welch, female   DOB: [birth year], 67 y.o.   MRN: [id number]      [address]       [place] [address]             [phone]                 3 Days Post-Op Procedure(s) (LRB): LEFT HEART CATHETERIZATION WITH CORONARY ANGIOGRAM (N/A)  LOS: 5 days   Subjective: No chest pain today, feels well  Objective: Vital signs in last 24 hours: Patient Vitals for the past 24 hrs:  BP Temp Temp src Pulse Resp SpO2 Weight  07/20/12 1400 108/47 mmHg - - 92 17 94 % -  07/20/12 1202 115/61 mmHg 98.3 F (36.8 C) Oral 89 18 100 % -  07/20/12 1000 125/67 mmHg - - - 23 100 % -  07/20/12 0800 - - - 99 - - -  07/20/12 0750 - 99.7 F (37.6 C) Oral 99 14 100 % -  07/20/12 0741 116/79 mmHg - - - - - -  07/20/12 0352 117/66 mmHg 98.7 F (37.1 C) Oral 98 24 100 % 158 lb 1.1 oz (71.7 kg)  07/19/12 2352 107/60 mmHg 98.5 F (36.9 C) Oral 99 12 98 % -  07/19/12 2013 - 98.1 F (36.7 C) Oral 109 15 90 % -  07/19/12 2000 123/63 mmHg - - - 19 100 % -  07/19/12 1800 117/63 mmHg - - - 13 100 % -  07/19/12 1600 - 97.9 F (36.6 C) Oral - - - -    Filed Weights   07/18/12 0430 07/19/12 0500 07/20/12 0352  Weight: 159 lb 2.8 oz (72.2 kg) 158 lb 1.1 oz (71.7 kg) 158 lb 1.1 oz (71.7 kg)    Hemodynamic parameters for last 24 hours:    Intake/Output from previous day: 07/13 0701 - 07/14 0700 In: 1070 [P.O.:1070] Out: 2200 [Urine:2200] Intake/Output this shift: Total I/O In: 300 [P.O.:300] Out: -   Scheduled Meds: . aspirin EC  81 mg Oral Daily  . enoxaparin (LOVENOX) injection  40 mg Subcutaneous Q24H  . hydrochlorothiazide  12.5 mg Oral Daily   And  . lisinopril  20 mg Oral Daily  . metoprolol tartrate  12.5 mg Oral BID  . rosuvastatin  5 mg Oral q1800  . sodium chloride  3 mL Intravenous Q12H   Continuous Infusions:  PRN Meds:.sodium chloride, acetaminophen, HYDROmorphone (DILAUDID) injection, nitroGLYCERIN, ondansetron (ZOFRAN) IV, oxyCODONE-acetaminophen,  sodium chloride  General appearance: alert, cooperative, appears older than stated age and no distress Neurologic: intact Heart: regular rate and rhythm, S1, S2 normal, no murmur, click, rub or gallop Lungs: clear to auscultation bilaterally and normal percussion bilaterally Abdomen: soft, non-tender; bowel sounds normal; no masses,  no organomegaly Extremities: dressing rt knee anterior   Lab Results: CBC: Recent Labs  07/19/12 0330 07/20/12 0500  WBC 9.6 7.8  HGB 9.0* 8.9*  HCT 26.7* 26.7*  PLT 248 288   BMET:  Recent Labs  07/19/12 0330  NA 134*  K 3.8  CL 98  CO2 29  GLUCOSE 108*  BUN 13  CREATININE 0.66  CALCIUM 8.7    PT/INR: No results found for this basename: LABPROT, INR,  in the last 72 hours   Radiology Dg Chest [facility] 1 View  07/19/2012   *RADIOLOGY REPORT*  Clinical Data: Congestive heart failure.  PORTABLE CHEST - 1 VIEW  Comparison: 07/07/2012  Findings: Mild cardiac enlargement with normal pulmonary vascularity.  No edema.  Linear atelectasis in the left lung bases is new since previous study.  No developing airspace disease or consolidation.  No blunting of costophrenic angles.  No pneumothorax.  IMPRESSION: Shallow inspiration with developing linear atelectasis in the lung bases.  Mild cardiac enlargement.   Original Report Authenticated By: Burman Nieves, M.D.   ECHO:Study Conclusions  - Left ventricle: The cavity size was normal. Wall thickness was normal. Systolic function was mildly to moderately reduced. The estimated ejection fraction was in the range of 40% to 45%. Akinesis of the basal-midanteroseptal myocardium. Moderate hypokinesis of the distalanteroseptal myocardium. There was fusion of early and atrial contributions to ventricular filling. - Mitral valve: Mild regurgitation. Transthoracic echocardiography. M-mode, complete 2D, spectral Doppler, and color Doppler. Height: Height: 157.5cm. Height: 62in. Weight: Weight: 71.7kg.  Weight: 157.7lb. Body mass index: BMI: 28.9kg/m^2. Body surface area: BSA: 1.56m^2. Blood pressure: 96/61. Patient status: Inpatient. Location: ICU/CCU    Assessment/Plan: S/P Procedure(s) (LRB): LEFT HEART CATHETERIZATION WITH CORONARY ANGIOGRAM (N/A) I have discussed the planned CABG with the patient again and reviewed the films with her.  The goals risks and alternatives of the planned surgical procedure CABG have been discussed with the patient in detail. The risks of the procedure including death, infection, stroke, myocardial infarction, bleeding, blood transfusion have all been discussed specifically.  I have quoted Susan Welch a 2 % of perioperative mortality and a complication rate as high as 25 %. The patient's questions have been answered.Susan Welch is willing  to proceed with the planned procedure in am.   Delight Ovens MD 07/20/2012 3:40 PM

## 2012-07-21 ENCOUNTER — Encounter (HOSPITAL_COMMUNITY): Payer: Self-pay | Admitting: Anesthesiology

## 2012-07-21 ENCOUNTER — Inpatient Hospital Stay (HOSPITAL_COMMUNITY): Payer: Medicare Other

## 2012-07-21 ENCOUNTER — Encounter (HOSPITAL_COMMUNITY)
Admission: RE | Disposition: A | Discharge status: Rehabilitation Facility | Payer: Self-pay | Source: Ambulatory Visit | Attending: Orthopedic Surgery

## 2012-07-21 ENCOUNTER — Inpatient Hospital Stay (HOSPITAL_COMMUNITY): Payer: Medicare Other | Admitting: Anesthesiology

## 2012-07-21 DIAGNOSIS — I2510 Atherosclerotic heart disease of native coronary artery without angina pectoris: Secondary | ICD-10-CM

## 2012-07-21 HISTORY — PX: INTRAOPERATIVE TRANSESOPHAGEAL ECHOCARDIOGRAM: SHX5062

## 2012-07-21 HISTORY — PX: CORONARY ARTERY BYPASS GRAFT: SHX141

## 2012-07-21 LAB — CBC
HCT: 27.7 % — ABNORMAL LOW (ref 36.0–46.0)
HCT: 25.6 % — ABNORMAL LOW (ref 36.0–46.0)
Hemoglobin: 9.3 g/dL — ABNORMAL LOW (ref 12.0–15.0)
Hemoglobin: 9 g/dL — ABNORMAL LOW (ref 12.0–15.0)
MCH: 29.1 pg (ref 26.0–34.0)
MCH: 30.4 pg (ref 26.0–34.0)
MCH: 30.1 pg (ref 26.0–34.0)
MCHC: 33.6 g/dL (ref 30.0–36.0)
MCHC: 35.2 g/dL (ref 30.0–36.0)
MCV: 86.6 fL (ref 78.0–100.0)
MCV: 85.7 fL (ref 78.0–100.0)
MCV: 85.6 fL (ref 78.0–100.0)
Platelets: 323 10*3/uL (ref 150–400)
Platelets: 155 10*3/uL (ref 150–400)
Platelets: 181 10*3/uL (ref 150–400)
RBC: 3.2 MIL/uL — ABNORMAL LOW (ref 3.87–5.11)
RBC: 3.29 MIL/uL — ABNORMAL LOW (ref 3.87–5.11)
RBC: 2.99 MIL/uL — ABNORMAL LOW (ref 3.87–5.11)
RDW: 13.1 % (ref 11.5–15.5)
RDW: 13.7 % (ref 11.5–15.5)
WBC: 7.8 10*3/uL (ref 4.0–10.5)
WBC: 10.9 10*3/uL — ABNORMAL HIGH (ref 4.0–10.5)

## 2012-07-21 LAB — POCT I-STAT 4, (NA,K, GLUC, HGB,HCT)
Glucose, Bld: 111 mg/dL — ABNORMAL HIGH (ref 70–99)
Glucose, Bld: 108 mg/dL — ABNORMAL HIGH (ref 70–99)
Glucose, Bld: 125 mg/dL — ABNORMAL HIGH (ref 70–99)
Glucose, Bld: 145 mg/dL — ABNORMAL HIGH (ref 70–99)
HCT: 25 % — ABNORMAL LOW (ref 36.0–46.0)
HCT: 23 % — ABNORMAL LOW (ref 36.0–46.0)
HCT: 27 % — ABNORMAL LOW (ref 36.0–46.0)
HCT: 26 % — ABNORMAL LOW (ref 36.0–46.0)
HCT: 36 % (ref 36.0–46.0)
Hemoglobin: 7.8 g/dL — ABNORMAL LOW (ref 12.0–15.0)
Hemoglobin: 8.8 g/dL — ABNORMAL LOW (ref 12.0–15.0)
Hemoglobin: 9.2 g/dL — ABNORMAL LOW (ref 12.0–15.0)
Hemoglobin: 12.2 g/dL (ref 12.0–15.0)
Potassium: 3.7 mEq/L (ref 3.5–5.1)
Sodium: 135 mEq/L (ref 135–145)
Sodium: 140 mEq/L (ref 135–145)

## 2012-07-21 LAB — POCT I-STAT, CHEM 8
BUN: 10 mg/dL (ref 6–23)
Calcium, Ion: 1.04 mmol/L — ABNORMAL LOW (ref 1.13–1.30)
TCO2: 25 mmol/L (ref 0–100)

## 2012-07-21 LAB — POCT I-STAT 3, ART BLOOD GAS (G3+)
Acid-Base Excess: 2 mmol/L (ref 0.0–2.0)
Acid-Base Excess: 1 mmol/L (ref 0.0–2.0)
Acid-base deficit: 2 mmol/L (ref 0.0–2.0)
Bicarbonate: 24.4 mEq/L — ABNORMAL HIGH (ref 20.0–24.0)
O2 Saturation: 100 %
Patient temperature: 36.8
TCO2: 28 mmol/L (ref 0–100)
TCO2: 26 mmol/L (ref 0–100)
TCO2: 27 mmol/L (ref 0–100)
pCO2 arterial: 41.1 mmHg (ref 35.0–45.0)
pCO2 arterial: 43.9 mmHg (ref 35.0–45.0)
pH, Arterial: 7.354 (ref 7.350–7.450)
pH, Arterial: 7.396 (ref 7.350–7.450)
pO2, Arterial: 381 mmHg — ABNORMAL HIGH (ref 80.0–100.0)

## 2012-07-21 LAB — BASIC METABOLIC PANEL
BUN: 14 mg/dL (ref 6–23)
CO2: 31 mEq/L (ref 19–32)
Calcium: 9.3 mg/dL (ref 8.4–10.5)
Chloride: 97 mEq/L (ref 96–112)
Creatinine, Ser: 0.64 mg/dL (ref 0.50–1.10)
GFR calc Af Amer: >90 mL/min (ref >90)
GFR calc non Af Amer: >90 mL/min (ref >90)
Glucose, Bld: 106 mg/dL — ABNORMAL HIGH (ref 70–99)
Potassium: 3.8 mEq/L (ref 3.5–5.1)
Sodium: 136 mEq/L (ref 135–145)

## 2012-07-21 LAB — POCT I-STAT GLUCOSE
Glucose, Bld: 98 mg/dL (ref 70–99)
Glucose, Bld: 108 mg/dL — ABNORMAL HIGH (ref 70–99)
Operator id: 282221
Operator id: 3293

## 2012-07-21 LAB — HEMOGLOBIN A1C
Hgb A1c MFr Bld: 5.8 % — ABNORMAL HIGH (ref <5.7)
Mean Plasma Glucose: 120 mg/dL — ABNORMAL HIGH (ref <117)

## 2012-07-21 LAB — HEMOGLOBIN AND HEMATOCRIT, BLOOD
HCT: 25.4 % — ABNORMAL LOW (ref 36.0–46.0)
Hemoglobin: 8.8 g/dL — ABNORMAL LOW (ref 12.0–15.0)

## 2012-07-21 LAB — CREATININE, SERUM
Creatinine, Ser: 0.51 mg/dL (ref 0.50–1.10)
GFR calc Af Amer: >90 mL/min (ref >90)
GFR calc non Af Amer: >90 mL/min (ref >90)

## 2012-07-21 LAB — PREPARE RBC (CROSSMATCH)

## 2012-07-21 LAB — PLATELET COUNT: Platelets: 180 10*3/uL (ref 150–400)

## 2012-07-21 LAB — GLUCOSE, CAPILLARY
Glucose-Capillary: 108 mg/dL — ABNORMAL HIGH (ref 70–99)
Glucose-Capillary: 116 mg/dL — ABNORMAL HIGH (ref 70–99)

## 2012-07-21 LAB — MAGNESIUM: Magnesium: 2.9 mg/dL — ABNORMAL HIGH (ref 1.5–2.5)

## 2012-07-21 SURGERY — CORONARY ARTERY BYPASS GRAFTING (CABG)
Anesthesia: General | Site: Chest | Wound class: Clean

## 2012-07-21 MED ORDER — LACTATED RINGERS IV SOLN
INTRAVENOUS | Status: DC | PRN
  Administered 2012-07-21 (×2): via INTRAVENOUS

## 2012-07-21 MED ORDER — NITROGLYCERIN IN D5W 200-5 MCG/ML-% IV SOLN: 0.0000 ug/min | INTRAVENOUS | Status: DC

## 2012-07-21 MED ORDER — METOCLOPRAMIDE HCL 5 MG/ML IJ SOLN
10.0000 mg | Freq: Four times a day (QID) | INTRAMUSCULAR | Status: AC
  Administered 2012-07-21 – 2012-07-22 (×4): 10 mg via INTRAVENOUS
  Filled 2012-07-21 (×4): qty 2

## 2012-07-21 MED ORDER — BISACODYL 10 MG RE SUPP: 10.0000 mg | Freq: Every day | RECTAL | Status: DC

## 2012-07-21 MED ORDER — OXYCODONE HCL 5 MG PO TABS
5.0000 mg | ORAL_TABLET | ORAL | Status: DC | PRN
Start: 2012-07-21 — End: 2012-07-23
  Administered 2012-07-21: 10 mg via ORAL
  Administered 2012-07-22: 5 mg via ORAL
  Administered 2012-07-22 (×3): 10 mg via ORAL
  Administered 2012-07-23: 5 mg via ORAL
  Filled 2012-07-21: qty 1
  Filled 2012-07-21 (×4): qty 2
  Filled 2012-07-21: qty 1

## 2012-07-21 MED ORDER — ROCURONIUM BROMIDE 100 MG/10ML IV SOLN
INTRAVENOUS | Status: DC | PRN
  Administered 2012-07-21 (×3): 50 mg via INTRAVENOUS

## 2012-07-21 MED ORDER — ALBUMIN HUMAN 5 % IV SOLN
250.0000 mL | INTRAVENOUS | Status: AC | PRN
  Administered 2012-07-21 (×3): 250 mL via INTRAVENOUS
  Filled 2012-07-21 (×2): qty 250

## 2012-07-21 MED ORDER — METOPROLOL TARTRATE 1 MG/ML IV SOLN: 2.5000 mg | INTRAVENOUS | Status: DC | PRN

## 2012-07-21 MED ORDER — PROPOFOL 10 MG/ML IV BOLUS
INTRAVENOUS | Status: DC | PRN
  Administered 2012-07-21: 80 mg via INTRAVENOUS

## 2012-07-21 MED ORDER — VECURONIUM BROMIDE 10 MG IV SOLR
INTRAVENOUS | Status: DC | PRN
  Administered 2012-07-21 (×2): 10 mg via INTRAVENOUS

## 2012-07-21 MED ORDER — METOPROLOL TARTRATE 12.5 MG HALF TABLET
12.5000 mg | ORAL_TABLET | Freq: Two times a day (BID) | ORAL | Status: DC
  Administered 2012-07-22 (×2): 12.5 mg via ORAL
  Filled 2012-07-21 (×5): qty 1

## 2012-07-21 MED ORDER — MIDAZOLAM HCL 5 MG/5ML IJ SOLN
INTRAMUSCULAR | Status: DC | PRN
  Administered 2012-07-21: 2 mg via INTRAVENOUS
  Administered 2012-07-21: 4 mg via INTRAVENOUS
  Administered 2012-07-21: 1 mg via INTRAVENOUS
  Administered 2012-07-21: 3 mg via INTRAVENOUS
  Administered 2012-07-21: 4 mg via INTRAVENOUS
  Administered 2012-07-21: 2 mg via INTRAVENOUS
  Administered 2012-07-21: 3 mg via INTRAVENOUS
  Administered 2012-07-21: 1 mg via INTRAVENOUS

## 2012-07-21 MED ORDER — MILRINONE IN DEXTROSE 20 MG/100ML IV SOLN
0.2500 ug/kg/min | INTRAVENOUS | Status: DC
  Filled 2012-07-21: qty 100

## 2012-07-21 MED ORDER — ACETAMINOPHEN 10 MG/ML IV SOLN
1000.0000 mg | Freq: Once | INTRAVENOUS | Status: AC
  Administered 2012-07-21: 1000 mg via INTRAVENOUS

## 2012-07-21 MED ORDER — POTASSIUM CHLORIDE 10 MEQ/50ML IV SOLN
10.0000 meq | INTRAVENOUS | Status: AC
  Administered 2012-07-21 (×3): 10 meq via INTRAVENOUS

## 2012-07-21 MED ORDER — LACTATED RINGERS IV SOLN: INTRAVENOUS | Status: DC

## 2012-07-21 MED ORDER — SODIUM CHLORIDE 0.9 % IJ SOLN
OROMUCOSAL | Status: DC | PRN
  Administered 2012-07-21 (×2): via TOPICAL

## 2012-07-21 MED ORDER — MILRINONE IN DEXTROSE 20 MG/100ML IV SOLN
0.3000 ug/kg/min | INTRAVENOUS | Status: DC
  Administered 2012-07-21 (×2): 0.3 ug/kg/min via INTRAVENOUS
  Filled 2012-07-21: qty 100

## 2012-07-21 MED ORDER — SODIUM CHLORIDE 0.9 % IV SOLN
250.0000 mL | INTRAVENOUS | Status: DC
  Administered 2012-07-22: 250 mL via INTRAVENOUS

## 2012-07-21 MED ORDER — DOCUSATE SODIUM 100 MG PO CAPS
200.0000 mg | ORAL_CAPSULE | Freq: Every day | ORAL | Status: DC
  Administered 2012-07-22 – 2012-07-23 (×2): 200 mg via ORAL
  Filled 2012-07-21 (×2): qty 2

## 2012-07-21 MED ORDER — ASPIRIN EC 325 MG PO TBEC
325.0000 mg | DELAYED_RELEASE_TABLET | Freq: Every day | ORAL | Status: DC
  Administered 2012-07-22: 325 mg via ORAL
  Filled 2012-07-21 (×2): qty 1

## 2012-07-21 MED ORDER — METOPROLOL TARTRATE 25 MG/10 ML ORAL SUSPENSION
12.5000 mg | Freq: Two times a day (BID) | ORAL | Status: DC
  Filled 2012-07-21 (×5): qty 5

## 2012-07-21 MED ORDER — 0.9 % SODIUM CHLORIDE (POUR BTL) OPTIME
TOPICAL | Status: DC | PRN
  Administered 2012-07-21: 1000 mL

## 2012-07-21 MED ORDER — CEFUROXIME SODIUM 1.5 G IJ SOLR
1.5000 g | Freq: Two times a day (BID) | INTRAMUSCULAR | Status: AC
  Administered 2012-07-21 – 2012-07-23 (×4): 1.5 g via INTRAVENOUS
  Filled 2012-07-21 (×4): qty 1.5

## 2012-07-21 MED ORDER — MORPHINE SULFATE 2 MG/ML IJ SOLN
1.0000 mg | INTRAMUSCULAR | Status: AC | PRN
  Administered 2012-07-21 (×3): 4 mg via INTRAVENOUS

## 2012-07-21 MED ORDER — ONDANSETRON HCL 4 MG/2ML IJ SOLN: 4.0000 mg | Freq: Four times a day (QID) | INTRAMUSCULAR | Status: DC | PRN

## 2012-07-21 MED ORDER — INSULIN REGULAR BOLUS VIA INFUSION
0.0000 [IU] | Freq: Three times a day (TID) | INTRAVENOUS | Status: DC
  Filled 2012-07-21: qty 10

## 2012-07-21 MED ORDER — ALBUTEROL SULFATE HFA 108 (90 BASE) MCG/ACT IN AERS
INHALATION_SPRAY | RESPIRATORY_TRACT | Status: DC | PRN
  Administered 2012-07-21: 4 via RESPIRATORY_TRACT

## 2012-07-21 MED ORDER — LACTATED RINGERS IV SOLN
INTRAVENOUS | Status: DC | PRN
  Administered 2012-07-21: 07:00:00 via INTRAVENOUS

## 2012-07-21 MED ORDER — VANCOMYCIN HCL IN DEXTROSE 1-5 GM/200ML-% IV SOLN
1000.0000 mg | Freq: Once | INTRAVENOUS | Status: AC
  Administered 2012-07-21: 1000 mg via INTRAVENOUS
  Filled 2012-07-21: qty 200

## 2012-07-21 MED ORDER — PHENYLEPHRINE HCL 10 MG/ML IJ SOLN
0.0000 ug/min | INTRAVENOUS | Status: DC
  Administered 2012-07-21: 40 ug/min via INTRAVENOUS
  Administered 2012-07-21: 53.333 ug/min via INTRAVENOUS
  Filled 2012-07-21 (×2): qty 2

## 2012-07-21 MED ORDER — ARTIFICIAL TEARS OP OINT
TOPICAL_OINTMENT | OPHTHALMIC | Status: DC | PRN
  Administered 2012-07-21: 1 via OPHTHALMIC

## 2012-07-21 MED ORDER — HEPARIN SODIUM (PORCINE) 1000 UNIT/ML IJ SOLN
INTRAMUSCULAR | Status: DC | PRN
  Administered 2012-07-21: 30000 [IU] via INTRAVENOUS

## 2012-07-21 MED ORDER — ASPIRIN 81 MG PO CHEW: 324.0000 mg | CHEWABLE_TABLET | Freq: Every day | ORAL | Status: DC

## 2012-07-21 MED ORDER — HEMOSTATIC AGENTS (NO CHARGE) OPTIME
TOPICAL | Status: DC | PRN
  Administered 2012-07-21: 1 via TOPICAL

## 2012-07-21 MED ORDER — FENTANYL CITRATE 0.05 MG/ML IJ SOLN
INTRAMUSCULAR | Status: DC | PRN
  Administered 2012-07-21 (×2): 50 ug via INTRAVENOUS
  Administered 2012-07-21: 150 ug via INTRAVENOUS
  Administered 2012-07-21 (×2): 100 ug via INTRAVENOUS
  Administered 2012-07-21: 350 ug via INTRAVENOUS
  Administered 2012-07-21: 50 ug via INTRAVENOUS
  Administered 2012-07-21: 250 ug via INTRAVENOUS
  Administered 2012-07-21 (×2): 150 ug via INTRAVENOUS
  Administered 2012-07-21: 100 ug via INTRAVENOUS

## 2012-07-21 MED ORDER — ACETAMINOPHEN 160 MG/5ML PO SOLN: 975.0000 mg | Freq: Four times a day (QID) | ORAL | Status: DC

## 2012-07-21 MED ORDER — PROTAMINE SULFATE 10 MG/ML IV SOLN
INTRAVENOUS | Status: DC | PRN
  Administered 2012-07-21 (×2): 60 mg via INTRAVENOUS
  Administered 2012-07-21 (×3): 50 mg via INTRAVENOUS
  Administered 2012-07-21: 30 mg via INTRAVENOUS

## 2012-07-21 MED ORDER — DEXMEDETOMIDINE HCL IN NACL 200 MCG/50ML IV SOLN
0.1000 ug/kg/h | INTRAVENOUS | Status: DC
  Administered 2012-07-21: 0.1 ug/kg/h via INTRAVENOUS
  Filled 2012-07-21: qty 50

## 2012-07-21 MED ORDER — ACETAMINOPHEN 500 MG PO TABS
1000.0000 mg | ORAL_TABLET | Freq: Four times a day (QID) | ORAL | Status: DC
Start: 2012-07-22 — End: 2012-07-23
  Administered 2012-07-22 – 2012-07-23 (×6): 1000 mg via ORAL
  Filled 2012-07-21 (×11): qty 2

## 2012-07-21 MED ORDER — SODIUM CHLORIDE 0.9 % IJ SOLN: 3.0000 mL | INTRAMUSCULAR | Status: DC | PRN

## 2012-07-21 MED ORDER — FAMOTIDINE IN NACL 20-0.9 MG/50ML-% IV SOLN
20.0000 mg | Freq: Two times a day (BID) | INTRAVENOUS | Status: AC
  Administered 2012-07-21: 20 mg via INTRAVENOUS
  Filled 2012-07-21: qty 50

## 2012-07-21 MED ORDER — SODIUM CHLORIDE 0.9 % IV SOLN
5.0000 g/h | Freq: Once | INTRAVENOUS | Status: DC
  Filled 2012-07-21: qty 20

## 2012-07-21 MED ORDER — LACTATED RINGERS IV SOLN: 500.0000 mL | Freq: Once | INTRAVENOUS | Status: AC | PRN

## 2012-07-21 MED ORDER — SODIUM CHLORIDE 0.9 % IV SOLN: INTRAVENOUS | Status: DC

## 2012-07-21 MED ORDER — MAGNESIUM SULFATE 40 MG/ML IJ SOLN: 4.0000 g | Freq: Once | INTRAMUSCULAR | Status: DC

## 2012-07-21 MED ORDER — BISACODYL 5 MG PO TBEC
10.0000 mg | DELAYED_RELEASE_TABLET | Freq: Every day | ORAL | Status: DC
  Administered 2012-07-22: 10 mg via ORAL
  Filled 2012-07-21: qty 2

## 2012-07-21 MED ORDER — MILRINONE IN DEXTROSE 20 MG/100ML IV SOLN
INTRAVENOUS | Status: DC | PRN
  Administered 2012-07-21: .3 ug/kg/min via INTRAVENOUS

## 2012-07-21 MED ORDER — ALBUMIN HUMAN 5 % IV SOLN
INTRAVENOUS | Status: DC | PRN
  Administered 2012-07-21 (×2): via INTRAVENOUS

## 2012-07-21 MED ORDER — PANTOPRAZOLE SODIUM 40 MG PO TBEC
40.0000 mg | DELAYED_RELEASE_TABLET | Freq: Every day | ORAL | Status: DC
  Administered 2012-07-23: 40 mg via ORAL
  Filled 2012-07-21: qty 1

## 2012-07-21 MED ORDER — MORPHINE SULFATE 2 MG/ML IJ SOLN
2.0000 mg | INTRAMUSCULAR | Status: DC | PRN
  Administered 2012-07-22 (×2): 2 mg via INTRAVENOUS
  Administered 2012-07-22 – 2012-07-23 (×2): 4 mg via INTRAVENOUS
  Filled 2012-07-21: qty 1
  Filled 2012-07-21 (×5): qty 2
  Filled 2012-07-21: qty 1

## 2012-07-21 MED ORDER — MIDAZOLAM HCL 2 MG/2ML IJ SOLN: 2.0000 mg | INTRAMUSCULAR | Status: DC | PRN

## 2012-07-21 MED ORDER — SODIUM CHLORIDE 0.9 % IJ SOLN
3.0000 mL | Freq: Two times a day (BID) | INTRAMUSCULAR | Status: DC
  Administered 2012-07-22: 10 mL via INTRAVENOUS
  Administered 2012-07-22 – 2012-07-23 (×2): 3 mL via INTRAVENOUS

## 2012-07-21 MED ORDER — MAGNESIUM SULFATE 40 MG/ML IJ SOLN
INTRAMUSCULAR | Status: AC
  Filled 2012-07-21: qty 100

## 2012-07-21 MED ORDER — SODIUM CHLORIDE 0.45 % IV SOLN: INTRAVENOUS | Status: DC

## 2012-07-21 MED ORDER — INSULIN REGULAR HUMAN 100 UNIT/ML IJ SOLN
INTRAMUSCULAR | Status: DC
  Filled 2012-07-21: qty 1

## 2012-07-21 SURGICAL SUPPLY — 117 items
ATTRACTOMAT 16X20 MAGNETIC DRP (DRAPES) ×3 IMPLANT
BAG DECANTER FOR FLEXI CONT (MISCELLANEOUS) ×3 IMPLANT
BANDAGE ELASTIC 4 VELCRO ST LF (GAUZE/BANDAGES/DRESSINGS) ×3 IMPLANT
BANDAGE ELASTIC 6 VELCRO ST LF (GAUZE/BANDAGES/DRESSINGS) ×3 IMPLANT
BANDAGE GAUZE ELAST BULKY 4 IN (GAUZE/BANDAGES/DRESSINGS) ×3 IMPLANT
BENZOIN TINCTURE PRP APPL 2/3 (GAUZE/BANDAGES/DRESSINGS) ×3 IMPLANT
BLADE STERNUM SYSTEM 6 (BLADE) ×3 IMPLANT
BLADE SURG 11 STRL SS (BLADE) ×3 IMPLANT
BLADE SURG ROTATE 9660 (MISCELLANEOUS) IMPLANT
CANISTER SUCTION 2500CC (MISCELLANEOUS) ×3 IMPLANT
CANN PRFSN .5XCNCT 15X34-48 (MISCELLANEOUS) ×2
CANNULA AORTIC HI-FLOW 6.5M20F (CANNULA) ×3 IMPLANT
CANNULA PRFSN .5XCNCT 15X34-48 (MISCELLANEOUS) ×2 IMPLANT
CANNULA VEN 2 STAGE (MISCELLANEOUS) ×4 IMPLANT
CANNULA VESSEL W/WING WO/VALVE (CANNULA) ×3 IMPLANT
CATH CPB KIT GERHARDT (MISCELLANEOUS) ×3 IMPLANT
CATH THORACIC 28FR (CATHETERS) ×3 IMPLANT
CATH THORACIC 36FR (CATHETERS) IMPLANT
CATH THORACIC 36FR RT ANG (CATHETERS) IMPLANT
CLIP RETRACTION 3.0MM CORONARY (MISCELLANEOUS) ×3 IMPLANT
CLIP TI MEDIUM 24 (CLIP) IMPLANT
CLIP TI WIDE RED SMALL 24 (CLIP) IMPLANT
CLOTH BEACON ORANGE TIMEOUT ST (SAFETY) ×3 IMPLANT
COVER SURGICAL LIGHT HANDLE (MISCELLANEOUS) ×3 IMPLANT
CRADLE DONUT ADULT HEAD (MISCELLANEOUS) ×3 IMPLANT
DRAIN CHANNEL 28F RND 3/8 FF (WOUND CARE) ×3 IMPLANT
DRAIN JACKSON PRATT 10MM FLAT (MISCELLANEOUS) ×3 IMPLANT
DRAPE CARDIOVASCULAR INCISE (DRAPES) ×1
DRAPE SLUSH/WARMER DISC (DRAPES) ×3 IMPLANT
DRAPE SRG 135X102X78XABS (DRAPES) ×2 IMPLANT
DRSG COVADERM 4X14 (GAUZE/BANDAGES/DRESSINGS) ×3 IMPLANT
ELECT BLADE 4.0 EZ CLEAN MEGAD (MISCELLANEOUS) ×3
ELECT REM PT RETURN 9FT ADLT (ELECTROSURGICAL) ×6
ELECTRODE BLDE 4.0 EZ CLN MEGD (MISCELLANEOUS) ×2 IMPLANT
ELECTRODE REM PT RTRN 9FT ADLT (ELECTROSURGICAL) ×4 IMPLANT
EVACUATOR SILICONE 100CC (DRAIN) ×3 IMPLANT
GLOVE BIO SURGEON STRL SZ 6 (GLOVE) ×6 IMPLANT
GLOVE BIO SURGEON STRL SZ 6.5 (GLOVE) ×30 IMPLANT
GLOVE BIO SURGEON STRL SZ7 (GLOVE) IMPLANT
GLOVE BIO SURGEON STRL SZ7.5 (GLOVE) IMPLANT
GLOVE BIOGEL PI IND STRL 6 (GLOVE) IMPLANT
GLOVE BIOGEL PI IND STRL 6.5 (GLOVE) IMPLANT
GLOVE BIOGEL PI IND STRL 7.0 (GLOVE) ×8 IMPLANT
GLOVE BIOGEL PI INDICATOR 6 (GLOVE)
GLOVE BIOGEL PI INDICATOR 6.5 (GLOVE)
GLOVE BIOGEL PI INDICATOR 7.0 (GLOVE) ×4
GOWN STRL NON-REIN LRG LVL3 (GOWN DISPOSABLE) ×30 IMPLANT
HEMOSTAT POWDER SURGIFOAM 1G (HEMOSTASIS) ×9 IMPLANT
HEMOSTAT SURGICEL 2X14 (HEMOSTASIS) ×3 IMPLANT
INSERT FOGARTY 61MM (MISCELLANEOUS) IMPLANT
INSERT FOGARTY XLG (MISCELLANEOUS) IMPLANT
KIT BASIN OR (CUSTOM PROCEDURE TRAY) ×3 IMPLANT
KIT ROOM TURNOVER OR (KITS) ×3 IMPLANT
KIT SUCTION CATH 14FR (SUCTIONS) ×6 IMPLANT
KIT VASOVIEW W/TROCAR VH 2000 (KITS) ×3 IMPLANT
LEAD PACING MYOCARDI (MISCELLANEOUS) ×3 IMPLANT
MARKER GRAFT CORONARY BYPASS (MISCELLANEOUS) ×9 IMPLANT
NS IRRIG 1000ML POUR BTL (IV SOLUTION) ×21 IMPLANT
PACK OPEN HEART (CUSTOM PROCEDURE TRAY) ×3 IMPLANT
PAD ARMBOARD 7.5X6 YLW CONV (MISCELLANEOUS) ×6 IMPLANT
PAD ELECT DEFIB RADIOL ZOLL (MISCELLANEOUS) ×3 IMPLANT
PENCIL BUTTON HOLSTER BLD 10FT (ELECTRODE) ×3 IMPLANT
PUNCH AORTIC ROTATE 4.0MM (MISCELLANEOUS) ×3 IMPLANT
PUNCH AORTIC ROTATE 4.5MM 8IN (MISCELLANEOUS) IMPLANT
PUNCH AORTIC ROTATE 5MM 8IN (MISCELLANEOUS) IMPLANT
SET CARDIOPLEGIA MPS 5001102 (MISCELLANEOUS) ×3 IMPLANT
SOLUTION ANTI FOG 6CC (MISCELLANEOUS) IMPLANT
SPONGE GAUZE 4X4 12PLY (GAUZE/BANDAGES/DRESSINGS) ×3 IMPLANT
SPONGE LAP 18X18 X RAY DECT (DISPOSABLE) ×9 IMPLANT
SPONGE LAP 4X18 X RAY DECT (DISPOSABLE) IMPLANT
STRIP CLOSURE SKIN 1/2X4 (GAUZE/BANDAGES/DRESSINGS) ×3 IMPLANT
SUT BONE WAX W31G (SUTURE) ×3 IMPLANT
SUT ETHILON 3 0 FSL (SUTURE) ×6 IMPLANT
SUT MNCRL AB 3-0 PS2 18 (SUTURE) ×3 IMPLANT
SUT MNCRL AB 4-0 PS2 18 (SUTURE) ×3 IMPLANT
SUT PROLENE 3 0 SH DA (SUTURE) IMPLANT
SUT PROLENE 3 0 SH1 36 (SUTURE) ×6 IMPLANT
SUT PROLENE 4 0 RB 1 (SUTURE)
SUT PROLENE 4 0 SH DA (SUTURE) IMPLANT
SUT PROLENE 4 0 TF (SUTURE) ×6 IMPLANT
SUT PROLENE 4-0 RB1 .5 CRCL 36 (SUTURE) IMPLANT
SUT PROLENE 5 0 C 1 36 (SUTURE) IMPLANT
SUT PROLENE 6 0 C 1 30 (SUTURE) IMPLANT
SUT PROLENE 6 0 CC (SUTURE) ×12 IMPLANT
SUT PROLENE 7 0 BV 1 (SUTURE) ×6 IMPLANT
SUT PROLENE 7 0 BV1 MDA (SUTURE) ×15 IMPLANT
SUT PROLENE 7.0 RB 3 (SUTURE) ×15 IMPLANT
SUT PROLENE 8 0 BV175 6 (SUTURE) ×15 IMPLANT
SUT SILK  1 MH (SUTURE)
SUT SILK 1 MH (SUTURE) IMPLANT
SUT SILK 2 0 SH CR/8 (SUTURE) IMPLANT
SUT SILK 3 0 SH CR/8 (SUTURE) IMPLANT
SUT STEEL 6MS V (SUTURE) ×3 IMPLANT
SUT STEEL STERNAL CCS#1 18IN (SUTURE) IMPLANT
SUT STEEL SZ 6 DBL 3X14 BALL (SUTURE) ×3 IMPLANT
SUT VIC AB 1 CTX 18 (SUTURE) ×6 IMPLANT
SUT VIC AB 1 CTX 36 (SUTURE)
SUT VIC AB 1 CTX36XBRD ANBCTR (SUTURE) IMPLANT
SUT VIC AB 2-0 CT1 27 (SUTURE) ×2
SUT VIC AB 2-0 CT1 TAPERPNT 27 (SUTURE) ×4 IMPLANT
SUT VIC AB 2-0 CTX 27 (SUTURE) IMPLANT
SUT VIC AB 3-0 SH 27 (SUTURE)
SUT VIC AB 3-0 SH 27X BRD (SUTURE) IMPLANT
SUT VIC AB 3-0 X1 27 (SUTURE) IMPLANT
SUT VICRYL 4-0 PS2 18IN ABS (SUTURE) IMPLANT
SUTURE E-PAK OPEN HEART (SUTURE) ×3 IMPLANT
SYR 5ML LL (SYRINGE) ×3 IMPLANT
SYSTEM SAHARA CHEST DRAIN ATS (WOUND CARE) ×3 IMPLANT
TOWEL OR 17X24 6PK STRL BLUE (TOWEL DISPOSABLE) ×6 IMPLANT
TOWEL OR 17X26 10 PK STRL BLUE (TOWEL DISPOSABLE) ×6 IMPLANT
TRAY FOLEY IC TEMP SENS 14FR (CATHETERS) ×3 IMPLANT
TUBE FEEDING 8FR 16IN STR KANG (MISCELLANEOUS) ×3 IMPLANT
TUBE SUCT INTRACARD DLP 20F (MISCELLANEOUS) ×3 IMPLANT
TUBING INSUFFLATION 10FT LAP (TUBING) ×3 IMPLANT
UNDERPAD 30X30 INCONTINENT (UNDERPADS AND DIAPERS) ×3 IMPLANT
WATER STERILE IRR 1000ML POUR (IV SOLUTION) ×6 IMPLANT
YANKAUER SUCT BULB TIP NO VENT (SUCTIONS) ×3 IMPLANT

## 2012-07-21 NOTE — Anesthesia Preprocedure Evaluation (Signed)
Anesthesia Evaluation  Patient identified by MRN, date of birth, ID band Patient awake    Reviewed: Allergy & Precautions, H&P , NPO status , Patient's Chart, lab work & pertinent test results, reviewed documented beta blocker date and time   Airway Mallampati: II TM Distance: >3 FB Neck ROM: full    Dental   Pulmonary neg pulmonary ROS,  breath sounds clear to auscultation        Cardiovascular hypertension, On Medications and On Home Beta Blockers + CAD and + Past MI + dysrhythmias Ventricular Fibrillation Rhythm:regular     Neuro/Psych  Headaches, negative psych ROS   GI/Hepatic negative GI ROS, Neg liver ROS,   Endo/Other  negative endocrine ROS  Renal/GU negative Renal ROS  negative genitourinary   Musculoskeletal   Abdominal   Peds  Hematology  (+) anemia ,   Anesthesia Other Findings See surgeon's H&P   Reproductive/Obstetrics negative OB ROS                           Anesthesia Physical Anesthesia Plan  ASA: IV  Anesthesia Plan: General   Post-op Pain Management:    Induction: Intravenous  Airway Management Planned: Oral ETT  Additional Equipment: Arterial line, CVP, PA Cath, TEE and Ultrasound Guidance Line Placement  Intra-op Plan:   Post-operative Plan: Post-operative intubation/ventilation  Informed Consent: I have reviewed the patients History and Physical, chart, labs and discussed the procedure including the risks, benefits and alternatives for the proposed anesthesia with the patient or authorized representative who has indicated his/her understanding and acceptance.   Dental Advisory Given  Plan Discussed with: CRNA and Surgeon  Anesthesia Plan Comments:         Anesthesia Quick Evaluation

## 2012-07-21 NOTE — Preoperative (Signed)
Beta Blockers   Reason not to administer Beta Blockers:Not Applicable 

## 2012-07-21 NOTE — Procedures (Signed)
Extubation Procedure Note  Patient Details:   Name: [name] DOB: 1945-12-16 MRN: [id number]   Airway Documentation:     Evaluation  O2 sats: stable throughout Complications: No apparent complications Patient did tolerate procedure well. Bilateral Breath Sounds: Clear   Yes  Patient extubated to 4L Susan Welch.  FVC 900, NIF -24.  Patient able to speak after procedure.  Sats 94%.  IS 500x5.  RT will continue to monitor.  Durwin Glaze 07/21/2012, 8:13 PM

## 2012-07-21 NOTE — Anesthesia Procedure Notes (Addendum)
Procedure Name: Intubation Date/Time: 07/21/2012 7:37 AM Performed by: Gayla Medicus Pre-anesthesia Checklist: Patient identified, Emergency Drugs available, Suction available and Patient being monitored Patient Re-evaluated:Patient Re-evaluated prior to inductionOxygen Delivery Method: Circle system utilized Preoxygenation: Pre-oxygenation with 100% oxygen Intubation Type: IV induction Ventilation: Mask ventilation without difficulty Laryngoscope Size: Miller and 2 Grade View: Grade I Tube type: Oral Tube size: 8.0 mm Number of attempts: 1 Airway Equipment and Method: Stylet Placement Confirmation: ETT inserted through vocal cords under direct vision,  positive ETCO2 and breath sounds checked- equal and bilateral Secured at: 22 cm Tube secured with: Tape Dental Injury: Teeth and Oropharynx as per pre-operative assessment

## 2012-07-21 NOTE — Transfer of Care (Signed)
Immediate Anesthesia Transfer of Care Note  Patient: Susan Welch  Procedure(s) Performed: Procedure(s) with comments: CORONARY ARTERY BYPASS GRAFTING (CABG) (N/A) - Times 4 using left internal mammary artery and endoscopically harvested left saphenous vein. INTRAOPERATIVE TRANSESOPHAGEAL ECHOCARDIOGRAM (N/A)  Patient Location: SICU  Anesthesia Type:General  Level of Consciousness: sedated and Patient remains intubated per anesthesia plan  Airway & Oxygen Therapy: Patient remains intubated per anesthesia plan and Patient placed on Ventilator (see vital sign flow sheet for setting)  Post-op Assessment: Report given to PACU RN and Post -op Vital signs reviewed and stable  Post vital signs: Reviewed and stable  Complications: No apparent anesthesia complications

## 2012-07-21 NOTE — OR Nursing (Signed)
Volunteer called @ 1319  to notify patient off pump

## 2012-07-21 NOTE — OR Nursing (Signed)
SICU first call @ 1334

## 2012-07-21 NOTE — Brief Op Note (Addendum)
07/15/2012 - 07/21/2012  12:34 PM  PATIENT:  Susan Welch  67 y.o. female  PRE-OPERATIVE DIAGNOSIS:  Coronary Artery Disease  POST-OPERATIVE DIAGNOSIS:  Coronary Artery Disease  PROCEDURE:  CORONARY ARTERY BYPASS GRAFTING x 4 (LIMA-LAD, SVG-Ramus-OM1, SVG-dRCA) ENDOSCOPIC VEIN HARVEST LEFT LEG  SURGEON:  Surgeon(s): Delight Ovens, MD  ASSISTANT: Coral Ceo, PA-C  ANESTHESIA:   general  PATIENT CONDITION:  ICU - intubated and hemodynamically stable.  PRE-OPERATIVE WEIGHT: 70 kg

## 2012-07-21 NOTE — Progress Notes (Signed)
Patient ID: Susan Welch, female   DOB: 23-Jul-1945, 67 y.o.   MRN: [id number]  Hemodynamically stable on Milrinone and neo. CI = 1.7 - 2.  Still on vent but waking up  Urine output ok CT output low  CBC    Component Value Date/Time   WBC 15.6* 07/21/2012 1449   WBC 8.5 09/20/2011 1821   RBC 3.29* 07/21/2012 1449   RBC 4.61 09/20/2011 1821   HGB 12.2 07/21/2012 1459   HGB 13.0 09/20/2011 1821   HCT 36.0 07/21/2012 1459   HCT 42.3 09/20/2011 1821   PLT 155 07/21/2012 1449   MCV 85.7 07/21/2012 1449   MCV 91.8 09/20/2011 1821   MCH 30.4 07/21/2012 1449   MCH 28.2 09/20/2011 1821   MCHC 35.5 07/21/2012 1449   MCHC 30.7* 09/20/2011 1821   RDW 13.5 07/21/2012 1449   LYMPHSABS 1.6 07/15/2012 1425   MONOABS 0.5 07/15/2012 1425   EOSABS 0.1 07/15/2012 1425   BASOSABS 0.0 07/15/2012 1425    BMET    Component Value Date/Time   NA 141 07/21/2012 1459   K 3.5 07/21/2012 1459   CL 97 07/21/2012 0415   CO2 31 07/21/2012 0415   GLUCOSE 133* 07/21/2012 1459   BUN 14 07/21/2012 0415   CREATININE 0.64 07/21/2012 0415   CREATININE 0.73 04/03/2012 0748   CALCIUM 9.3 07/21/2012 0415   GFRNONAA >90 07/21/2012 0415   GFRAA >90 07/21/2012 0415    A/P: wean vent as tolerated.

## 2012-07-22 ENCOUNTER — Inpatient Hospital Stay (HOSPITAL_COMMUNITY): Payer: Medicare Other

## 2012-07-22 LAB — CBC
HCT: 21.9 % — ABNORMAL LOW (ref 36.0–46.0)
MCH: 30.4 pg (ref 26.0–34.0)
MCHC: 35.2 g/dL (ref 30.0–36.0)
MCV: 86.6 fL (ref 78.0–100.0)
Platelets: 173 10*3/uL (ref 150–400)
RDW: 13.8 % (ref 11.5–15.5)
RDW: 14.3 % (ref 11.5–15.5)
WBC: 10.5 10*3/uL (ref 4.0–10.5)

## 2012-07-22 LAB — GLUCOSE, CAPILLARY
Glucose-Capillary: 144 mg/dL — ABNORMAL HIGH (ref 70–99)
Glucose-Capillary: 130 mg/dL — ABNORMAL HIGH (ref 70–99)
Glucose-Capillary: 90 mg/dL (ref 70–99)
Glucose-Capillary: 83 mg/dL (ref 70–99)
Glucose-Capillary: 79 mg/dL (ref 70–99)
Glucose-Capillary: 111 mg/dL — ABNORMAL HIGH (ref 70–99)
Glucose-Capillary: 124 mg/dL — ABNORMAL HIGH (ref 70–99)

## 2012-07-22 LAB — CREATININE, SERUM
GFR calc Af Amer: >90 mL/min (ref >90)
GFR calc non Af Amer: >90 mL/min (ref >90)

## 2012-07-22 LAB — POCT I-STAT, CHEM 8
Calcium, Ion: 1.03 mmol/L — ABNORMAL LOW (ref 1.13–1.30)
Glucose, Bld: 134 mg/dL — ABNORMAL HIGH (ref 70–99)
HCT: 25 % — ABNORMAL LOW (ref 36.0–46.0)
Hemoglobin: 8.5 g/dL — ABNORMAL LOW (ref 12.0–15.0)
Potassium: 4 mEq/L (ref 3.5–5.1)

## 2012-07-22 LAB — MAGNESIUM
Magnesium: 2.7 mg/dL — ABNORMAL HIGH (ref 1.5–2.5)
Magnesium: 2.5 mg/dL (ref 1.5–2.5)

## 2012-07-22 LAB — BASIC METABOLIC PANEL
Chloride: 106 mEq/L (ref 96–112)
Creatinine, Ser: 0.6 mg/dL (ref 0.50–1.10)
GFR calc Af Amer: >90 mL/min (ref >90)
Potassium: 4.1 mEq/L (ref 3.5–5.1)

## 2012-07-22 MED ORDER — INSULIN ASPART 100 UNIT/ML ~~LOC~~ SOLN: 0.0000 [IU] | SUBCUTANEOUS | Status: AC

## 2012-07-22 MED ORDER — MILRINONE IN DEXTROSE 20 MG/100ML IV SOLN: 0.3000 ug/kg/min | INTRAVENOUS | Status: AC

## 2012-07-22 MED ORDER — KETOROLAC TROMETHAMINE 15 MG/ML IJ SOLN
15.0000 mg | Freq: Three times a day (TID) | INTRAMUSCULAR | Status: AC
  Administered 2012-07-22 – 2012-07-23 (×2): 15 mg via INTRAVENOUS
  Filled 2012-07-22 (×2): qty 1

## 2012-07-22 MED ORDER — ENOXAPARIN SODIUM 30 MG/0.3ML ~~LOC~~ SOLN
30.0000 mg | SUBCUTANEOUS | Status: DC
  Administered 2012-07-22 – 2012-07-23 (×2): 30 mg via SUBCUTANEOUS
  Filled 2012-07-22 (×3): qty 0.3

## 2012-07-22 MED ORDER — FUROSEMIDE 10 MG/ML IJ SOLN
40.0000 mg | Freq: Once | INTRAMUSCULAR | Status: AC
  Administered 2012-07-22: 40 mg via INTRAVENOUS

## 2012-07-22 MED ORDER — INSULIN ASPART 100 UNIT/ML ~~LOC~~ SOLN
0.0000 [IU] | SUBCUTANEOUS | Status: DC
  Administered 2012-07-22 – 2012-07-23 (×3): 2 [IU] via SUBCUTANEOUS

## 2012-07-22 MED ORDER — MILRINONE IN DEXTROSE 20 MG/100ML IV SOLN
INTRAVENOUS | Status: AC
  Filled 2012-07-22: qty 100

## 2012-07-22 MED ORDER — KETOROLAC TROMETHAMINE 15 MG/ML IJ SOLN: 15.0000 mg | Freq: Once | INTRAMUSCULAR | Status: AC

## 2012-07-22 MED ORDER — INSULIN ASPART 100 UNIT/ML ~~LOC~~ SOLN: 0.0000 [IU] | SUBCUTANEOUS | Status: DC

## 2012-07-22 MED ORDER — KETOROLAC TROMETHAMINE 15 MG/ML IJ SOLN
INTRAMUSCULAR | Status: AC
  Administered 2012-07-22: 15 mg via INTRAVENOUS
  Filled 2012-07-22: qty 1

## 2012-07-22 MED FILL — Sodium Chloride Irrigation Soln 0.9%: Qty: 3000 | Status: AC

## 2012-07-22 MED FILL — Electrolyte-R (PH 7.4) Solution: INTRAVENOUS | Qty: 1000 | Status: AC

## 2012-07-22 MED FILL — Lidocaine HCl IV Inj 20 MG/ML: INTRAVENOUS | Qty: 5 | Status: AC

## 2012-07-22 MED FILL — Sodium Chloride IV Soln 0.9%: INTRAVENOUS | Qty: 1000 | Status: AC

## 2012-07-22 MED FILL — Sodium Bicarbonate IV Soln 8.4%: INTRAVENOUS | Qty: 50 | Status: AC

## 2012-07-22 MED FILL — Potassium Chloride Inj 2 mEq/ML: INTRAVENOUS | Qty: 40 | Status: AC

## 2012-07-22 MED FILL — Heparin Sodium (Porcine) Inj 1000 Unit/ML: INTRAMUSCULAR | Qty: 20 | Status: AC

## 2012-07-22 MED FILL — Magnesium Sulfate Inj 50%: INTRAMUSCULAR | Qty: 10 | Status: AC

## 2012-07-22 MED FILL — Heparin Sodium (Porcine) Inj 1000 Unit/ML: INTRAMUSCULAR | Qty: 30 | Status: AC

## 2012-07-22 MED FILL — Mannitol IV Soln 20%: INTRAVENOUS | Qty: 500 | Status: AC

## 2012-07-22 NOTE — Op Note (Signed)
NAMEMarland Kitchen  Susan Welch, Susan Welch NO.:  [id number]  MEDICAL RECORD NO.:  [id number]  LOCATION:  2315                         FACILITY:  [facility]  PHYSICIAN:  Sheliah Plane, MD    DATE OF BIRTH:  February 20, 1945  DATE OF PROCEDURE:  07/22/2012 DATE OF DISCHARGE:                              OPERATIVE REPORT   PREOPERATIVE DIAGNOSES:  Recent ventricular fibrillation arrest, three- vessel coronary artery disease.  POSTOPERATIVE DIAGNOSES:  Recent ventricular fibrillation arrest, three- vessel coronary artery disease..  SURGICAL PROCEDURE:  Coronary artery bypass grafting x4 with the left internal mammary to the left anterior descending coronary artery, reverse saphenous vein graft sequentially to the ramus intermediate and first obtuse marginal, reversed saphenous vein graft to the distal right coronary artery with left leg endo vein harvesting.  SURGEON:  Sheliah Plane, MD  FIRST ASSISTANT:  Coral Ceo, P.A.  BRIEF HISTORY:  The patient is a 67 year old female who the week prior was undergoing a right knee replacement, and on induction of anesthesia had a VFib arrest.  She was resuscitated and successfully completed her knee replacement.  Postoperatively, a cardiac catheterization was performed by Dr. Antoine Poche which demonstrated severe three-vessel coronary artery disease with total occlusion of the right coronary artery with collateral filling to the distal vessel.  A 90% proximal LAD lesion, also a moderate-sized ramus branch with an 80% lesion, and 70- 80% disease in the circumflex.  Overall, ventricular function was depressed with 40% ejection fraction with inferior hypokinesis.  Because of the patient's significant three-vessel coronary artery disease, coronary artery bypass grafting was recommended to the patient who agreed and signed informed consent.  DESCRIPTION OF PROCEDURE:  With Swan-Ganz and arterial line monitors in place, the patient underwent general  endotracheal anesthesia without incident.  Skin of chest and legs prepped with Betadine and draped in usual sterile manner.  Because of the recent right knee surgery, we proceeded with endo vein harvesting from the left leg.  In addition, the median sternotomy was performed.  Left internal mammary artery was dissected down as a pedicle graft.  The distal artery was divided and had good free flow.  Pericardium was opened.  The patient did have evidence of inferior hypokinesis and left ventricular hypertrophy.  A TEE was performed and findings are dictated under separate note by Dr. Gelene Mink.  The patient was systemically heparinized.  The ascending aorta was cannulated.  The right atrium was cannulated and aortic root vent cardioplegia needle was placed in the ascending aorta.  The patient was placed on cardiopulmonary bypass 2.4 L/minute/m2.  Sites of anastomosis were selected and dissected out of the epicardium.  Because the patient started preoperatively with significant anemia, packed red blood cells were added to the pump.  The patient's body temperature was cooled to 32 degrees.  Aortic crossclamp was applied and 800 mL cold blood potassium cardioplegia was administered with diastolic arrest of the heart.  Myocardial septal temperature was monitored throughout crossclamp.  Attention was turned first to the distal right coronary artery which was opened, had a significant lumen admitted at 1.5 mm probe distally using a running 7-0 Prolene distal anastomosis was performed.  The heart was then elevated and  the ramus branch was opened in a side-to-side anastomosis was carried out with a second reversed saphenous vein graft.  The distal extent of the same vein was then carried to the smaller first obtuse marginal vessel.  This vessel was approximately 1.3-1.4 mm in size.  Using a running 7-0 Prolene, distal anastomosis was performed.  Intermittently cold blood cardioplegia  was administered down the vein grafts.  The attention was then turned to the left anterior descending coronary artery, which was the proximal two- thirds of the vessel was intramyocardial.  The distal third of the vessel was identified and opened was relatively small, admitted at 1 mm probe distally and 1.5 mm probe proximally using a running 8-0 Prolene, left internal mammary artery was anastomosed to the left anterior descending coronary artery without difficulty.  With release of the bulldog on the mammary artery with rise in myocardial septal temperature, the bulldog was placed back on the mammary artery with crossclamp still in place, two punch aortotomies were performed and each of the two vein grafts were anastomosed to the ascending aorta.  Air was evacuated from the grafts.  Bulldog on the mammary artery was removed with prompt rise in myocardial septal temperature.  Aortic crossclamp was removed.  Total cross-clamp time of 101 minutes, the patient spontaneously converted to a sinus rhythm.  She was started on low-dose milrinone and low-dose dopamine; however, because of increased heart rate the dopamine was stopped.  The patient was then ventilated and weaned from cardiopulmonary bypass without difficulty  with the total pump time of 139 minutes.  Sites anastomosed were inspected free of bleeding.  She was decannulated in usual fashion.  Protamine sulfate was administered with operative field hemostatic.  A left pleural tube and Blake mediastinal drain were left in place.  Pericardium was loosely reapproximated.  Sternum was closed with #6 stainless steel wire. Fascia was closed with interrupted 0 Vicryl, running 3-0 Vicryl and subcutaneous tissue, and 4-0 subcuticular stitch in skin edges.  Dry dressings were applied.  Sponge and needle count was reported as correct at completion of procedure.  The patient tolerated the procedure well without obvious complication and was  transferred to the Surgical Intensive Care Unit for further postoperative care.     Sheliah Plane, MD     EG/MEDQ  D:  07/22/2012  T:  07/22/2012  Job:  [id number]

## 2012-07-22 NOTE — Progress Notes (Signed)
Subjective: 1 Day Post-Op Procedure(s) (LRB): CORONARY ARTERY BYPASS GRAFTING (CABG) (N/A) INTRAOPERATIVE TRANSESOPHAGEAL ECHOCARDIOGRAM (N/A) Patient reports pain as 4 on 0-10 scale relating to right leg; denies calf pain  Objective: Vital signs in last 24 hours: Temp:  [96.4 F (35.8 C)-99.9 F (37.7 C)] 98.6 F (37 C) (07/16 1124) Pulse Rate:  [73-104] 97 (07/16 1400) Resp:  [0-26] 26 (07/16 1400) BP: (83-125)/(47-71) 109/59 mmHg (07/16 1400) SpO2:  [89 %-100 %] 99 % (07/16 1400) Arterial Line BP: (89-147)/(44-66) 145/53 mmHg (07/16 1000) FiO2 (%):  [40 %] 40 % (07/15 1909) Weight:  [77.1 kg (169 lb 15.6 oz)] 77.1 kg (169 lb 15.6 oz) (07/16 0500)  Intake/Output from previous day: 07/15 0701 - 07/16 0700 In: 6183.9 [I.V.:3907.9; Blood:326; IV Piggyback:1950] Out: 4295 [Urine:3835; Emesis/NG output:50; Drains:20; Chest Tube:390] Intake/Output this shift: Total I/O In: 332.9 [I.V.:332.9] Out: 1450 [Urine:1300; Drains:10; Chest Tube:140]   Recent Labs  07/21/12 1449 07/21/12 1459 07/21/12 1950 07/21/12 2024 07/22/12 0500  HGB 10.0* 12.2 8.5* 9.0* 7.7*    Recent Labs  07/21/12 2024 07/22/12 0500  WBC 10.9* 10.5  RBC 2.99* 2.54*  HCT 25.6* 21.7*  PLT 181 173    Recent Labs  07/21/12 0415  07/21/12 1950 07/21/12 2024 07/22/12 0500  NA 136  < > 139  --  138  K 3.8  < > 4.5  --  4.1  CL 97  --  106  --  106  CO2 31  --   --   --  24  BUN 14  --  10  --  12  CREATININE 0.64  --  0.60 0.51 0.60  GLUCOSE 106*  < > 135*  --  105*  CALCIUM 9.3  --   --   --  7.3*  < > = values in this interval not displayed.  Recent Labs  07/21/12 1449  INR 1.63*    Sensation intact distally Dorsiflexion/Plantar flexion intact Incision: no drainage No cellulitis present Compartment soft Staples intact No effusion  Assessment/Plan: 1 Day Post-Op Procedure(s) (LRB): CORONARY ARTERY BYPASS GRAFTING (CABG) (N/A) INTRAOPERATIVE TRANSESOPHAGEAL ECHOCARDIOGRAM  (N/A) Up with therapy, weight bearing as tolerated on right leg Restart CPM if ok with cardiology Dressing per the patient's comfort to prevent irritation of staples Ok to use SCDs on right leg Would support strongly the transfer to CIR if approved as they would be better able to provide both cardiac and ortho rehab  Susan Welch B 07/22/2012, 3:25 PM

## 2012-07-22 NOTE — Progress Notes (Signed)
Patient ID: Susan Welch, female   DOB: 09/07/1945, 67 y.o.   MRN: [id number] EVENING ROUNDS NOTE :     [address]       [place] [address]             [phone]                 1 Day Post-Op Procedure(s) (LRB): CORONARY ARTERY BYPASS GRAFTING (CABG) (N/A) INTRAOPERATIVE TRANSESOPHAGEAL ECHOCARDIOGRAM (N/A)  Total Length of Stay:  LOS: 7 days  BP 110/59  Pulse 94  Temp(Src) 98.3 F (36.8 C) (Oral)  Resp 19  Ht 5\' 2"  (1.575 m)  Wt 169 lb 15.6 oz (77.1 kg)  BMI 31.08 kg/m2  SpO2 96%  .Intake/Output     07/16 0701 - 07/17 0700   P.O. 720   I.V. (mL/kg) 432.9 (5.6)   Blood    IV Piggyback    Total Intake(mL/kg) 1152.9 (15)   Urine (mL/kg/hr) 1390 (1.5)   Emesis/NG output    Drains 10 (0)   Chest Tube 190 (0.2)   Total Output 1590   Net -437.2         . sodium chloride 20 mL/hr at 07/22/12 0700  . sodium chloride    . sodium chloride 250 mL (07/22/12 0508)  . dexmedetomidine Stopped (07/22/12 0100)  . lactated ringers Stopped (07/22/12 1100)  . nitroGLYCERIN    . phenylephrine (NEO-SYNEPHRINE) Adult infusion Stopped (07/22/12 0600)     Lab Results  Component Value Date   WBC 11.2* 07/22/2012   HGB 8.5* 07/22/2012   HCT 25.0* 07/22/2012   PLT 185 07/22/2012   GLUCOSE 134* 07/22/2012   CHOL 207* 07/16/2012   TRIG 149 07/16/2012   HDL 50 07/16/2012   LDLCALC 127* 07/16/2012   ALT 22 09/20/2011   AST 19 09/20/2011   NA 137 07/22/2012   K 4.0 07/22/2012   CL 99 07/22/2012   CREATININE 0.80 07/22/2012   BUN 13 07/22/2012   CO2 24 07/22/2012   TSH 1.789 07/15/2012   INR 1.63* 07/21/2012   HGBA1C 5.8* 07/20/2012   Stable day Stared pt  Delight Ovens MD  Beeper [phone] Office [phone] 07/22/2012 7:08 PM

## 2012-07-22 NOTE — Care Management Note (Signed)
    Page 1 of 1   07/24/2012     5:06:29 PM   CARE MANAGEMENT NOTE 07/24/2012  Patient:  Susan, Welch   Account Number:  [id number]  Date Initiated:  07/16/2012  Documentation initiated by:  Avie Arenas  Subjective/Objective Assessment:   planned knee surgery - intraoperative cardiac arrest.     Action/Plan:   Anticipated DC Date:  07/24/2012   Anticipated DC Plan:  IP REHAB FACILITY  In-house referral  Clinical Social Worker      DC Planning Services  CM consult      Choice offered to / List presented to:             Status of service:  Completed, signed off Medicare Important Message given?   (If response is "NO", the following Medicare IM given date fields will be blank) Date Medicare IM given:   Date Additional Medicare IM given:    Discharge Disposition:  IP REHAB FACILITY  Per UR Regulation:  Reviewed for med. necessity/level of care/duration of stay  If discussed at Long Length of Stay Meetings, dates discussed:   07/21/2012    Comments:  Contact: Morimoto,Lance Son [phone]  07/24/12 Susan Strader,RN,BSN [phone] PT APPROVED FOR CIR, AND READY, PER [facility]DS FOR DC TODAY TO IP REHAB.  07-22-12 2:45pm Avie Arenas, RNBSN [phone] Talked with patient after knee surgery - post coding. States son lives in [place] and will not be with her post op. She has talked with [facility] and arranged to go there post op.  Now she is post op CABG x4.  Groggy this am.  Not sure if plan is still to go to [facility] - states daughter in law and SW talked and have a plan.  it appears that CIR may be option but will need PT/OT post op input. CM will continue to follow.

## 2012-07-22 NOTE — Anesthesia Postprocedure Evaluation (Signed)
Anesthesia Post Note  Patient: Susan Welch  Procedure(s) Performed: Procedure(s) (LRB): CORONARY ARTERY BYPASS GRAFTING (CABG) (N/A) INTRAOPERATIVE TRANSESOPHAGEAL ECHOCARDIOGRAM (N/A)  Anesthesia type: general  Patient location: PACU  Post pain: Pain level controlled  Post assessment: Patient's Cardiovascular Status Stable  Last Vitals:  Filed Vitals:   07/22/12 0700  BP: 96/55  Pulse: 100  Temp: 37.1 C  Resp: 17    Post vital signs: Reviewed and stable  Level of consciousness: sedated  Complications: No apparent anesthesia complications

## 2012-07-22 NOTE — Evaluation (Signed)
Physical Therapy Evaluation Patient Details Name: Susan Welch MRN: [id number] DOB: [birth year] Today's Date: 07/22/2012 Time: 6295-2841 PT Time Calculation (min): 26 min  PT Assessment / Plan / Recommendation History of Present Illness  Pt was scheduled TKA. Pt with introperative arrest, cardiac cath, and now is s/p CABGx4.   Clinical Impression  Pt demonstrates deficits in functional mobility as indicated. Patient will benefit from continued skilled PT. rec CIR upon discharge from acute care. Will continue to follow as indicated.    PT Assessment  Patient needs continued PT services    Follow Up Recommendations  CIR       Barriers to Discharge Decreased caregiver support      Equipment Recommendations   (TBD)    Recommendations for Other Services Rehab consult;OT consult   Frequency Min 3X/week    Precautions / Restrictions Precautions Precautions: Knee;Sternal Precaution Comments: educated patient on sternal and knee precautions, will continue to enforce Restrictions Weight Bearing Restrictions: No RLE Weight Bearing: Weight bearing as tolerated Other Position/Activity Restrictions: Sternal precautions   Pertinent Vitals/Pain 5/10 chest, 4/10 knee      Mobility  Bed Mobility Bed Mobility: Sitting - Scoot to Edge of Bed;Sit to Supine Sitting - Scoot to Edge of Bed: 1: +2 Total assist Sitting - Scoot to Edge of Bed: Patient Percentage: 30% Sit to Supine: 1: +2 Total assist;HOB flat Sit to Supine: Patient Percentage: 0% Transfers Transfers: Sit to Stand;Stand to Sit Sit to Stand: 1: +2 Total assist;From chair/3-in-1 Sit to Stand: Patient Percentage: 40% Stand to Sit: 1: +2 Total assist Stand to Sit: Patient Percentage: 70% Details for Transfer Assistance: VCs for technique using rocker moethod and compliance with sternal precaution Ambulation/Gait Ambulation/Gait Assistance: 1: +2 Total assist Ambulation/Gait: Patient Percentage: 80% Ambulation Distance  (Feet): 6 Feet Assistive device:  (pushing wc) Ambulation/Gait Assistance Details: patient with increased pain and diffiuclty adhering to sternal precautions, unable to bear increased weight through RLE seocndary to TKA. very minimal steps tolerated Gait Pattern: Step-to pattern Gait velocity: decreased    Exercises     PT Diagnosis: Difficulty walking;Abnormality of gait;Generalized weakness;Acute pain  PT Problem List: Decreased strength;Decreased range of motion;Decreased activity tolerance;Decreased balance;Decreased mobility;Decreased knowledge of use of DME;Decreased safety awareness;Decreased knowledge of precautions;Cardiopulmonary status limiting activity;Pain PT Treatment Interventions: DME instruction;Gait training;Stair training;Functional mobility training;Therapeutic activities;Therapeutic exercise;Balance training;Patient/family education     PT Goals(Current goals can be found in the care plan section) Acute Rehab PT Goals Patient Stated Goal: to get better PT Goal Formulation: With patient Time For Goal Achievement: 08/05/12 Potential to Achieve Goals: Fair  Visit Information  Last PT Received On: 07/22/12 Assistance Needed: +2 History of Present Illness: Pt was scheduled TKA. Pt with introperative arrest, cardiac cath, and now is s/p CABGx4.        Prior Functioning  Home Living Family/patient expects to be discharged to:: Private residence Living Arrangements: Alone Available Help at Discharge: Family;Available PRN/intermittently Type of Home: Apartment Home Access: Level entry Home Layout: One level Prior Function Level of Independence: Independent Communication Communication: HOH (has tinnitus) Dominant Hand: Right    Cognition  Cognition Arousal/Alertness: Awake/alert Behavior During Therapy: WFL for tasks assessed/performed Overall Cognitive Status: Within Functional Limits for tasks assessed    Extremity/Trunk Assessment Upper Extremity  Assessment Upper Extremity Assessment: Defer to OT evaluation Lower Extremity Assessment Lower Extremity Assessment: Generalized weakness;RLE deficits/detail RLE Deficits / Details: R TKA, limited ROm RLE: Unable to fully assess due to pain   Balance    End of  Session PT - End of Session Equipment Utilized During Treatment: Gait belt;Oxygen Activity Tolerance: Patient limited by fatigue;Patient limited by pain Patient left: in bed;with call bell/phone within reach;with nursing/sitter in room Nurse Communication: Mobility status;Precautions;Weight bearing status CPM Right Knee CPM Right Knee: Off  GP     Fabio Asa 07/22/2012, 4:29 PM Charlotte Crumb, PT DPT  [phone]

## 2012-07-22 NOTE — Progress Notes (Signed)
Patient ID: Susan Welch, female   DOB: 25-Mar-1945, 67 y.o.   MRN: [id number] [facility] DAILY ICU PROGRESS NOTE                   [address]            [place] [address]          [phone]   1 Day Post-Op Procedure(s) (LRB): CORONARY ARTERY BYPASS GRAFTING (CABG) (N/A) INTRAOPERATIVE TRANSESOPHAGEAL ECHOCARDIOGRAM (N/A)  Total Length of Stay:  LOS: 7 days   Subjective: Alert and neuro intact Good pain control   Objective: Vital signs in last 24 hours: Temp:  [95.5 F (35.3 C)-99.9 F (37.7 C)] 98.8 F (37.1 C) (07/16 0700) Pulse Rate:  [73-115] 100 (07/16 0700) Cardiac Rhythm:  [-] Normal sinus rhythm (07/15 2000) Resp:  [0-26] 17 (07/16 0700) BP: (83-111)/(47-71) 96/55 mmHg (07/16 0700) SpO2:  [89 %-100 %] 94 % (07/16 0700) Arterial Line BP: (89-147)/(44-69) 124/52 mmHg (07/16 0700) FiO2 (%):  [40 %-50 %] 40 % (07/15 1909) Weight:  [169 lb 15.6 oz (77.1 kg)] 169 lb 15.6 oz (77.1 kg) (07/16 0500)  Filed Weights   07/20/12 0352 07/21/12 0440 07/22/12 0500  Weight: 158 lb 1.1 oz (71.7 kg) 154 lb 1.6 oz (69.9 kg) 169 lb 15.6 oz (77.1 kg)    Weight change: 15 lb 14 oz (7.2 kg)   Hemodynamic parameters for last 24 hours: PAP: (25-51)/(11-29) 39/21 mmHg CO:  [2.8 L/min-7.1 L/min] 5.7 L/min CI:  [1.7 L/min/m2-4.2 L/min/m2] 3.4 L/min/m2  Intake/Output from previous day: 07/15 0701 - 07/16 0700 In: 6183.9 [I.V.:3907.9; Blood:326; IV Piggyback:1950] Out: 4295 [Urine:3835; Emesis/NG output:50; Drains:20; Chest Tube:390]  Intake/Output this shift: Total I/O In: -  Out: 35 [Urine:35]  Current Meds: Scheduled Meds: . acetaminophen  1,000 mg Oral Q6H   Or  . acetaminophen (TYLENOL) oral liquid 160 mg/5 mL  975 mg Per Tube Q6H  . aspirin EC  325 mg Oral Daily   Or  . aspirin  324 mg Per Tube Daily  . bisacodyl  10 mg Oral Daily   Or  . bisacodyl  10 mg Rectal Daily  . cefUROXime (ZINACEF)  IV  1.5 g Intravenous Q12H  . docusate sodium  200 mg Oral  Daily  . famotidine (PEPCID) IV  20 mg Intravenous Q12H  . insulin aspart  0-24 Units Subcutaneous Q2H   Followed by  . insulin aspart  0-24 Units Subcutaneous Q4H  . magnesium sulfate  4 g Intravenous Once  . metoCLOPramide (REGLAN) injection  10 mg Intravenous Q6H  . metoprolol tartrate  12.5 mg Oral BID   Or  . metoprolol tartrate  12.5 mg Per Tube BID  . [START ON 07/23/2012] pantoprazole  40 mg Oral Daily  . rosuvastatin  5 mg Oral q1800  . sodium chloride  3 mL Intravenous Q12H   Continuous Infusions: . sodium chloride 20 mL/hr at 07/22/12 0700  . sodium chloride    . sodium chloride 250 mL (07/22/12 0508)  . dexmedetomidine Stopped (07/22/12 0100)  . lactated ringers 20 mL/hr at 07/22/12 0700  . milrinone 0.3 mcg/kg/min (07/22/12 0700)  . nitroGLYCERIN    . phenylephrine (NEO-SYNEPHRINE) Adult infusion Stopped (07/22/12 0600)   PRN Meds:.albumin human, metoprolol, midazolam, morphine injection, ondansetron (ZOFRAN) IV, oxyCODONE, sodium chloride  General appearance: alert and cooperative Neurologic: intact Heart: regular rate and rhythm, S1, S2 normal, no murmur, click, rub or gallop Lungs: clear to auscultation bilaterally Abdomen: soft, non-tender;  bowel sounds normal; no masses,  no organomegaly Extremities: minmial drainage from left leg drain Wound: sternum stable  Lab Results: CBC: Recent Labs  07/21/12 2024 07/22/12 0500  WBC 10.9* 10.5  HGB 9.0* 7.7*  HCT 25.6* 21.7*  PLT 181 173   BMET:  Recent Labs  07/21/12 0415  07/21/12 1950 07/21/12 2024 07/22/12 0500  NA 136  < > 139  --  138  K 3.8  < > 4.5  --  4.1  CL 97  --  106  --  106  CO2 31  --   --   --  24  GLUCOSE 106*  < > 135*  --  105*  BUN 14  --  10  --  12  CREATININE 0.64  --  0.60 0.51 0.60  CALCIUM 9.3  --   --   --  7.3*  < > = values in this interval not displayed.  PT/INR:  Recent Labs  07/21/12 1449  LABPROT 18.9*  INR 1.63*   Radiology: Dg Chest Portable 1 View In  Am  07/22/2012   *RADIOLOGY REPORT*  Clinical Data: Postop CABG, follow-up  PORTABLE CHEST - 1 VIEW  Comparison: Portable chest x-ray of 07/21/2012  Findings: The endotracheal tube appears to have been removed. Aeration has diminished slightly.  There is cardiomegaly present with basilar atelectasis and probable mild pulmonary vascular congestion remaining.  Left chest tube is present and no pneumothorax is seen.  The Swan-Ganz catheter tip is in the pulmonary outflow tract region.  IMPRESSION: Endotracheal tube removed with decrease in aeration and increase in basilar atelectasis.  Stable mild congestion.   Original Report Authenticated By: Dwyane Dee, M.D.   Dg Chest Portable 1 View  07/21/2012   *RADIOLOGY REPORT*  Clinical Data: Status post CABG.  PORTABLE CHEST - 1 VIEW  Comparison: Chest x-ray 07/19/2012.  Findings: An endotracheal tube is in place with tip 4.7 cm above the carina. Nasogastric tube extends into the mid stomach.  Right internal jugular central venous Cordis through which a Swan Ganz catheter has been passed into the distal pulmonic trunk.  Status post median sternotomy for CABG.  Left-sided chest tube in position with tip directed into the left apex.  Lung volumes are slightly low with bibasilar opacities most compatible with mild postoperative subsegmental atelectasis.  Small left pleural effusion.  No pneumothorax.  No evidence of pulmonary edema. Cardiomediastinal silhouette is within normal limits. Epicardial pacing wires are noted.  IMPRESSION: 1.  Postoperative changes and support apparatus, as above. 2.  Low lung volumes with mild bibasilar (left greater than right) subsegmental atelectasis and small left pleural effusion.   Original Report Authenticated By: Trudie Reed, M.D.     Assessment/Plan: S/P Procedure(s) (LRB): CORONARY ARTERY BYPASS GRAFTING (CABG) (N/A) INTRAOPERATIVE TRANSESOPHAGEAL ECHOCARDIOGRAM (N/A) Mobilize Diuresis Diabetes control d/c  tubes/lines Continue foley due to strict I&O, patient in ICU and urinary output monitoring See progression orders Start pt per orth limitations Expected Acute  Blood - loss Anemia     Gio Janoski B 07/22/2012 8:26 AM

## 2012-07-23 ENCOUNTER — Inpatient Hospital Stay (HOSPITAL_COMMUNITY): Payer: Medicare Other

## 2012-07-23 ENCOUNTER — Encounter (HOSPITAL_COMMUNITY): Payer: Self-pay | Admitting: Cardiothoracic Surgery

## 2012-07-23 LAB — CBC
HCT: 26.6 % — ABNORMAL LOW (ref 36.0–46.0)
Hemoglobin: 9.1 g/dL — ABNORMAL LOW (ref 12.0–15.0)
MCH: 29.8 pg (ref 26.0–34.0)
MCHC: 34.2 g/dL (ref 30.0–36.0)
MCV: 87.2 fL (ref 78.0–100.0)
Platelets: 206 10*3/uL (ref 150–400)
RBC: 3.05 MIL/uL — ABNORMAL LOW (ref 3.87–5.11)
RDW: 14.8 % (ref 11.5–15.5)
WBC: 16.6 10*3/uL — ABNORMAL HIGH (ref 4.0–10.5)

## 2012-07-23 LAB — BASIC METABOLIC PANEL
BUN: 19 mg/dL (ref 6–23)
CO2: 23 mEq/L (ref 19–32)
Calcium: 8.3 mg/dL — ABNORMAL LOW (ref 8.4–10.5)
Chloride: 100 mEq/L (ref 96–112)
Creatinine, Ser: 0.58 mg/dL (ref 0.50–1.10)
GFR calc Af Amer: >90 mL/min (ref >90)
GFR calc non Af Amer: >90 mL/min (ref >90)
Glucose, Bld: 109 mg/dL — ABNORMAL HIGH (ref 70–99)
Potassium: 4.3 mEq/L (ref 3.5–5.1)
Sodium: 135 mEq/L (ref 135–145)

## 2012-07-23 LAB — GLUCOSE, CAPILLARY
Glucose-Capillary: 101 mg/dL — ABNORMAL HIGH (ref 70–99)
Glucose-Capillary: 97 mg/dL (ref 70–99)
Glucose-Capillary: 100 mg/dL — ABNORMAL HIGH (ref 70–99)
Glucose-Capillary: 125 mg/dL — ABNORMAL HIGH (ref 70–99)
Glucose-Capillary: 117 mg/dL — ABNORMAL HIGH (ref 70–99)

## 2012-07-23 MED ORDER — BISACODYL 10 MG RE SUPP: 10.0000 mg | Freq: Every day | RECTAL | Status: DC | PRN

## 2012-07-23 MED ORDER — MOVING RIGHT ALONG BOOK
Freq: Once | Status: AC
  Administered 2012-07-23: 15:00:00
  Filled 2012-07-23: qty 1

## 2012-07-23 MED ORDER — SODIUM CHLORIDE 0.9 % IV SOLN: 250.0000 mL | INTRAVENOUS | Status: DC | PRN

## 2012-07-23 MED ORDER — TRAMADOL HCL 50 MG PO TABS
50.0000 mg | ORAL_TABLET | ORAL | Status: DC | PRN
  Administered 2012-07-23 – 2012-07-24 (×2): 100 mg via ORAL
  Filled 2012-07-23 (×2): qty 2

## 2012-07-23 MED ORDER — LISINOPRIL 2.5 MG PO TABS
2.5000 mg | ORAL_TABLET | Freq: Every day | ORAL | Status: DC
  Administered 2012-07-23 – 2012-07-24 (×2): 2.5 mg via ORAL
  Filled 2012-07-23 (×2): qty 1

## 2012-07-23 MED ORDER — ONDANSETRON HCL 4 MG/2ML IJ SOLN: 4.0000 mg | Freq: Four times a day (QID) | INTRAMUSCULAR | Status: DC | PRN

## 2012-07-23 MED ORDER — ZOLPIDEM TARTRATE 5 MG PO TABS: 5.0000 mg | ORAL_TABLET | Freq: Every evening | ORAL | Status: DC | PRN

## 2012-07-23 MED ORDER — METOPROLOL TARTRATE 12.5 MG HALF TABLET
12.5000 mg | ORAL_TABLET | Freq: Two times a day (BID) | ORAL | Status: DC
  Administered 2012-07-23 (×2): 12.5 mg via ORAL
  Filled 2012-07-23 (×4): qty 1

## 2012-07-23 MED ORDER — ASPIRIN EC 325 MG PO TBEC
325.0000 mg | DELAYED_RELEASE_TABLET | Freq: Every day | ORAL | Status: DC
  Administered 2012-07-23 – 2012-07-24 (×2): 325 mg via ORAL
  Filled 2012-07-23 (×2): qty 1

## 2012-07-23 MED ORDER — POTASSIUM CHLORIDE CRYS ER 20 MEQ PO TBCR
20.0000 meq | EXTENDED_RELEASE_TABLET | Freq: Every day | ORAL | Status: DC
  Administered 2012-07-23 – 2012-07-24 (×2): 20 meq via ORAL
  Filled 2012-07-23 (×2): qty 1

## 2012-07-23 MED ORDER — ONDANSETRON HCL 4 MG PO TABS: 4.0000 mg | ORAL_TABLET | Freq: Four times a day (QID) | ORAL | Status: DC | PRN

## 2012-07-23 MED ORDER — OXYCODONE HCL 5 MG PO TABS
5.0000 mg | ORAL_TABLET | ORAL | Status: DC | PRN
Start: 2012-07-23 — End: 2012-07-24
  Filled 2012-07-23: qty 1

## 2012-07-23 MED ORDER — DOCUSATE SODIUM 100 MG PO CAPS
200.0000 mg | ORAL_CAPSULE | Freq: Every day | ORAL | Status: DC
  Administered 2012-07-24: 200 mg via ORAL
  Filled 2012-07-23: qty 2

## 2012-07-23 MED ORDER — SODIUM CHLORIDE 0.9 % IJ SOLN
3.0000 mL | Freq: Two times a day (BID) | INTRAMUSCULAR | Status: DC
  Administered 2012-07-23 – 2012-07-24 (×2): 3 mL via INTRAVENOUS

## 2012-07-23 MED ORDER — PANTOPRAZOLE SODIUM 40 MG PO TBEC
40.0000 mg | DELAYED_RELEASE_TABLET | Freq: Every day | ORAL | Status: DC
  Administered 2012-07-24: 40 mg via ORAL
  Filled 2012-07-23: qty 1

## 2012-07-23 MED ORDER — INSULIN ASPART 100 UNIT/ML ~~LOC~~ SOLN: 0.0000 [IU] | Freq: Three times a day (TID) | SUBCUTANEOUS | Status: DC

## 2012-07-23 MED ORDER — FOLIC ACID 1 MG PO TABS
1.0000 mg | ORAL_TABLET | Freq: Every day | ORAL | Status: DC
  Administered 2012-07-23 – 2012-07-24 (×2): 1 mg via ORAL
  Filled 2012-07-23 (×2): qty 1

## 2012-07-23 MED ORDER — FUROSEMIDE 40 MG PO TABS
40.0000 mg | ORAL_TABLET | Freq: Every day | ORAL | Status: DC
  Administered 2012-07-23 – 2012-07-24 (×2): 40 mg via ORAL
  Filled 2012-07-23 (×2): qty 1

## 2012-07-23 MED ORDER — BISACODYL 5 MG PO TBEC
10.0000 mg | DELAYED_RELEASE_TABLET | Freq: Every day | ORAL | Status: DC | PRN
  Filled 2012-07-23: qty 2

## 2012-07-23 MED ORDER — SODIUM CHLORIDE 0.9 % IJ SOLN: 3.0000 mL | INTRAMUSCULAR | Status: DC | PRN

## 2012-07-23 MED ORDER — DIPHENHYDRAMINE HCL 25 MG PO CAPS: 25.0000 mg | ORAL_CAPSULE | Freq: Every evening | ORAL | Status: DC | PRN

## 2012-07-23 NOTE — Progress Notes (Signed)
Physical Therapy Treatment Patient Details Name: Susan Welch MRN: [id number] DOB: 09-28-1945 Today's Date: 07/23/2012 Time: 7829-5621 PT Time Calculation (min): 28 min  PT Assessment / Plan / Recommendation  PT Comments   Pt demonstrates some improvements in functional mobility today, less assist required for transfers and pt was able to increase ambulation to 40 ft. VCs for sternal precautions and educated patient on there-ex to perform for knee ROM.   Follow Up Recommendations  CIR           Equipment Recommendations   (TBD)    Recommendations for Other Services Rehab consult;OT consult  Frequency Min 3X/week   Progress towards PT Goals Progress towards PT goals: Progressing toward goals  Plan Current plan remains appropriate    Precautions / Restrictions Precautions Precautions: Knee;Sternal Precaution Comments: educated patient on sternal and knee precautions, will continue to enforce Restrictions Weight Bearing Restrictions: Yes RLE Weight Bearing: Weight bearing as tolerated Other Position/Activity Restrictions: Sternal precautions   Pertinent Vitals/Pain 6/10    Mobility  Bed Mobility Bed Mobility: Sitting - Scoot to Edge of Bed;Sit to Supine Supine to Sit: 4: Min assist;HOB elevated;With rails Sitting - Scoot to Edge of Bed: 4: Min assist Sit to Supine: Patient Percentage: 60% Transfers Transfers: Sit to Stand;Stand to Sit Sit to Stand: 1: +2 Total assist;From chair/3-in-1 Sit to Stand: Patient Percentage: 60% Stand to Sit: 1: +2 Total assist Stand to Sit: Patient Percentage: 70% Details for Transfer Assistance: VCs for technique using rocker moethod and compliance with sternal precaution Ambulation/Gait Ambulation/Gait Assistance: 4: Min assist Ambulation/Gait: Patient Percentage: 80% Ambulation Distance (Feet): 40 Feet Assistive device:  (pushing wc) Gait Pattern: Step-to pattern Gait velocity: decreased    Exercises  Knee flexion, AROM, AAROM, and  PROM Knee Extension AROM; 5x, 5 second holds Heel slides supine in bed, AAROM, 10 x     PT Goals (current goals can now be found in the care plan section) Acute Rehab PT Goals Patient Stated Goal: to get better PT Goal Formulation: With patient Time For Goal Achievement: 08/05/12 Potential to Achieve Goals: Fair  Visit Information  Last PT Received On: 07/23/12 Assistance Needed: +2 History of Present Illness: Pt was scheduled TKA. Pt with introperative arrest, cardiac cath, and now is s/p CABGx4.     Subjective Data  Patient Stated Goal: to get better   Cognition  Cognition Arousal/Alertness: Awake/alert Behavior During Therapy: WFL for tasks assessed/performed Overall Cognitive Status: Within Functional Limits for tasks assessed    Balance  Balance Balance Assessed: Static standing with stand by assist  End of Session PT - End of Session Equipment Utilized During Treatment: Gait belt;Oxygen Activity Tolerance: Patient limited by fatigue;Patient limited by pain Patient left: in bed;with call bell/phone within reach;with nursing/sitter in room Nurse Communication: Mobility status;Precautions;Weight bearing status CPM Right Knee Additional Comments: tolo. well   GP     Fabio Asa 07/23/2012, 2:33 PM Charlotte Crumb, PT DPT  [phone]

## 2012-07-23 NOTE — Progress Notes (Signed)
Rehab Admissions Coordinator Note:  Patient was screened by Susan Welch for appropriateness for an Inpatient Acute Rehab Consult.  At this time, we are recommending Inpatient Rehab consult.  Susan Welch 07/23/2012, 7:36 AM  I can be reached at [phone]

## 2012-07-23 NOTE — Progress Notes (Signed)
Patient ID: Susan Welch, female   DOB: [birth year], 67 y.o.   MRN: [id number] TCTS DAILY ICU PROGRESS NOTE                   [address]            [name] [address]          [phone]   2 Days Post-Op Procedure(s) (LRB): CORONARY ARTERY BYPASS GRAFTING (CABG) (N/A) INTRAOPERATIVE TRANSESOPHAGEAL ECHOCARDIOGRAM (N/A)  Total Length of Stay:  LOS: 8 days   Subjective: Stable night, up to chair neuro intact  Objective: Vital signs in last 24 hours: Temp:  [97.9 F (36.6 C)-99.3 F (37.4 C)] 98.2 F (36.8 C) (07/17 0709) Pulse Rate:  [49-106] 49 (07/17 0600) Cardiac Rhythm:  [-] Sinus tachycardia (07/17 0735) Resp:  [16-31] 18 (07/17 0700) BP: (108-137)/(55-79) 137/79 mmHg (07/17 0700) SpO2:  [84 %-100 %] 84 % (07/17 0600) Arterial Line BP: (140-145)/(45-53) 145/53 mmHg (07/16 1000) Weight:  [167 lb 15.9 oz (76.2 kg)] 167 lb 15.9 oz (76.2 kg) (07/17 0513)  Filed Weights   07/21/12 0440 07/22/12 0500 07/23/12 0513  Weight: 154 lb 1.6 oz (69.9 kg) 169 lb 15.6 oz (77.1 kg) 167 lb 15.9 oz (76.2 kg)    Weight change: -1 lb 15.7 oz (-0.9 kg)   Hemodynamic parameters for last 24 hours: PAP: (32-34)/(12-17) 34/17 mmHg  Intake/Output from previous day: 07/16 0701 - 07/17 0700 In: 1472.9 [P.O.:720; I.V.:652.9; IV Piggyback:100] Out: 1810 [Urine:1610; Drains:10; Chest Tube:190]  Intake/Output this shift:    Current Meds: Scheduled Meds: . acetaminophen  1,000 mg Oral Q6H   Or  . acetaminophen (TYLENOL) oral liquid 160 mg/5 mL  975 mg Per Tube Q6H  . aspirin EC  325 mg Oral Daily   Or  . aspirin  324 mg Per Tube Daily  . bisacodyl  10 mg Oral Daily   Or  . bisacodyl  10 mg Rectal Daily  . cefUROXime (ZINACEF)  IV  1.5 g Intravenous Q12H  . docusate sodium  200 mg Oral Daily  . enoxaparin  30 mg Subcutaneous Q24H  . insulin aspart  0-24 Units Subcutaneous Q4H  . magnesium sulfate  4 g Intravenous Once  . metoprolol tartrate  12.5 mg Oral BID   Or   . metoprolol tartrate  12.5 mg Per Tube BID  . pantoprazole  40 mg Oral Daily  . rosuvastatin  5 mg Oral q1800  . sodium chloride  3 mL Intravenous Q12H   Continuous Infusions: . sodium chloride 20 mL/hr at 07/23/12 0600  . sodium chloride    . sodium chloride 250 mL (07/22/12 0508)  . dexmedetomidine Stopped (07/22/12 0100)  . lactated ringers Stopped (07/22/12 1100)  . nitroGLYCERIN    . phenylephrine (NEO-SYNEPHRINE) Adult infusion Stopped (07/22/12 0600)   PRN Meds:.metoprolol, midazolam, morphine injection, ondansetron (ZOFRAN) IV, oxyCODONE, sodium chloride  General appearance: alert and cooperative Neurologic: intact Heart: regular rate and rhythm, S1, S2 normal, no murmur, click, rub or gallop Lungs: diminished breath sounds bibasilar Abdomen: soft, non-tender; bowel sounds normal; no masses,  no organomegaly Extremities: rt knee incision stable, brusing left leg from evh Wound: sturnum intact  Lab Results: CBC: Recent Labs  07/22/12 1700 07/22/12 1710 07/23/12 0555  WBC 11.2*  --  16.6*  HGB 7.7* 8.5* 9.1*  HCT 21.9* 25.0* 26.6*  PLT 185  --  206   BMET:  Recent Labs  07/22/12 0500  07/22/12 1710 07/23/12 0555  NA 138  --  137 135  K 4.1  --  4.0 4.3  CL 106  --  99 100  CO2 24  --   --  23  GLUCOSE 105*  --  134* 109*  BUN 12  --  13 19  CREATININE 0.60  < > 0.80 0.58  CALCIUM 7.3*  --   --  8.3*  < > = values in this interval not displayed.  PT/INR:  Recent Labs  07/21/12 1449  LABPROT 18.9*  INR 1.63*   Radiology: Dg Chest Portable 1 View In Am  07/22/2012   *RADIOLOGY REPORT*  Clinical Data: Postop CABG, follow-up  PORTABLE CHEST - 1 VIEW  Comparison: Portable chest x-ray of 07/21/2012  Findings: The endotracheal tube appears to have been removed. Aeration has diminished slightly.  There is cardiomegaly present with basilar atelectasis and probable mild pulmonary vascular congestion remaining.  Left chest tube is present and no pneumothorax is  seen.  The Swan-Ganz catheter tip is in the pulmonary outflow tract region.  IMPRESSION: Endotracheal tube removed with decrease in aeration and increase in basilar atelectasis.  Stable mild congestion.   Original Report Authenticated By: Dwyane Dee, M.D.   Dg Chest Portable 1 View  07/21/2012   *RADIOLOGY REPORT*  Clinical Data: Status post CABG.  PORTABLE CHEST - 1 VIEW  Comparison: Chest x-ray 07/19/2012.  Findings: An endotracheal tube is in place with tip 4.7 cm above the carina. Nasogastric tube extends into the mid stomach.  Right internal jugular central venous Cordis through which a Swan Ganz catheter has been passed into the distal pulmonic trunk.  Status post median sternotomy for CABG.  Left-sided chest tube in position with tip directed into the left apex.  Lung volumes are slightly low with bibasilar opacities most compatible with mild postoperative subsegmental atelectasis.  Small left pleural effusion.  No pneumothorax.  No evidence of pulmonary edema. Cardiomediastinal silhouette is within normal limits. Epicardial pacing wires are noted.  IMPRESSION: 1.  Postoperative changes and support apparatus, as above. 2.  Low lung volumes with mild bibasilar (left greater than right) subsegmental atelectasis and small left pleural effusion.   Original Report Authenticated By: Trudie Reed, M.D.     Assessment/Plan: S/P Procedure(s) (LRB): CORONARY ARTERY BYPASS GRAFTING (CABG) (N/A) INTRAOPERATIVE TRANSESOPHAGEAL ECHOCARDIOGRAM (N/A) Mobilize Diuresis d/c tubes/lines Plan for transfer to step-down: see transfer orders rehab consult     Ryer Asato B 07/23/2012 8:19 AM

## 2012-07-23 NOTE — Progress Notes (Signed)
Anesthesiology Follow-up:  Awake and alert sitting up in bed feels OK some low back pain, mild chest soreness.  VS: T-37.1 BP 134/76 HR 89 (SR) RR 20 O2 Sat 98% on RA  K-4.3 BUN/Cr. 19/0.58 glucose 109 H/H 9.1/26.6 Plts 206,000  Extubated 3 1/2 hours post-op following CABG on 7/15. Plan to transfer to 2000 today with eventual transfer to rehab.  Kipp Brood, MD

## 2012-07-23 NOTE — Consult Note (Signed)
Physical Medicine and Rehabilitation Consult Reason for Consult: Right total knee replacement with V. fib arrest status post CABG Referring Physician: Dr. Tyrone Sage   HPI: Susan Welch is a 67 y.o. right-handed female with history of hypertension. Patient independent prior to admission working full time. Admitted 07/14/2012 with end-stage degenerative changes right knee and underwent total knee replacement 07/15/2012 per Dr. Eulah Pont. Patient with intraoperative cardiac arrest and was resuscitated with cardiology services consulted. EKG showing inferior wall myocardial infarction. Cardiac catheterization showed severe three-vessel coronary artery disease with total occlusion of right coronary artery. Underwent CABG x4  07/21/2012 per Dr. Tyrone Sage. Patient maintained on subcutaneous Lovenox for DVT prophylaxis. Hospital course anemia 7.7 and monitored. Physical therapy evaluation completed 07/22/2012 patient is weightbearing as tolerated right lower extremity after total knee replacement with sternal precautions. Recommendations have been made for physical medicine rehabilitation consult to consider inpatient rehabilitation services   Review of Systems  Musculoskeletal: Positive for myalgias.  Neurological: Positive for headaches.  Psychiatric/Behavioral:       Anxiety/panic attacks  All other systems reviewed and are negative.   Past Medical History  Diagnosis Date  . Arthritis     a. bilat knees s/p L TKA 07/15/2012  . Anemia   . Hypertension   . Anxiety     Hx: of situational anxiety  . Headache(784.0)     Hx: of migraines  . Hyperlipidemia   . Panic attacks   . Ventricular fibrillation     a. 07/15/2012 in setting of R TKA   Past Surgical History  Procedure Laterality Date  . Abdominal hysterectomy    . Dilation and curettage of uterus    . Total knee arthroplasty Right 07/15/2012    Procedure: TOTAL KNEE ARTHROPLASTY;  Surgeon: Loreta Ave, MD;  Location: [facility] OR;  Service:  Orthopedics;  Laterality: Right;  drapes pulled back to provide cpr, new drape applied, protocol followed   Family History  Problem Relation Age of Onset  . Thyroid disease Mother     has thyroid removed   Social History:  reports that she quit smoking about 12 years ago. She does not have any smokeless tobacco history on file. She reports that  drinks alcohol. She reports that she does not use illicit drugs. Allergies:  Allergies  Allergen Reactions  . Statins Other (See Comments)    Joint pain  . Nickel Rash   Medications Prior to Admission  Medication Sig Dispense Refill  . acetaminophen (TYLENOL) 500 MG tablet Take 1,000 mg by mouth daily.      . calcium citrate-vitamin D (CITRACAL+D) 315-200 MG-UNIT per tablet Take 1 tablet by mouth daily.      Marland Kitchen lisinopril-hydrochlorothiazide (PRINZIDE,ZESTORETIC) 20-25 MG per tablet Take 1 tablet by mouth daily.  90 tablet  3  . OVER THE COUNTER MEDICATION Take 500 mg by mouth daily.        Home: Home Living Family/patient expects to be discharged to:: Private residence Living Arrangements: Alone Available Help at Discharge: Family;Available PRN/intermittently Type of Home: Apartment Home Access: Level entry Home Layout: One level  Functional History:   Functional Status:  Mobility: Bed Mobility Bed Mobility: Sitting - Scoot to Edge of Bed;Sit to Supine Sitting - Scoot to Edge of Bed: 1: +2 Total assist Sitting - Scoot to Edge of Bed: Patient Percentage: 30% Sit to Supine: 1: +2 Total assist;HOB flat Sit to Supine: Patient Percentage: 0% Transfers Transfers: Sit to Stand;Stand to Sit Sit to Stand: 1: +2 Total assist;From chair/3-in-1 Sit  to Stand: Patient Percentage: 40% Stand to Sit: 1: +2 Total assist Stand to Sit: Patient Percentage: 70% Ambulation/Gait Ambulation/Gait Assistance: 1: +2 Total assist Ambulation/Gait: Patient Percentage: 80% Ambulation Distance (Feet): 6 Feet Assistive device:  (pushing wc) Ambulation/Gait  Assistance Details: patient with increased pain and diffiuclty adhering to sternal precautions, unable to bear increased weight through RLE seocndary to TKA. very minimal steps tolerated Gait Pattern: Step-to pattern Gait velocity: decreased    ADL:    Cognition: Cognition Overall Cognitive Status: Within Functional Limits for tasks assessed Orientation Level: Oriented X4 Cognition Arousal/Alertness: Awake/alert Behavior During Therapy: WFL for tasks assessed/performed Overall Cognitive Status: Within Functional Limits for tasks assessed  Blood pressure 137/79, pulse 49, temperature 98.2 F (36.8 C), temperature source Oral, resp. rate 18, height 5\' 2"  (1.575 m), weight 76.2 kg (167 lb 15.9 oz), SpO2 84.00%. Physical Exam  Vitals reviewed. Constitutional: She is oriented to person, place, and time.  HENT:  Head: Normocephalic.  Eyes: EOM are normal.  Neck: Normal range of motion. Neck supple. No thyromegaly present.  Cardiovascular:  Cardiac rate control  Pulmonary/Chest: Breath sounds normal. No respiratory distress.  Abdominal: Soft. Bowel sounds are normal. She exhibits no distension.  Neurological: She is alert and oriented to person, place, and time.  Follows full commands  Skin:  Midline chest incision healing. Right knee incision dressed  Psychiatric: She has a normal mood and affect.    Results for orders placed during the hospital encounter of 07/15/12 (from the past 24 hour(s))  GLUCOSE, CAPILLARY     Status: Abnormal   Collection Time    07/22/12 11:23 AM      Result Value Range   Glucose-Capillary 131 (*) 70 - 99 mg/dL  MAGNESIUM     Status: None   Collection Time    07/22/12  5:00 PM      Result Value Range   Magnesium 2.5  1.5 - 2.5 mg/dL  CBC     Status: Abnormal   Collection Time    07/22/12  5:00 PM      Result Value Range   WBC 11.2 (*) 4.0 - 10.5 K/uL   RBC 2.53 (*) 3.87 - 5.11 MIL/uL   Hemoglobin 7.7 (*) 12.0 - 15.0 g/dL   HCT 16.1 (*) 09.6 -  46.0 %   MCV 86.6  78.0 - 100.0 fL   MCH 30.4  26.0 - 34.0 pg   MCHC 35.2  30.0 - 36.0 g/dL   RDW 04.5  40.9 - 81.1 %   Platelets 185  150 - 400 K/uL  CREATININE, SERUM     Status: None   Collection Time    07/22/12  5:00 PM      Result Value Range   Creatinine, Ser 0.65  0.50 - 1.10 mg/dL   GFR calc non Af Amer >90  >90 mL/min   GFR calc Af Amer >90  >90 mL/min  GLUCOSE, CAPILLARY     Status: Abnormal   Collection Time    07/22/12  5:08 PM      Result Value Range   Glucose-Capillary 124 (*) 70 - 99 mg/dL  POCT I-STAT, CHEM 8     Status: Abnormal   Collection Time    07/22/12  5:10 PM      Result Value Range   Sodium 137  135 - 145 mEq/L   Potassium 4.0  3.5 - 5.1 mEq/L   Chloride 99  96 - 112 mEq/L   BUN 13  6 -  23 mg/dL   Creatinine, Ser 1.61  0.50 - 1.10 mg/dL   Glucose, Bld 096 (*) 70 - 99 mg/dL   Calcium, Ion 0.45 (*) 1.13 - 1.30 mmol/L   TCO2 25  0 - 100 mmol/L   Hemoglobin 8.5 (*) 12.0 - 15.0 g/dL   HCT 40.9 (*) 81.1 - 91.4 %  GLUCOSE, CAPILLARY     Status: Abnormal   Collection Time    07/22/12  7:52 PM      Result Value Range   Glucose-Capillary 104 (*) 70 - 99 mg/dL   Comment 1 Documented in Chart     Comment 2 Notify RN    GLUCOSE, CAPILLARY     Status: Abnormal   Collection Time    07/22/12 11:08 PM      Result Value Range   Glucose-Capillary 160 (*) 70 - 99 mg/dL   Comment 1 Documented in Chart     Comment 2 Notify RN    GLUCOSE, CAPILLARY     Status: Abnormal   Collection Time    07/23/12  5:11 AM      Result Value Range   Glucose-Capillary 101 (*) 70 - 99 mg/dL   Comment 1 Documented in Chart     Comment 2 Notify RN    BASIC METABOLIC PANEL     Status: Abnormal   Collection Time    07/23/12  5:55 AM      Result Value Range   Sodium 135  135 - 145 mEq/L   Potassium 4.3  3.5 - 5.1 mEq/L   Chloride 100  96 - 112 mEq/L   CO2 23  19 - 32 mEq/L   Glucose, Bld 109 (*) 70 - 99 mg/dL   BUN 19  6 - 23 mg/dL   Creatinine, Ser 7.82  0.50 - 1.10 mg/dL    Calcium 8.3 (*) 8.4 - 10.5 mg/dL   GFR calc non Af Amer >90  >90 mL/min   GFR calc Af Amer >90  >90 mL/min  CBC     Status: Abnormal   Collection Time    07/23/12  5:55 AM      Result Value Range   WBC 16.6 (*) 4.0 - 10.5 K/uL   RBC 3.05 (*) 3.87 - 5.11 MIL/uL   Hemoglobin 9.1 (*) 12.0 - 15.0 g/dL   HCT 95.6 (*) 21.3 - 08.6 %   MCV 87.2  78.0 - 100.0 fL   MCH 29.8  26.0 - 34.0 pg   MCHC 34.2  30.0 - 36.0 g/dL   RDW 57.8  46.9 - 62.9 %   Platelets 206  150 - 400 K/uL  GLUCOSE, CAPILLARY     Status: None   Collection Time    07/23/12  7:07 AM      Result Value Range   Glucose-Capillary 97  70 - 99 mg/dL   Dg Chest Portable 1 View In Am  07/22/2012   *RADIOLOGY REPORT*  Clinical Data: Postop CABG, follow-up  PORTABLE CHEST - 1 VIEW  Comparison: Portable chest x-ray of 07/21/2012  Findings: The endotracheal tube appears to have been removed. Aeration has diminished slightly.  There is cardiomegaly present with basilar atelectasis and probable mild pulmonary vascular congestion remaining.  Left chest tube is present and no pneumothorax is seen.  The Swan-Ganz catheter tip is in the pulmonary outflow tract region.  IMPRESSION: Endotracheal tube removed with decrease in aeration and increase in basilar atelectasis.  Stable mild congestion.   Original Report Authenticated By:  Dwyane Dee, M.D.   Dg Chest Portable 1 View  07/21/2012   *RADIOLOGY REPORT*  Clinical Data: Status post CABG.  PORTABLE CHEST - 1 VIEW  Comparison: Chest x-ray 07/19/2012.  Findings: An endotracheal tube is in place with tip 4.7 cm above the carina. Nasogastric tube extends into the mid stomach.  Right internal jugular central venous Cordis through which a Swan Ganz catheter has been passed into the distal pulmonic trunk.  Status post median sternotomy for CABG.  Left-sided chest tube in position with tip directed into the left apex.  Lung volumes are slightly low with bibasilar opacities most compatible with mild  postoperative subsegmental atelectasis.  Small left pleural effusion.  No pneumothorax.  No evidence of pulmonary edema. Cardiomediastinal silhouette is within normal limits. Epicardial pacing wires are noted.  IMPRESSION: 1.  Postoperative changes and support apparatus, as above. 2.  Low lung volumes with mild bibasilar (left greater than right) subsegmental atelectasis and small left pleural effusion.   Original Report Authenticated By: Trudie Reed, M.D.    Assessment/Plan: Diagnosis: right tka with intraoperative cardiac arrest 1. Does the need for close, 24 hr/day medical supervision in concert with the patient's rehab needs make it unreasonable for this patient to be served in a less intensive setting? Yes 2. Co-Morbidities requiring supervision/potential complications: htn 3. Due to bladder management, bowel management, safety, skin/wound care, disease management, medication administration, pain management and patient education, does the patient require 24 hr/day rehab nursing? Yes 4. Does the patient require coordinated care of a physician, rehab nurse, PT (1-2 hrs/day, 5 days/week) and OT (1-2 hrs/day, 5 days/week) to address physical and functional deficits in the context of the above medical diagnosis(es)? Yes Addressing deficits in the following areas: balance, endurance, locomotion, strength, transferring, bowel/bladder control, bathing, dressing, feeding, grooming, toileting and psychosocial support 5. Can the patient actively participate in an intensive therapy program of at least 3 hrs of therapy per day at least 5 days per week? Yes 6. The potential for patient to make measurable gains while on inpatient rehab is excellent 7. Anticipated functional outcomes upon discharge from inpatient rehab are mod I with PT, mod I to set up with OT, n/a with SLP. 8. Estimated rehab length of stay to reach the above functional goals is: 2-3 weeks 9. Does the patient have adequate social supports  to accommodate these discharge functional goals? Yes 10. Anticipated D/C setting: Home 11. Anticipated post D/C treatments: HH therapy 12. Overall Rehab/Functional Prognosis: excellent  RECOMMENDATIONS: This patient's condition is appropriate for continued rehabilitative care in the following setting: CIR Patient has agreed to participate in recommended program. Yes Note that insurance prior authorization may be required for reimbursement for recommended care.  Comment: Rehab RN to follow up.   Ranelle Oyster, MD, [place]     07/23/2012

## 2012-07-23 NOTE — Progress Notes (Signed)
Pt is potential CIR admit.  Discussed CIR program w/ her & she is agreeable.  Admission to CIR depends on bed availability, pt's medical stability & her insurance providing authorization for CIR.  Roderic Palau will f/up tomorrow.  Her ph# [phone]

## 2012-07-23 NOTE — Progress Notes (Signed)
Occupational Therapy Evaluation Patient Details Name: Susan Welch MRN: [id number] DOB: 1945-02-04 Today's Date: 07/23/2012 Time: 4098-1191 OT Time Calculation (min): 28 min  OT Assessment / Plan / Recommendation History of present illness Pt was scheduled TKA. Pt with introperative arrest, cardiac cath, and now is s/p CABGx4.    Clinical Impression   PTA. Pt lived independently at home. Pt will benefit from skilled OT services to facilitate D/C to CIR due to below deficits. Feel pt can reach mod I level to return home with intermittent S. Will follow acutely.    OT Assessment       Follow Up Recommendations  CIR    Barriers to Discharge      Equipment Recommendations  3 in 1 bedside comode;Tub/shower bench    Recommendations for Other Services Rehab consult  Frequency       Precautions / Restrictions Precautions Precautions: Knee;Sternal Precaution Comments: educated patient on sternal and knee precautions, will continue to enforce Restrictions Weight Bearing Restrictions: Yes RLE Weight Bearing: Weight bearing as tolerated Other Position/Activity Restrictions: Sternal precautions   Pertinent Vitals/Pain 6 knee. Repositioned. premedicated    ADL  Grooming: Set up Where Assessed - Grooming: Unsupported sitting Upper Body Bathing: Minimal assistance Where Assessed - Upper Body Bathing: Supported sitting Lower Body Bathing: Maximal assistance Where Assessed - Lower Body Bathing: Supported sit to stand Upper Body Dressing: Minimal assistance Where Assessed - Upper Body Dressing: Unsupported sitting Lower Body Dressing: Maximal assistance Where Assessed - Lower Body Dressing: Supported sit to Pharmacist, hospital: Moderate assistance Toilet Transfer Method: Sit to stand Equipment Used: Gait belt;Wheelchair Transfers/Ambulation Related to ADLs: mod A ADL Comments: limited with LB ADL  Encouraged pt to dangle knee and work on terminal knee extension.   OT  Diagnosis:    OT Problem List:   OT Treatment Interventions:     OT Goals(Current goals can be found in the care plan section) Acute Rehab OT Goals Patient Stated Goal: to get better OT Goal Formulation: With patient Time For Goal Achievement: 08/06/12 Potential to Achieve Goals: Good  Visit Information  Last OT Received On: 07/23/12 Assistance Needed: +2 PT/OT Co-Evaluation/Treatment: Yes History of Present Illness: Pt was scheduled TKA. Pt with introperative arrest, cardiac cath, and now is s/p CABGx4.        Prior Functioning     Home Living Family/patient expects to be discharged to:: Private residence Living Arrangements: Alone Available Help at Discharge: Family;Available PRN/intermittently Type of Home: Apartment Home Access: Level entry Home Layout: One level Home Equipment: None Prior Function Level of Independence: Independent Communication Communication: HOH (has tinnitus) Dominant Hand: Right         Vision/Perception Vision - History Baseline Vision: Wears glasses all the time   Cognition  Cognition Arousal/Alertness: Awake/alert Behavior During Therapy: WFL for tasks assessed/performed Overall Cognitive Status: Within Functional Limits for tasks assessed    Extremity/Trunk Assessment Upper Extremity Assessment Upper Extremity Assessment: Generalized weakness Lower Extremity Assessment RLE Deficits / Details: R TKA, limited ROm RLE: Unable to fully assess due to pain Cervical / Trunk Assessment Cervical / Trunk Assessment: Normal     Mobility Bed Mobility Bed Mobility: Sitting - Scoot to Edge of Bed;Sit to Supine Supine to Sit: 4: Min assist;HOB elevated;With rails Sitting - Scoot to Edge of Bed: 1: +2 Total assist Sitting - Scoot to Edge of Bed: Patient Percentage: 30% Sit to Supine: 1: +2 Total assist;HOB flat Sit to Supine: Patient Percentage: 0% Transfers Transfers: Sit to Stand;Stand to  Sit Sit to Stand: 1: +2 Total assist;From  chair/3-in-1 Sit to Stand: Patient Percentage: 40% Stand to Sit: 1: +2 Total assist Stand to Sit: Patient Percentage: 70% Details for Transfer Assistance: VCs for technique using rocker moethod and compliance with sternal precaution     Exercise     Balance Balance Balance Assessed:  (WFL for ADL)   End of Session OT - End of Session Equipment Utilized During Treatment: Gait belt;Other (comment) (w/c) Activity Tolerance: Patient tolerated treatment well Patient left: in chair;with call bell/phone within reach Nurse Communication: Mobility status CPM Right Knee Additional Comments: tolo. well  GO     Shaydon Lease,HILLARY 07/23/2012, 2:29 PM [facility], OTR/L  [phone] 07/23/2012

## 2012-07-23 NOTE — Progress Notes (Signed)
Subjective: 2 Days Post-Op Procedure(s) (LRB): CORONARY ARTERY BYPASS GRAFTING (CABG) (N/A) INTRAOPERATIVE TRANSESOPHAGEAL ECHOCARDIOGRAM (N/A) Patient reports pain as 3 on 0-10 scale.    Objective: Vital signs in last 24 hours: Temp:  [97.9 F (36.6 C)-98.7 F (37.1 C)] 98.7 F (37.1 C) (07/17 1100) Pulse Rate:  [49-106] 89 (07/17 1300) Resp:  [16-31] 20 (07/17 1300) BP: (110-149)/(57-111) 134/76 mmHg (07/17 1300) SpO2:  [84 %-100 %] 100 % (07/17 1300) Weight:  [76.2 kg (167 lb 15.9 oz)] 76.2 kg (167 lb 15.9 oz) (07/17 0513)  Intake/Output from previous day: 07/16 0701 - 07/17 0700 In: 1472.9 [P.O.:720; I.V.:652.9; IV Piggyback:100] Out: 1810 [Urine:1610; Drains:10; Chest Tube:190] Intake/Output this shift: Total I/O In: 480 [P.O.:480] Out: 360 [Urine:305; Chest Tube:55]   Recent Labs  07/21/12 2024 07/22/12 0500 07/22/12 1700 07/22/12 1710 07/23/12 0555  HGB 9.0* 7.7* 7.7* 8.5* 9.1*    Recent Labs  07/22/12 1700 07/22/12 1710 07/23/12 0555  WBC 11.2*  --  16.6*  RBC 2.53*  --  3.05*  HCT 21.9* 25.0* 26.6*  PLT 185  --  206    Recent Labs  07/22/12 0500  07/22/12 1710 07/23/12 0555  NA 138  --  137 135  K 4.1  --  4.0 4.3  CL 106  --  99 100  CO2 24  --   --  23  BUN 12  --  13 19  CREATININE 0.60  < > 0.80 0.58  GLUCOSE 105*  --  134* 109*  CALCIUM 7.3*  --   --  8.3*  < > = values in this interval not displayed.  Recent Labs  07/21/12 1449  INR 1.63*    Sensation intact distally Incision: no drainage Compartment soft Staples show no irritation or signs of infection Has increased CPM to 60 degrees flexion  Assessment/Plan: 2 Days Post-Op Procedure(s) (LRB): CORONARY ARTERY BYPASS GRAFTING (CABG) (N/A) INTRAOPERATIVE TRANSESOPHAGEAL ECHOCARDIOGRAM (N/A) Up with therapy Continue CPM  [name],[name] 07/23/2012, 2:18 PM

## 2012-07-24 ENCOUNTER — Inpatient Hospital Stay (HOSPITAL_COMMUNITY)
Admission: RE | Admit: 2012-07-24 | Discharge: 2012-07-31 | DRG: 945 | Disposition: A | Discharge status: Home Health | Payer: Medicare Other | Source: Intra-hospital | Attending: Physical Medicine & Rehabilitation | Admitting: Physical Medicine & Rehabilitation

## 2012-07-24 ENCOUNTER — Encounter (HOSPITAL_COMMUNITY): Payer: Self-pay

## 2012-07-24 DIAGNOSIS — I2510 Atherosclerotic heart disease of native coronary artery without angina pectoris: Secondary | ICD-10-CM | POA: Diagnosis present

## 2012-07-24 DIAGNOSIS — E785 Hyperlipidemia, unspecified: Secondary | ICD-10-CM | POA: Diagnosis present

## 2012-07-24 DIAGNOSIS — D62 Acute posthemorrhagic anemia: Secondary | ICD-10-CM | POA: Diagnosis present

## 2012-07-24 DIAGNOSIS — I80299 Phlebitis and thrombophlebitis of other deep vessels of unspecified lower extremity: Secondary | ICD-10-CM

## 2012-07-24 DIAGNOSIS — I469 Cardiac arrest, cause unspecified: Secondary | ICD-10-CM | POA: Diagnosis present

## 2012-07-24 DIAGNOSIS — M1710 Unilateral primary osteoarthritis, unspecified knee: Secondary | ICD-10-CM | POA: Diagnosis present

## 2012-07-24 DIAGNOSIS — I10 Essential (primary) hypertension: Secondary | ICD-10-CM | POA: Diagnosis present

## 2012-07-24 DIAGNOSIS — R059 Cough, unspecified: Secondary | ICD-10-CM | POA: Diagnosis not present

## 2012-07-24 DIAGNOSIS — IMO0002 Reserved for concepts with insufficient information to code with codable children: Secondary | ICD-10-CM

## 2012-07-24 DIAGNOSIS — Z96659 Presence of unspecified artificial knee joint: Secondary | ICD-10-CM

## 2012-07-24 DIAGNOSIS — Z87891 Personal history of nicotine dependence: Secondary | ICD-10-CM

## 2012-07-24 DIAGNOSIS — Z951 Presence of aortocoronary bypass graft: Secondary | ICD-10-CM

## 2012-07-24 DIAGNOSIS — F411 Generalized anxiety disorder: Secondary | ICD-10-CM | POA: Diagnosis present

## 2012-07-24 DIAGNOSIS — R05 Cough: Secondary | ICD-10-CM | POA: Diagnosis not present

## 2012-07-24 DIAGNOSIS — E8779 Other fluid overload: Secondary | ICD-10-CM | POA: Diagnosis present

## 2012-07-24 DIAGNOSIS — Z5189 Encounter for other specified aftercare: Secondary | ICD-10-CM | POA: Diagnosis present

## 2012-07-24 DIAGNOSIS — I824Z9 Acute embolism and thrombosis of unspecified deep veins of unspecified distal lower extremity: Secondary | ICD-10-CM | POA: Diagnosis present

## 2012-07-24 LAB — TYPE AND SCREEN
ABO/RH(D): A POS
Antibody Screen: NEGATIVE
Unit division: 0
Unit division: 0
Unit division: 0
Unit division: 0
Unit division: 0
Unit division: 0

## 2012-07-24 LAB — BASIC METABOLIC PANEL
BUN: 20 mg/dL (ref 6–23)
CO2: 29 mEq/L (ref 19–32)
Calcium: 8 mg/dL — ABNORMAL LOW (ref 8.4–10.5)
Chloride: 100 mEq/L (ref 96–112)
Creatinine, Ser: 0.55 mg/dL (ref 0.50–1.10)
GFR calc Af Amer: >90 mL/min (ref >90)
GFR calc non Af Amer: >90 mL/min (ref >90)
Glucose, Bld: 102 mg/dL — ABNORMAL HIGH (ref 70–99)
Potassium: 4 mEq/L (ref 3.5–5.1)
Sodium: 136 mEq/L (ref 135–145)

## 2012-07-24 LAB — CBC
HCT: 23.6 % — ABNORMAL LOW (ref 36.0–46.0)
Hemoglobin: 8 g/dL — ABNORMAL LOW (ref 12.0–15.0)
MCH: 30.2 pg (ref 26.0–34.0)
MCHC: 33.9 g/dL (ref 30.0–36.0)
MCV: 89.1 fL (ref 78.0–100.0)
Platelets: 265 10*3/uL (ref 150–400)
RBC: 2.65 MIL/uL — ABNORMAL LOW (ref 3.87–5.11)
RDW: 14.9 % (ref 11.5–15.5)
WBC: 14.1 10*3/uL — ABNORMAL HIGH (ref 4.0–10.5)

## 2012-07-24 LAB — GLUCOSE, CAPILLARY
Glucose-Capillary: 100 mg/dL — ABNORMAL HIGH (ref 70–99)
Glucose-Capillary: 120 mg/dL — ABNORMAL HIGH (ref 70–99)

## 2012-07-24 MED ORDER — METOPROLOL TARTRATE 25 MG PO TABS
25.0000 mg | ORAL_TABLET | Freq: Two times a day (BID) | ORAL | Status: DC
  Administered 2012-07-24: 25 mg via ORAL
  Filled 2012-07-24 (×2): qty 1

## 2012-07-24 MED ORDER — ASPIRIN 325 MG PO TBEC: 325.0000 mg | DELAYED_RELEASE_TABLET | Freq: Every day | ORAL | Status: DC

## 2012-07-24 MED ORDER — FUROSEMIDE 40 MG PO TABS
40.0000 mg | ORAL_TABLET | Freq: Every day | ORAL | Status: AC
  Administered 2012-07-25 – 2012-07-27 (×3): 40 mg via ORAL
  Filled 2012-07-24 (×3): qty 1

## 2012-07-24 MED ORDER — ACETAMINOPHEN 325 MG PO TABS: 325.0000 mg | ORAL_TABLET | ORAL | Status: DC | PRN

## 2012-07-24 MED ORDER — POLYSACCHARIDE IRON COMPLEX 150 MG PO CAPS
150.0000 mg | ORAL_CAPSULE | Freq: Every day | ORAL | Status: DC
  Administered 2012-07-24: 150 mg via ORAL
  Filled 2012-07-24: qty 1

## 2012-07-24 MED ORDER — POLYSACCHARIDE IRON COMPLEX 150 MG PO CAPS: 150.0000 mg | ORAL_CAPSULE | Freq: Every day | ORAL | Status: DC

## 2012-07-24 MED ORDER — FUROSEMIDE 40 MG PO TABS: 40.0000 mg | ORAL_TABLET | Freq: Every day | ORAL | Status: DC

## 2012-07-24 MED ORDER — SORBITOL 70 % SOLN
30.0000 mL | Freq: Every day | Status: DC | PRN
  Filled 2012-07-24: qty 30

## 2012-07-24 MED ORDER — POTASSIUM CHLORIDE CRYS ER 20 MEQ PO TBCR
20.0000 meq | EXTENDED_RELEASE_TABLET | Freq: Every day | ORAL | Status: AC
  Administered 2012-07-25 – 2012-07-27 (×3): 20 meq via ORAL
  Filled 2012-07-24 (×3): qty 1

## 2012-07-24 MED ORDER — ENOXAPARIN SODIUM 30 MG/0.3ML ~~LOC~~ SOLN: 30.0000 mg | SUBCUTANEOUS | Status: DC

## 2012-07-24 MED ORDER — PANTOPRAZOLE SODIUM 40 MG PO TBEC
40.0000 mg | DELAYED_RELEASE_TABLET | Freq: Every day | ORAL | Status: DC
  Administered 2012-07-25 – 2012-07-31 (×7): 40 mg via ORAL
  Filled 2012-07-24 (×7): qty 1

## 2012-07-24 MED ORDER — SENNOSIDES-DOCUSATE SODIUM 8.6-50 MG PO TABS
1.0000 | ORAL_TABLET | Freq: Every evening | ORAL | Status: DC | PRN
  Administered 2012-07-26 – 2012-07-28 (×2): 1 via ORAL
  Filled 2012-07-24 (×3): qty 1

## 2012-07-24 MED ORDER — ENOXAPARIN SODIUM 30 MG/0.3ML ~~LOC~~ SOLN
30.0000 mg | SUBCUTANEOUS | Status: DC
  Administered 2012-07-24: 30 mg via SUBCUTANEOUS
  Filled 2012-07-24 (×3): qty 0.3

## 2012-07-24 MED ORDER — FOLIC ACID 1 MG PO TABS: 1.0000 mg | ORAL_TABLET | Freq: Every day | ORAL | Status: DC

## 2012-07-24 MED ORDER — METOPROLOL TARTRATE 25 MG PO TABS
25.0000 mg | ORAL_TABLET | Freq: Two times a day (BID) | ORAL | Status: DC
Start: 2012-07-24 — End: 2012-07-31
  Administered 2012-07-24 – 2012-07-31 (×14): 25 mg via ORAL
  Filled 2012-07-24 (×17): qty 1

## 2012-07-24 MED ORDER — LISINOPRIL 2.5 MG PO TABS: 2.5000 mg | ORAL_TABLET | Freq: Every day | ORAL | Status: DC

## 2012-07-24 MED ORDER — OXYCODONE HCL 5 MG PO TABS: 5.0000 mg | ORAL_TABLET | ORAL | Status: DC | PRN

## 2012-07-24 MED ORDER — OXYCODONE HCL 5 MG PO TABS
5.0000 mg | ORAL_TABLET | ORAL | Status: DC | PRN
  Administered 2012-07-24 – 2012-07-31 (×23): 10 mg via ORAL
  Filled 2012-07-24 (×23): qty 2

## 2012-07-24 MED ORDER — ONDANSETRON HCL 4 MG PO TABS: 4.0000 mg | ORAL_TABLET | Freq: Four times a day (QID) | ORAL | Status: DC | PRN

## 2012-07-24 MED ORDER — POTASSIUM CHLORIDE CRYS ER 20 MEQ PO TBCR: 20.0000 meq | EXTENDED_RELEASE_TABLET | Freq: Every day | ORAL | Status: DC

## 2012-07-24 MED ORDER — PANTOPRAZOLE SODIUM 40 MG PO TBEC: 40.0000 mg | DELAYED_RELEASE_TABLET | Freq: Every day | ORAL | Status: DC

## 2012-07-24 MED ORDER — FOLIC ACID 1 MG PO TABS
1.0000 mg | ORAL_TABLET | Freq: Every day | ORAL | Status: DC
  Administered 2012-07-25 – 2012-07-31 (×7): 1 mg via ORAL
  Filled 2012-07-24 (×8): qty 1

## 2012-07-24 MED ORDER — ASPIRIN EC 325 MG PO TBEC
325.0000 mg | DELAYED_RELEASE_TABLET | Freq: Every day | ORAL | Status: DC
  Administered 2012-07-25 – 2012-07-31 (×7): 325 mg via ORAL
  Filled 2012-07-24 (×9): qty 1

## 2012-07-24 MED ORDER — METOPROLOL TARTRATE 25 MG PO TABS: 25.0000 mg | ORAL_TABLET | Freq: Two times a day (BID) | ORAL | Status: DC

## 2012-07-24 MED ORDER — ROSUVASTATIN CALCIUM 5 MG PO TABS: 5.0000 mg | ORAL_TABLET | Freq: Every day | ORAL | Status: DC

## 2012-07-24 MED ORDER — LISINOPRIL 2.5 MG PO TABS
2.5000 mg | ORAL_TABLET | Freq: Every day | ORAL | Status: DC
  Administered 2012-07-25 – 2012-07-31 (×7): 2.5 mg via ORAL
  Filled 2012-07-24 (×8): qty 1

## 2012-07-24 MED ORDER — DSS 100 MG PO CAPS: 200.0000 mg | ORAL_CAPSULE | Freq: Every day | ORAL | Status: DC

## 2012-07-24 MED ORDER — POLYSACCHARIDE IRON COMPLEX 150 MG PO CAPS
150.0000 mg | ORAL_CAPSULE | Freq: Every day | ORAL | Status: DC
  Administered 2012-07-25 – 2012-07-31 (×7): 150 mg via ORAL
  Filled 2012-07-24 (×8): qty 1

## 2012-07-24 MED ORDER — ONDANSETRON HCL 4 MG/2ML IJ SOLN: 4.0000 mg | Freq: Four times a day (QID) | INTRAMUSCULAR | Status: DC | PRN

## 2012-07-24 MED ORDER — ROSUVASTATIN CALCIUM 5 MG PO TABS
5.0000 mg | ORAL_TABLET | Freq: Every day | ORAL | Status: DC
  Administered 2012-07-25 – 2012-07-30 (×6): 5 mg via ORAL
  Filled 2012-07-24 (×7): qty 1

## 2012-07-24 NOTE — Discharge Summary (Signed)
[address]       Susan Welch [address]             [phone]              Discharge Summary  Name: Susan Welch DOB: [birth year] 67 y.o. MRN: [id number]   Admission Date: 07/15/2012 Discharge Date: 07/24/2012     Admitting Diagnosis: Right knee end-stage degenerative arthritis   Discharge Diagnosis:  Right knee end-stage degenerative arthritis Intraoperative ventricular fibrillation cardiac arrest Non-ST elevation myocardial infarction Severe 3 vessel coronary artery disease Expected postoperative blood loss anemia  Past Medical History  Diagnosis Date  . Arthritis     a. bilat knees s/p L TKA 07/15/2012  . Anemia   . Hypertension   . Anxiety     Hx: of situational anxiety  . Headache(784.0)     Hx: of migraines  . Hyperlipidemia   . Panic attacks   . Ventricular fibrillation     a. 07/15/2012 in setting of R TKA      Procedures: MODIFIED MINIMALLY INVASIVE RIGHT TOTAL KNEE REPLACEMENT - 07/15/2012  Katr[employer] OXINIUM prosthesis)  CORONARY ARTERY BYPASS GRAFTING x 4- 07/21/2012 (Left internal mammary artery to left anterior descending, sequential saphenous vein graft to ramus intermediate and first obtuse marginal, saphenous vein graft to distal right coronary artery)  Endoscopic vein harvest left leg      HPI:  The patient is a 67 y.o. female patient followed by Dr. Eulah Pont with a history of bilateral end stage degenerative joint disease of the knee.  She has had increasing symptoms in her right leg, including rest pain, pain at night interfering with sleep, and worsening right knee deformity.  She has failed conservative treatment modalities including rest, anti-inflammatories, and steroid injections.   X-rays show end stage degenerative joint disease, medial compartment, with calcific loose bodies in the suprapatellar pouch of the right knee. It was felt that she would benefit from right knee replacement at this time.  All risks,  benefits, and alternatives of surgery were explained in detail to the patient, and she agreed to proceed.    Hospital Course:  The patient was admitted to [facility] on 07/15/2012 and was taken to the operating room for her planned right knee replacement. Intraoperatively, the patient developed a sudden onset V. Fib arrest approximately 25 minutes after induction of general anesthesia. Chest compressions were begun promptly and the patient was subsequently cardioverted to sinus rhythm with a single countershock of 120 J. She was somewhat bradycardic initially, for which she was treated with atropine, but during the remainder of the procedure, she remained hemodynamically stable. Intraoperative transesophageal echocardiogram demonstrated akinesis of the distal inferior wall and interventricular septum. The patient remained stable throughout the remainder of her total knee replacement and was taken to the CCU for further recovery and workup.    She was seen in consultation by [facility]. She ruled in for an acute non-ST elevation myocardial infarction and serial EKGs demonstrated findings consistent with an inferior wall myocardial infarction. She had no recurrent chest pain or arrhythmias postoperatively. She underwent cardiac catheterization by Dr. Antoine Poche, and was found to have critical three-vessel coronary artery disease with chronic occlusion of the right coronary artery and high-grade ostial stenosis of the left anterior descending coronary artery. Cardiothoracic surgical consultation was requested for consideration of surgical revascularization.  Dr. Cornelius Moras saw the patient and reviewed her films, and felt that she would require CABG during this  admission. She agreed to proceed, but requested Dr. Tyrone Sage to perform her surgery.  He saw the patient and reviewed her films as well, and agreed with Dr. Orvan July assessment.  The goals, risks, and alternatives of the procedure were discussed with the  patient in detail, and she agreed to proceed.  She remained stable, and was taken to the operating room on 07/21/2012, where she underwent coronary artery bypass grafting.  She tolerated the procedure well, and was taken to the SICU for postoperative management.  The postoperative course has been generally uneventful.  She was started on aspirin, a beta blocker, ACE-I and statin for her recent MI. She was also started on Lasix for a mild postoperative volume overload, and is diuresing well.  PT was initiated for mobility per orthopedics' recommendation.  Her incisions are healing well.  She has remained in sinus rhythm, and vital signs are stable. There has been a mild postoperative blood loss anemia, which has not required transfusion.  She was started on supplemental iron. She is progressing well from both a cardiac and orthopedic standpoint. She has been evaluated by [facility] for admission, and is currently medically stable for transfer once a bed is available.     Recent vital signs:  Filed Vitals:   07/24/12 0940  BP: 155/77  Pulse:   Temp:   Resp:     Recent laboratory studies:  CBC: Recent Labs  07/23/12 0555 07/24/12 0400  WBC 16.6* 14.1*  HGB 9.1* 8.0*  HCT 26.6* 23.6*  PLT 206 265   BMET:  Recent Labs  07/23/12 0555 07/24/12 0400  NA 135 136  K 4.3 4.0  CL 100 100  CO2 23 29  GLUCOSE 109* 102*  BUN 19 20  CREATININE 0.58 0.55  CALCIUM 8.3* 8.0*    PT/INR:  Recent Labs  07/21/12 1449  LABPROT 18.9*  INR 1.63*     Discharge Medications:     Medication List    STOP taking these medications       acetaminophen 500 MG tablet  Commonly known as:  TYLENOL     lisinopril-hydrochlorothiazide 20-25 MG per tablet  Commonly known as:  PRINZIDE,ZESTORETIC     OVER THE COUNTER MEDICATION      TAKE these medications       aspirin 325 MG EC tablet  Take 1 tablet (325 mg total) by mouth daily.     calcium citrate-vitamin D 315-200 MG-UNIT  per tablet  Commonly known as:  CITRACAL+D  Take 1 tablet by mouth daily.     DSS 100 MG Caps  Take 200 mg by mouth daily.     enoxaparin 30 MG/0.3ML injection  Commonly known as:  LOVENOX  Inject 0.3 mLs (30 mg total) into the skin daily.     folic acid 1 MG tablet  Commonly known as:  FOLVITE  Take 1 tablet (1 mg total) by mouth daily.     furosemide 40 MG tablet  Commonly known as:  LASIX  Take 1 tablet (40 mg total) by mouth daily. X 3 doses     iron polysaccharides 150 MG capsule  Commonly known as:  NIFEREX  Take 1 capsule (150 mg total) by mouth daily.     lisinopril 2.5 MG tablet  Commonly known as:  PRINIVIL,ZESTRIL  Take 1 tablet (2.5 mg total) by mouth daily.     metoprolol tartrate 25 MG tablet  Commonly known as:  LOPRESSOR  Take 1 tablet (25 mg total) by  mouth 2 (two) times daily.     oxyCODONE 5 MG immediate release tablet  Commonly known as:  Oxy IR/ROXICODONE  Take 1-2 tablets (5-10 mg total) by mouth every 3 (three) hours as needed for pain.     pantoprazole 40 MG tablet  Commonly known as:  PROTONIX  Take 1 tablet (40 mg total) by mouth daily before breakfast.     potassium chloride SA 20 MEQ tablet  Commonly known as:  K-DUR,KLOR-CON  Take 1 tablet (20 mEq total) by mouth daily. X 3 doses     rosuvastatin 5 MG tablet  Commonly known as:  CRESTOR  Take 1 tablet (5 mg total) by mouth daily at 6 PM.         Discharge Instructions:  The patient is to refrain from driving, heavy lifting or strenuous activity.  May shower daily and clean incisions with soap and water.  May resume regular diet.     Follow Up:    Follow-up Information   Schedule an appointment as soon as possible for a visit with Rollene Rotunda, MD. (2 weeks post-discharge)    Contact information:   [address] [place] [address] [phone]       Schedule an appointment as soon as possible for a visit with GERHARDT,EDWARD B, MD.  (Follow up 2-3 weeks post-discharge with a chest x-ray)    Contact information:   [address] [place] [address] [phone]       Schedule an appointment as soon as possible for a visit with Loreta Ave, MD. (Follow up as directed)    Contact information:   [address] [place] [address] [phone]        Noora Locascio H 07/24/2012, 11:14 AM

## 2012-07-24 NOTE — Interval H&P Note (Signed)
Shresta Risden was admitted today to Inpatient Rehabilitation with the diagnosis of right TKA with intra-operative cardiac arrest/ CABG.  The patient's history has been reviewed, patient examined, and there is no change in status.  Patient continues to be appropriate for intensive inpatient rehabilitation.  I have reviewed the patient's chart and labs.  Questions were answered to the patient's satisfaction.  SWARTZ,ZACHARY T 07/24/2012, 7:15 PM

## 2012-07-24 NOTE — Progress Notes (Signed)
CT sutures removed and benzoin applied and 1/2" steri strips.  Pt tolerated the procedure well without any complications.  Removed three epicardial wires intact and pt tolerated procedure well.  Pt to remain on bedrest for one hour.  Frequent vital signs taken and documented.  No ectopy.  Pt resting with call bell within reach.  Will continue to monitor. Thomas Hoff

## 2012-07-24 NOTE — Progress Notes (Signed)
Pt. arrived to room 4W02 from 2000. Alert and oriented x4, denies pain at present. Oriented to room and call light. See FS for full assessment. VSS. Report to eve shift.

## 2012-07-24 NOTE — Progress Notes (Signed)
Subjective: 3 Days Post-Op Procedure(s) (LRB): CORONARY ARTERY BYPASS GRAFTING (CABG) (N/A) INTRAOPERATIVE TRANSESOPHAGEAL ECHOCARDIOGRAM (N/A) Patient reports pain as 2 on 0-10 scale.    Objective: Vital signs in last 24 hours: Temp:  [98.2 F (36.8 C)-98.7 F (37.1 C)] 98.2 F (36.8 C) (07/18 1313) Pulse Rate:  [74-103] 89 (07/18 1313) Resp:  [18-24] 18 (07/18 1313) BP: (115-155)/(61-94) 115/61 mmHg (07/18 1313) SpO2:  [97 %-100 %] 99 % (07/18 1313) Weight:  [75.388 kg (166 lb 3.2 oz)] 75.388 kg (166 lb 3.2 oz) (07/18 0452)  Intake/Output from previous day: 07/17 0701 - 07/18 0700 In: 720 [P.O.:720] Out: 1060 [Urine:1005; Chest Tube:55] Intake/Output this shift:     Recent Labs  07/22/12 0500 07/22/12 1700 07/22/12 1710 07/23/12 0555 07/24/12 0400  HGB 7.7* 7.7* 8.5* 9.1* 8.0*    Recent Labs  07/23/12 0555 07/24/12 0400  WBC 16.6* 14.1*  RBC 3.05* 2.65*  HCT 26.6* 23.6*  PLT 206 265    Recent Labs  07/23/12 0555 07/24/12 0400  NA 135 136  K 4.3 4.0  CL 100 100  CO2 23 29  BUN 19 20  CREATININE 0.58 0.55  GLUCOSE 109* 102*  CALCIUM 8.3* 8.0*    Recent Labs  07/21/12 1449  INR 1.63*    Neurovascular intact Dorsiflexion/Plantar flexion intact Incision: no drainage No cellulitis present Compartment soft  Assessment/Plan: 3 Days Post-Op Procedure(s) (LRB): CORONARY ARTERY BYPASS GRAFTING (CABG) (N/A) INTRAOPERATIVE TRANSESOPHAGEAL ECHOCARDIOGRAM (N/A) Up with therapy Plans to transfer to CIR today Will follow as needed for ortho this weekend-call [phone] for MD/PA on call  [name],[name] 07/24/2012, 2:14 PM

## 2012-07-24 NOTE — Progress Notes (Signed)
Pt transferred to Inpatient Rehab. Report called to unit. Pt transported with CPM machine. Thomas Hoff

## 2012-07-24 NOTE — Progress Notes (Signed)
Rehab admissions - I have approval for acute inpatient rehab admission from [facility] carrier.  Bed available and can admit to inpatient rehab today.  Call me for questions.  #[phone]

## 2012-07-24 NOTE — Progress Notes (Signed)
Physical Therapy Treatment Patient Details Name: Susan Welch MRN: [id number] DOB: [birth year] Today's Date: 07/24/2012 Time: 4098-1191 PT Time Calculation (min): 32 min  PT Assessment / Plan / Recommendation  PT Comments   Pt demonstrates improvements in functional mobility today requiring less assist for transfers.  Pt tolerated TKA there-ex well but during ambulation patient did become fatigued, pale, diaphoretic and complained of "queezy" feeling. BP assessed and mildly elevated 140s/90s (nsg aware). Assisted patient back to room. Patient placed in CPM machine with increased flexion to 70 degrees. Pt tolerating well. Will continue to see as indicated.    Follow Up Recommendations  CIR           Equipment Recommendations   (TBD)    Recommendations for Other Services Rehab consult;OT consult  Frequency Min 3X/week   Progress towards PT Goals Progress towards PT goals: Progressing toward goals  Plan Current plan remains appropriate    Precautions / Restrictions Precautions Precautions: Knee;Sternal Precaution Comments: educated patient on sternal and knee precautions, will continue to enforce Restrictions Weight Bearing Restrictions: Yes RLE Weight Bearing: Weight bearing as tolerated Other Position/Activity Restrictions: Sternal precautions   Pertinent Vitals/Pain 4/10; o2 sats 96% on rm air    Mobility  Bed Mobility Bed Mobility: Sitting - Scoot to Edge of Bed;Sit to Supine Sitting - Scoot to [place] of Bed: 4: Min assist Transfers Transfers: Sit to Stand;Stand to [employer] Transfers Sit to Stand: 3: Mod assist;From bed;From chair/3-in-1 Stand to Sit: 3: Mod assist;To bed;To chair/3-in-1 Stand Pivot Transfers: 3: Mod assist Details for Transfer Assistance: VCs for technique and compliance with precautions Ambulation/Gait Ambulation/Gait Assistance: 4: Min assist Ambulation Distance (Feet): 36 Feet Assistive device: 4-wheeled walker Ambulation/Gait Assistance  Details: Pt became diaphoretic and "queezy" during ambulation, seated rest break but patient unable to continue further. BP assess 140s/90s. Patient with rapid shallow breaths (O2 levels remained >95% on rm air).  Gait Pattern: Step-to pattern Gait velocity: decreased    Exercises Total Joint Exercises Ankle Circles/Pumps: AROM;Both;20 reps Heel Slides: AROM;Right;10 reps Long Arc Quad: AROM;Right;10 reps Other Exercises Other Exercises: Seated knee flexion bilaterally to propel chair back to room (34 steps)     PT Goals (current goals can now be found in the care plan section) Acute Rehab PT Goals Patient Stated Goal: to get better PT Goal Formulation: With patient Time For Goal Achievement: 08/05/12 Potential to Achieve Goals: Fair  Visit Information  Last PT Received On: 07/24/12 Assistance Needed: +1 History of Present Illness: Pt was scheduled TKA. Pt with introperative arrest, cardiac cath, and now is s/p CABGx4.     Subjective Data  Subjective: I have to use the bathroom Patient Stated Goal: to get better   Cognition  Cognition Arousal/Alertness: Awake/alert Behavior During Therapy: WFL for tasks assessed/performed Overall Cognitive Status: Within Functional Limits for tasks assessed       End of Session PT - End of Session Equipment Utilized During Treatment: Gait belt;Oxygen Activity Tolerance: Patient limited by fatigue;Patient limited by pain Patient left: in bed;with call bell/phone within reach;with nursing/sitter in room Nurse Communication: Mobility status;Precautions;Weight bearing status   GP     Fabio Asa 07/24/2012, 3:48 PM Charlotte Crumb, PT DPT  [phone]

## 2012-07-24 NOTE — Progress Notes (Signed)
Physical Therapy Treatment Patient Details Name: Susan Welch MRN: [id number] DOB: 11-Feb-1945 Today's Date: 07/24/2012 Time: 4098-1191 PT Time Calculation (min): 10 min  PT Assessment / Plan / Recommendation  PT Comments   Assisted patient with removal of CPM machine (pt tolerated 2 hours -2 to 70 degrees). Discussed total joint ther-ex to perform at EOB to increase knee flexion ROM. Patient performed bed mobility with assist. Pt instructed to perform another 2-3 hours in CPM this evening. Will continue to see as indicated and progress activity as tolerated.    07/24/12 1551  PT Visit Information  Last PT Received On 07/24/12  Assistance Needed +1  PT Time Calculation  PT Start Time 1258  PT Stop Time 1308  PT Time Calculation (min) 10 min  Subjective Data  Subjective I feel a little better now  Precautions  Precautions Knee;Sternal  Cognition  Arousal/Alertness Awake/alert  Behavior During Therapy WFL for tasks assessed/performed  Overall Cognitive Status Within Functional Limits for tasks assessed  Bed Mobility  Bed Mobility Supine to Sit;Sitting - Scoot to Edge of Bed  Supine to Sit 4: Min assist  Sitting - Scoot to [place] of Bed 4: Min guard  PT - End of Session  Patient left in bed;with call bell/phone within reach (seated EOB )  Nurse Communication Mobility status  PT - Assessment/Plan  PT Plan Current plan remains appropriate  PT Frequency Min 3X/week  Recommendations for Other Services Rehab consult;OT consult  Follow Up Recommendations CIR  PT equipment (TBD)  PT Goal Progression  Progress towards PT goals Progressing toward goals  Acute Rehab PT Goals  PT Goal Formulation With patient  Time For Goal Achievement 08/05/12  Potential to Achieve Goals Fair  PT General Charges  $$ ACUTE PT VISIT 1 Procedure  PT Treatments  $Therapeutic Activity 8-22 mins    Charlotte Crumb, PT DPT  [phone]

## 2012-07-24 NOTE — PMR Pre-admission (Signed)
PMR Admission Coordinator Pre-Admission Assessment  Patient: Susan Welch is an 67 y.o., female MRN: [id number] DOB: 1945/06/18 Height: 5\' 2"  (157.5 cm) Weight: 75.388 kg (166 lb 3.2 oz)              Insurance Information HMO: Yes   PPO:       PCP:       IPA:       80/20:       OTHER:  Group # [id number] PRIMARY: Blue Medicare      Policy#: [id number]      Subscriber: [name] CM Name:                                Phone#:                              Fax#: [phone] Pre-Cert#:                              Employer: Worked fulltime Benefits:  Phone #: [phone]     Name:   Eff. Date: 01/08/12     Deduct: $0      Out of Pocket Max: $3400(met $145.85)      Life Max: unlimited CIR: $170 days 1-7, $0 days 8+      SNF: $0 days 1-10, $50 days 11-100 Outpatient: no visit limit     Co-Pay: $35/visit Home Health: 100%      Co-Pay: none DME: 80%     Co-Pay: 20% Providers: in network  Emergency Contact Information Contact Information   Name Relation Home Work Mobile   Woodall Son [phone]     Dailah, Opperman   [phone]     Current Medical History  Patient Admitting Diagnosis: Right TKA with intraoperative cardiac arrest and CABG   History of Present Illness: A 67 y.o. right-handed female with history of hypertension. Patient independent prior to admission working full time. Admitted 07/14/2012 with end-stage degenerative changes right knee and underwent total knee replacement 07/15/2012 per Dr. Eulah Pont. Patient with intraoperative cardiac arrest and was resuscitated with cardiology services consulted. EKG showing inferior wall myocardial infarction. Cardiac catheterization showed severe three-vessel coronary artery disease with total occlusion of right coronary artery. Underwent CABG x4 07/21/2012 per Dr. Tyrone Sage. Patient maintained on subcutaneous Lovenox for DVT prophylaxis. Hospital course anemia 7.7 and monitored. Physical therapy evaluation completed 07/22/2012  patient is weightbearing as tolerated right lower extremity after total knee replacement with sternal precautions. Recommendations have been made for physical medicine rehabilitation consult to consider inpatient rehabilitation services.   Past Medical History  Past Medical History  Diagnosis Date  . Arthritis     a. bilat knees s/p L TKA 07/15/2012  . Anemia   . Hypertension   . Anxiety     Hx: of situational anxiety  . Headache(784.0)     Hx: of migraines  . Hyperlipidemia   . Panic attacks   . Ventricular fibrillation     a. 07/15/2012 in setting of R TKA    Family History  family history includes Thyroid disease in her mother.  Prior Rehab/Hospitalizations: No previous rehab admissions   Current Medications  Current facility-administered medications:0.9 %  sodium chloride infusion, 250 mL, Intravenous, PRN, Delight Ovens, MD;  aspirin EC tablet 325 mg, 325 mg, Oral, Daily, Delight Ovens, MD, 325 mg  at 07/24/12 0959;  bisacodyl (DULCOLAX) EC tablet 10 mg, 10 mg, Oral, Daily PRN, Delight Ovens, MD;  bisacodyl (DULCOLAX) suppository 10 mg, 10 mg, Rectal, Daily PRN, Delight Ovens, MD docusate sodium (COLACE) capsule 200 mg, 200 mg, Oral, Daily, Delight Ovens, MD, 200 mg at 07/24/12 0959;  enoxaparin (LOVENOX) injection 30 mg, 30 mg, Subcutaneous, Q24H, Delight Ovens, MD, 30 mg at 07/23/12 2252;  folic acid (FOLVITE) tablet 1 mg, 1 mg, Oral, Daily, Delight Ovens, MD, 1 mg at 07/24/12 1610;  furosemide (LASIX) tablet 40 mg, 40 mg, Oral, Daily, Delight Ovens, MD, 40 mg at 07/24/12 0959 insulin aspart (novoLOG) injection 0-24 Units, 0-24 Units, Subcutaneous, TID AC & HS, Delight Ovens, MD;  iron polysaccharides (NIFEREX) capsule 150 mg, 150 mg, Oral, Daily, Gina L Collins, PA-C, 150 mg at 07/24/12 1002;  lisinopril (PRINIVIL,ZESTRIL) tablet 2.5 mg, 2.5 mg, Oral, Daily, Delight Ovens, MD, 2.5 mg at 07/24/12 9604;  metoprolol tartrate (LOPRESSOR) tablet 25 mg,  25 mg, Oral, BID, Gina L Collins, PA-C, 25 mg at 07/24/12 1003 ondansetron (ZOFRAN) injection 4 mg, 4 mg, Intravenous, Q6H PRN, Delight Ovens, MD;  ondansetron [facility]) tablet 4 mg, 4 mg, Oral, Q6H PRN, Delight Ovens, MD;  oxyCODONE (Oxy IR/ROXICODONE) immediate release tablet 5-10 mg, 5-10 mg, Oral, Q3H PRN, Delight Ovens, MD;  pantoprazole (PROTONIX) EC tablet 40 mg, 40 mg, Oral, QAC breakfast, Delight Ovens, MD, 40 mg at 07/24/12 5409 potassium chloride SA (K-DUR,KLOR-CON) CR tablet 20 mEq, 20 mEq, Oral, Daily, Delight Ovens, MD, 20 mEq at 07/24/12 8119;  rosuvastatin (CRESTOR) tablet 5 mg, 5 mg, Oral, q1800, Cassell Clement, MD, 5 mg at 07/23/12 1725;  sodium chloride 0.9 % injection 3 mL, 3 mL, Intravenous, Q12H, Delight Ovens, MD, 3 mL at 07/24/12 1003;  sodium chloride 0.9 % injection 3 mL, 3 mL, Intravenous, PRN, Delight Ovens, MD traMADol Janean Sark) tablet 50-100 mg, 50-100 mg, Oral, Q4H PRN, Delight Ovens, MD, 100 mg at 07/24/12 1478;  zolpidem (AMBIEN) tablet 5 mg, 5 mg, Oral, QHS PRN, Anabel Bene, RPH  Patients Current Diet: Cardiac  Precautions / Restrictions Precautions Precautions: Knee;Sternal Precaution Comments: educated patient on sternal and knee precautions, will continue to enforce Restrictions Weight Bearing Restrictions: Yes RUE Weight Bearing: Non weight bearing (sternal precautions) LUE Weight Bearing: Non weight bearing (sternal precautions) RLE Weight Bearing: Weight bearing as tolerated Other Position/Activity Restrictions: Sternal precautions   Prior Activity Level Community (5-7x/wk): Worked fulltime, went out daily.  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 Assistive Devices/Equipment: Eyeglasses Home Equipment: None  Prior Functional Level Prior Function Level of Independence: Independent  Current Functional Level Cognition  Overall Cognitive Status: Within Functional Limits for tasks  assessed Orientation Level: Oriented X4    Extremity Assessment (includes Sensation/Coordination)  Upper Extremity Assessment: Generalized weakness  Lower Extremity Assessment: Generalized weakness;RLE deficits/detail RLE Deficits / Details: R TKA, limited ROm RLE: Unable to fully assess due to pain    ADLs  Grooming: Set up Where Assessed - Grooming: Unsupported sitting Upper Body Bathing: Minimal assistance Where Assessed - Upper Body Bathing: Supported sitting Lower Body Bathing: Maximal assistance Where Assessed - Lower Body Bathing: Supported sit to stand Upper Body Dressing: Minimal assistance Where Assessed - Upper Body Dressing: Unsupported sitting Lower Body Dressing: Maximal assistance Where Assessed - Lower Body Dressing: Supported sit to Pharmacist, hospital: Moderate assistance Toilet Transfer Method: Sit to stand Equipment Used: Gait  belt;Wheelchair Transfers/Ambulation Related to ADLs: mod A ADL Comments: limited with LB ADL    Mobility  Bed Mobility: Sitting - Scoot to Edge of Bed;Sit to Supine Supine to Sit: 4: Min assist;HOB elevated;With rails Sitting - Scoot to Edge of Bed: 4: Min assist Sitting - Scoot to [place] of Bed: Patient Percentage: 30% Sit to Supine: 1: +2 Total assist;HOB flat Sit to Supine: Patient Percentage: 60%    Transfers  Transfers: Sit to Stand;Stand to Sit Sit to Stand: 1: +2 Total assist;From chair/3-in-1 Sit to Stand: Patient Percentage: 60% Stand to Sit: 1: +2 Total assist Stand to Sit: Patient Percentage: 70%    Ambulation / Gait / Stairs / Psychologist, prison and probation services  Ambulation/Gait Ambulation/Gait Assistance: 4: Min assist Ambulation/Gait: Patient Percentage: 80% Ambulation Distance (Feet): 40 Feet Assistive device:  (pushing wc) Ambulation/Gait Assistance Details: patient with increased pain and diffiuclty adhering to sternal precautions, unable to bear increased weight through RLE seocndary to TKA. very minimal steps  tolerated Gait Pattern: Step-to pattern Gait velocity: decreased    Posture / Balance      Special needs/care consideration BiPAP/CPAP No CPM Yes Continuous Drip IV No Dialysis No       Life Vest No Oxygen No Special Bed No Trach Size No Wound Vac (area)No      Skin Has sternal wound and knee incisional wounds                              Bowel mgmt: Had BM on 07/24/12 Bladder mgmt: Voiding on BSC Diabetic mgmt No   Previous Home Environment Living Arrangements: Alone Available Help at Discharge: Family;Available PRN/intermittently Type of Home: Apartment Care Facility [facility]: [facility] Layout: One level Home Access: Level entry Bathroom Shower/Tub: Engineer, manufacturing systems: Standard Bathroom Accessibility: Yes How Accessible: Accessible via walker Home Care Services: No  Discharge Living Setting Plans for Discharge Living Setting: Alone;Apartment Type of Home at Discharge: Apartment Discharge Home Layout: One level Discharge Home Access: Level entry Does the patient have any problems obtaining your medications?: No  Social/Family/Support Systems Patient Roles: Parent Contact Information: Adlean Hardeman - son Anticipated Caregiver: self, may ask dtr-in-law to assist if needed Anticipated Caregiver's Contact Information: Micah Noel - son [phone] Ability/Limitations of Caregiver: Son and dtr-in-law work Engineer, structural Availability: Intermittent Discharge Plan Discussed with Primary Caregiver: Yes Is Caregiver In Agreement with Plan?: Yes Does Caregiver/Family have Issues with Lodging/Transportation while Pt is in Rehab?: No  Goals/Additional Needs Patient/Family Goal for Rehab: PT mod I, OT mod I to supervision goals, no ST needs Expected length of stay: 2-3 weeks Cultural Considerations: None Dietary Needs: Heart diet with thin liquids Equipment Needs: TBD Pt/Family Agrees to Admission and willing to participate: Yes Program Orientation Provided & Reviewed  with Pt/Caregiver Including Roles  & Responsibilities: Yes  Decrease burden of Care through IP rehab admission:  Not applicable  Possible need for SNF placement upon discharge: Not likely  Patient Condition: This patient's condition remains as documented in the consult dated 07/23/12, in which the Rehabilitation Physician determined and documented that the patient's condition is appropriate for intensive rehabilitative care in an inpatient rehabilitation facility. Will admit to inpatient rehab today.  Preadmission Screen Completed By:  Trish Mage, 07/24/2012 11:56 AM ______________________________________________________________________   Discussed status with Dr. Riley Kill on 07/24/12 at 1312 and received telephone approval for admission today.  Admission Coordinator:  Trish Mage, time1312/Date07/18/14

## 2012-07-24 NOTE — Progress Notes (Signed)
[id number] Cardiac Rehab Completed discharge education with pt She voices understanding. Pt agrees to Outpt. [facility] in [place], will send referral. Melina Copa RN

## 2012-07-24 NOTE — H&P (Signed)
Physical Medicine and Rehabilitation Admission H&P    No chief complaint on file. : HPI: Susan Welch is a 67 y.o. right-handed female with history of hypertension. Patient independent prior to admission working full time. Admitted 07/14/2012 with end-stage degenerative changes right knee and underwent total knee replacement 07/15/2012 per Dr. Murphy. Patient with intraoperative cardiac arrest and was resuscitated with cardiology services consulted. EKG showing inferior wall myocardial infarction. Cardiac catheterization showed severe three-vessel coronary artery disease with total occlusion of right coronary artery. Underwent CABG x4 07/21/2012 per Dr. Gerhardt. Patient maintained on subcutaneous Lovenox for DVT prophylaxis. Hospital course anemia 8.0 and monitored. Physical and occupational therapy evaluations completed 07/22/2012 patient is weightbearing as tolerated right lower extremity after total knee replacement with sternal precautions. Recommendations have been made for physical medicine rehabilitation consult to consider inpatient rehabilitation services. Patient was felt to be a good candidate for inpatient rehabilitation services and was admitted for comprehensive rehabilitation program  Review of Systems  Musculoskeletal: Positive for myalgias.  Neurological: Positive for headaches.  Psychiatric/Behavioral:  Anxiety/panic attacks  All other systems reviewed and are negative   Past Medical History  Diagnosis Date  . Arthritis     a. bilat knees s/p L TKA 07/15/2012  . Anemia   . Hypertension   . Anxiety     Hx: of situational anxiety  . Headache(784.0)     Hx: of migraines  . Hyperlipidemia   . Panic attacks   . Ventricular fibrillation     a. 07/15/2012 in setting of R TKA   Past Surgical History  Procedure Laterality Date  . Abdominal hysterectomy    . Dilation and curettage of uterus    . Total knee arthroplasty Right 07/15/2012    Procedure: TOTAL KNEE ARTHROPLASTY;   Surgeon: Daniel F Murphy, MD;  Location: [facility] OR;  Service: Orthopedics;  Laterality: Right;  drapes pulled back to provide cpr, new drape applied, protocol followed  . Coronary artery bypass graft N/A 07/21/2012    Procedure: CORONARY ARTERY BYPASS GRAFTING (CABG);  Surgeon: Edward B Gerhardt, MD;  Location: [facility] OR;  Service: Open Heart Surgery;  Laterality: N/A;  Times 4 using left internal mammary artery and endoscopically harvested left saphenous vein.  . Intraoperative transesophageal echocardiogram N/A 07/21/2012    Procedure: INTRAOPERATIVE TRANSESOPHAGEAL ECHOCARDIOGRAM;  Surgeon: Edward B Gerhardt, MD;  Location: [facility] OR;  Service: Open Heart Surgery;  Laterality: N/A;   Family History  Problem Relation Age of Onset  . Thyroid disease Mother     has thyroid removed   Social History:  reports that she quit smoking about 12 years ago. She does not have any smokeless tobacco history on file. She reports that  drinks alcohol. She reports that she does not use illicit drugs. Allergies:  Allergies  Allergen Reactions  . Statins Other (See Comments)    Joint pain  . Nickel Rash   Medications Prior to Admission  Medication Sig Dispense Refill  . acetaminophen (TYLENOL) 500 MG tablet Take 1,000 mg by mouth daily.      . calcium citrate-vitamin D (CITRACAL+D) 315-200 MG-UNIT per tablet Take 1 tablet by mouth daily.      . lisinopril-hydrochlorothiazide (PRINZIDE,ZESTORETIC) 20-25 MG per tablet Take 1 tablet by mouth daily.  90 tablet  3  . OVER THE COUNTER MEDICATION Take 500 mg by mouth daily.        Home: Home Living Family/patient expects to be discharged to:: Private residence Living Arrangements: Alone Available Help at Discharge: Family;Available PRN/intermittently   Type of Home: Apartment Home Access: Level entry Home Layout: One level Home Equipment: None   Functional History:    Functional Status:  Mobility: Bed Mobility Bed Mobility: Sitting - Scoot to Edge of Bed;Sit to  Supine Supine to Sit: 4: Min assist;HOB elevated;With rails Sitting - Scoot to Edge of Bed: 4: Min assist Sitting - Scoot to Edge of Bed: Patient Percentage: 30% Sit to Supine: 1: +2 Total assist;HOB flat Sit to Supine: Patient Percentage: 60% Transfers Transfers: Sit to Stand;Stand to Sit Sit to Stand: 1: +2 Total assist;From chair/3-in-1 Sit to Stand: Patient Percentage: 60% Stand to Sit: 1: +2 Total assist Stand to Sit: Patient Percentage: 70% Ambulation/Gait Ambulation/Gait Assistance: 4: Min assist Ambulation/Gait: Patient Percentage: 80% Ambulation Distance (Feet): 40 Feet Assistive device:  (pushing wc) Ambulation/Gait Assistance Details: patient with increased pain and diffiuclty adhering to sternal precautions, unable to bear increased weight through RLE seocndary to TKA. very minimal steps tolerated Gait Pattern: Step-to pattern Gait velocity: decreased    ADL: ADL Grooming: Set up Where Assessed - Grooming: Unsupported sitting Upper Body Bathing: Minimal assistance Where Assessed - Upper Body Bathing: Supported sitting Lower Body Bathing: Maximal assistance Where Assessed - Lower Body Bathing: Supported sit to stand Upper Body Dressing: Minimal assistance Where Assessed - Upper Body Dressing: Unsupported sitting Lower Body Dressing: Maximal assistance Where Assessed - Lower Body Dressing: Supported sit to stand Toilet Transfer: Moderate assistance Toilet Transfer Method: Sit to stand Equipment Used: Gait belt;Wheelchair Transfers/Ambulation Related to ADLs: mod A ADL Comments: limited with LB ADL  Cognition: Cognition Overall Cognitive Status: Within Functional Limits for tasks assessed Orientation Level: Oriented X4 Cognition Arousal/Alertness: Awake/alert Behavior During Therapy: WFL for tasks assessed/performed Overall Cognitive Status: Within Functional Limits for tasks assessed  Physical Exam: Blood pressure 139/77, pulse 99, temperature 98.7 F (37.1  C), temperature source Oral, resp. rate 20, height 5' 2" (1.575 m), weight 75.388 kg (166 lb 3.2 oz), SpO2 98.00%. Constitutional: She is oriented to person, place, and time.  HENT: oral mucosa pink and moist, dentition good Head: Normocephalic.  Eyes: EOM are normal.  Neck: Normal range of motion. Neck supple. No thyromegaly present.  Cardiovascular:  Cardiac rate control  With reg rhythm, no murmurs Pulmonary/Chest: Breath sounds normal. No respiratory distress. No wheezes, rales. Limited expansion due to chest discomfort Abdominal: Soft. Bowel sounds are normal. She exhibits no distension.  Neurological: She is alert and oriented to person, place, and time.  Follows full commands. Cognitively within normal limits.  CN 2-12 normal. Sensory exam intact. Motor 4+/5 bilateral UE's. LLE 3-4 hf to 4/5 at ankle. RLE is 2/5 HF, 1+ KE, 4/5 at ankle.  Skin:  Midline chest incision healing. Right knee incision with staples intact, no drainage. LLE with multiple ecchymoses and incisions from harvest sites.  Psychiatric: She has a normal mood and affect   Results for orders placed during the hospital encounter of 07/15/12 (from the past 48 hour(s))  GLUCOSE, CAPILLARY     Status: Abnormal   Collection Time    07/22/12  7:04 AM      Result Value Range   Glucose-Capillary 111 (*) 70 - 99 mg/dL  GLUCOSE, CAPILLARY     Status: Abnormal   Collection Time    07/22/12  7:34 AM      Result Value Range   Glucose-Capillary 109 (*) 70 - 99 mg/dL  GLUCOSE, CAPILLARY     Status: Abnormal   Collection Time    07/22/12 11:23 AM        Result Value Range   Glucose-Capillary 131 (*) 70 - 99 mg/dL  MAGNESIUM     Status: None   Collection Time    07/22/12  5:00 PM      Result Value Range   Magnesium 2.5  1.5 - 2.5 mg/dL  CBC     Status: Abnormal   Collection Time    07/22/12  5:00 PM      Result Value Range   WBC 11.2 (*) 4.0 - 10.5 K/uL   RBC 2.53 (*) 3.87 - 5.11 MIL/uL   Hemoglobin 7.7 (*) 12.0 -  15.0 g/dL   HCT 21.9 (*) 36.0 - 46.0 %   MCV 86.6  78.0 - 100.0 fL   MCH 30.4  26.0 - 34.0 pg   MCHC 35.2  30.0 - 36.0 g/dL   RDW 14.3  11.5 - 15.5 %   Platelets 185  150 - 400 K/uL  CREATININE, SERUM     Status: None   Collection Time    07/22/12  5:00 PM      Result Value Range   Creatinine, Ser 0.65  0.50 - 1.10 mg/dL   GFR calc non Af Amer >90  >90 mL/min   GFR calc Af Amer >90  >90 mL/min   Comment:            The eGFR has been calculated     using the CKD EPI equation.     This calculation has not been     validated in all clinical     situations.     eGFR's persistently     <90 mL/min signify     possible Chronic Kidney Disease.  GLUCOSE, CAPILLARY     Status: Abnormal   Collection Time    07/22/12  5:08 PM      Result Value Range   Glucose-Capillary 124 (*) 70 - 99 mg/dL  POCT I-STAT, CHEM 8     Status: Abnormal   Collection Time    07/22/12  5:10 PM      Result Value Range   Sodium 137  135 - 145 mEq/L   Potassium 4.0  3.5 - 5.1 mEq/L   Chloride 99  96 - 112 mEq/L   BUN 13  6 - 23 mg/dL   Creatinine, Ser 0.80  0.50 - 1.10 mg/dL   Glucose, Bld 134 (*) 70 - 99 mg/dL   Calcium, Ion 1.03 (*) 1.13 - 1.30 mmol/L   TCO2 25  0 - 100 mmol/L   Hemoglobin 8.5 (*) 12.0 - 15.0 g/dL   HCT 25.0 (*) 36.0 - 46.0 %  GLUCOSE, CAPILLARY     Status: Abnormal   Collection Time    07/22/12  7:52 PM      Result Value Range   Glucose-Capillary 104 (*) 70 - 99 mg/dL   Comment 1 Documented in Chart     Comment 2 Notify RN    GLUCOSE, CAPILLARY     Status: Abnormal   Collection Time    07/22/12 11:08 PM      Result Value Range   Glucose-Capillary 160 (*) 70 - 99 mg/dL   Comment 1 Documented in Chart     Comment 2 Notify RN    GLUCOSE, CAPILLARY     Status: Abnormal   Collection Time    07/23/12  5:11 AM      Result Value Range   Glucose-Capillary 101 (*) 70 - 99 mg/dL   Comment 1 Documented in Chart       Comment 2 Notify RN    BASIC METABOLIC PANEL     Status: Abnormal    Collection Time    07/23/12  5:55 AM      Result Value Range   Sodium 135  135 - 145 mEq/L   Potassium 4.3  3.5 - 5.1 mEq/L   Chloride 100  96 - 112 mEq/L   CO2 23  19 - 32 mEq/L   Glucose, Bld 109 (*) 70 - 99 mg/dL   BUN 19  6 - 23 mg/dL   Creatinine, Ser 0.58  0.50 - 1.10 mg/dL   Calcium 8.3 (*) 8.4 - 10.5 mg/dL   GFR calc non Af Amer >90  >90 mL/min   GFR calc Af Amer >90  >90 mL/min   Comment:            The eGFR has been calculated     using the CKD EPI equation.     This calculation has not been     validated in all clinical     situations.     eGFR's persistently     <90 mL/min signify     possible Chronic Kidney Disease.  CBC     Status: Abnormal   Collection Time    07/23/12  5:55 AM      Result Value Range   WBC 16.6 (*) 4.0 - 10.5 K/uL   RBC 3.05 (*) 3.87 - 5.11 MIL/uL   Hemoglobin 9.1 (*) 12.0 - 15.0 g/dL   HCT 26.6 (*) 36.0 - 46.0 %   MCV 87.2  78.0 - 100.0 fL   MCH 29.8  26.0 - 34.0 pg   MCHC 34.2  30.0 - 36.0 g/dL   RDW 14.8  11.5 - 15.5 %   Platelets 206  150 - 400 K/uL  GLUCOSE, CAPILLARY     Status: None   Collection Time    07/23/12  7:07 AM      Result Value Range   Glucose-Capillary 97  70 - 99 mg/dL  GLUCOSE, CAPILLARY     Status: Abnormal   Collection Time    07/23/12 11:27 AM      Result Value Range   Glucose-Capillary 100 (*) 70 - 99 mg/dL   Comment 1 Notify RN    GLUCOSE, CAPILLARY     Status: Abnormal   Collection Time    07/23/12  4:23 PM      Result Value Range   Glucose-Capillary 125 (*) 70 - 99 mg/dL   Comment 1 Notify RN     Comment 2 Documented in Chart    GLUCOSE, CAPILLARY     Status: Abnormal   Collection Time    07/23/12  9:15 PM      Result Value Range   Glucose-Capillary 117 (*) 70 - 99 mg/dL  CBC     Status: Abnormal   Collection Time    07/24/12  4:00 AM      Result Value Range   WBC 14.1 (*) 4.0 - 10.5 K/uL   RBC 2.65 (*) 3.87 - 5.11 MIL/uL   Hemoglobin 8.0 (*) 12.0 - 15.0 g/dL   HCT 23.6 (*) 36.0 - 46.0 %    MCV 89.1  78.0 - 100.0 fL   MCH 30.2  26.0 - 34.0 pg   MCHC 33.9  30.0 - 36.0 g/dL   RDW 14.9  11.5 - 15.5 %   Platelets 265  150 - 400 K/uL  BASIC METABOLIC PANEL     Status:   Abnormal   Collection Time    07/24/12  4:00 AM      Result Value Range   Sodium 136  135 - 145 mEq/L   Potassium 4.0  3.5 - 5.1 mEq/L   Chloride 100  96 - 112 mEq/L   CO2 29  19 - 32 mEq/L   Glucose, Bld 102 (*) 70 - 99 mg/dL   BUN 20  6 - 23 mg/dL   Creatinine, Ser 0.55  0.50 - 1.10 mg/dL   Calcium 8.0 (*) 8.4 - 10.5 mg/dL   GFR calc non Af Amer >90  >90 mL/min   GFR calc Af Amer >90  >90 mL/min   Comment:            The eGFR has been calculated     using the CKD EPI equation.     This calculation has not been     validated in all clinical     situations.     eGFR's persistently     <90 mL/min signify     possible Chronic Kidney Disease.   Dg Chest Port 1 View  07/23/2012   *RADIOLOGY REPORT*  Clinical Data: Status post CABG.  PORTABLE CHEST - 1 VIEW  Comparison: Plain film chest 07/22/2012.  Findings: Swan-Ganz catheter has been removed with a right IJ sheath remaining in place.  Left chest tube is again identified. There is a tiny left apical pneumothorax.  Small bilateral pleural effusions, larger on the right, have increased and there is some associated basilar atelectasis.  IMPRESSION:  1.  Tiny left apical pneumothorax with a left chest tube in place. 2.  Some increase in small bilateral pleural effusions and basilar atelectasis, greater on the right.   Original Report Authenticated By: Thomas D'Alessio, M.D.    Post Admission Physician Evaluation: 1. Functional deficits secondary  to right TKA with intra-operative cardiac arrest, s/p CABG x 4 on 07/21/12. 2. Patient is admitted to receive collaborative, interdisciplinary care between the physiatrist, rehab nursing staff, and therapy team. 3. Patient's level of medical complexity and substantial therapy needs in context of that medical necessity  cannot be provided at a lesser intensity of care such as a SNF. 4. Patient has experienced substantial functional loss from his/her baseline which was documented above under the "Functional History" and "Functional Status" headings.  Judging by the patient's diagnosis, physical exam, and functional history, the patient has potential for functional progress which will result in measurable gains while on inpatient rehab.  These gains will be of substantial and practical use upon discharge  in facilitating mobility and self-care at the household level. 5. Physiatrist will provide 24 hour management of medical needs as well as oversight of the therapy plan/treatment and provide guidance as appropriate regarding the interaction of the two. 6. 24 hour rehab nursing will assist with bladder management, bowel management, safety, skin/wound care, disease management, medication administration, pain management and patient education  and help integrate therapy concepts, techniques,education, etc. 7. PT will assess and treat for/with: Lower extremity strength, range of motion, stamina, balance, functional mobility, safety, adaptive techniques and equipment, post-operative precautions, knee ROM, pain mgt, education.   Goals are: mod I to supervision. 8. OT will assess and treat for/with: ADL's, functional mobility, safety, upper extremity strength, adaptive techniques and equipment, post-op precautions, pain mgt, education.   Goals are: mod I to supervision. 9. SLP will assess and treat for/with: n/a.  Goals are: n/a. 10. Case Management and Social Worker will assess and   treat for psychological issues and discharge planning. 11. Team conference will be held weekly to assess progress toward goals and to determine barriers to discharge. 12. Patient will receive at least 3 hours of therapy per day at least 5 days per week. 13. ELOS: 8-14 days       14. Prognosis:  excellent   Medical Problem List and Plan: 1. Right  total knee arthroplasty for degenerative joint disease with intraoperative cardiac arrest/status post CABG x4 07/21/2012 2. DVT Prophylaxis/Anticoagulation: Subcutaneous Lovenox. Monitor platelet counts any signs of bleeding. Discussed checking vascular Dopplers 3. Pain Management: Oxycodone as needed. Monitor with increased mobility 4. Neuropsych: This patient is capable of making decisions on her own behalf. 5. Acute blood loss anemia. Followup CBC/continue Niferex 6. Hypertension. Lasix 40 mg daily, lisinopril 2.5 mg daily, Lopressor 12.5 mg twice a day. Monitor with increased mobility 7. Hyperlipidemia. Crestor   Zachary T. Swartz, MD, FAAPMR [place] [facility]  07/24/2012 

## 2012-07-24 NOTE — Progress Notes (Signed)
Rehab admissions - I have faxed information to [facility] requesting acute inpatient rehab admission.  I will follow up again when I hear from insurance carrier.  Call me for questions.  #[phone]

## 2012-07-24 NOTE — Progress Notes (Addendum)
       [address]       [place] [address]             [phone]          3 Days Post-Op Procedure(s) (LRB): CORONARY ARTERY BYPASS GRAFTING (CABG) (N/A) INTRAOPERATIVE TRANSESOPHAGEAL ECHOCARDIOGRAM (N/A)  Subjective: Pain controlled.  No appetite, but otherwise doing well.   Objective: Vital signs in last 24 hours: Patient Vitals for the past 24 hrs:  BP Temp Temp src Pulse Resp SpO2 Weight  07/24/12 0452 139/77 mmHg 98.7 F (37.1 C) Oral 99 20 98 % 166 lb 3.2 oz (75.388 kg)  07/23/12 1927 145/76 mmHg 98.5 F (36.9 C) Oral 103 24 97 % -  07/23/12 1447 138/73 mmHg - - 98 22 100 % -  07/23/12 1400 152/70 mmHg - - - 29 - -  07/23/12 1300 134/76 mmHg - - 89 20 100 % -  07/23/12 1200 132/111 mmHg - - 91 23 98 % -  07/23/12 1100 131/70 mmHg 98.7 F (37.1 C) Oral 89 29 98 % -  07/23/12 1000 136/76 mmHg - - 96 29 95 % -  07/23/12 0900 143/79 mmHg - - 100 22 98 % -   Current Weight  07/24/12 166 lb 3.2 oz (75.388 kg)  PRE-OPERATIVE WEIGHT: 70 kg    Intake/Output from previous day: 07/17 0701 - 07/18 0700 In: 720 [P.O.:720] Out: 1060 [Urine:1005; Chest Tube:55]    PHYSICAL EXAM:  Heart: RRR Lungs: Slightly diminished BS In bases Wound: Sternal wound clean and dry.  L leg incisions intact and dry, small amount sanguinous oozing from drain site. +L thigh ecchymosis. Extremities: Mild LE edema    Lab Results: CBC: Recent Labs  07/23/12 0555 07/24/12 0400  WBC 16.6* 14.1*  HGB 9.1* 8.0*  HCT 26.6* 23.6*  PLT 206 265   BMET:  Recent Labs  07/23/12 0555 07/24/12 0400  NA 135 136  K 4.3 4.0  CL 100 100  CO2 23 29  GLUCOSE 109* 102*  BUN 19 20  CREATININE 0.58 0.55  CALCIUM 8.3* 8.0*    PT/INR:  Recent Labs  07/21/12 1449  LABPROT 18.9*  INR 1.63*      Assessment/Plan: S/P Procedure(s) (LRB): CORONARY ARTERY BYPASS GRAFTING (CABG) (N/A) INTRAOPERATIVE TRANSESOPHAGEAL ECHOCARDIOGRAM (N/A)  CV- BPs trending up, sinus  tach around 100.  Will increase beta blocker, continue low dose ACE-I.  Vol overload- diurese.  Expected postop blood loss anemia- H/H down slightly.  Will add Fe and watch.  Mild leukocytosis, no fever. Continue to watch.  Continue PT.  May be ready for CIR by first of next week if she continues to progress.    LOS: 9 days    COLLINS,GINA H 07/24/2012  Waiting for insurance clearance for rehab Expect will not be until Monday that she can get there. I have seen and examined Susan Welch and agree with the above assessment  and plan.  Delight Ovens MD Beeper [phone] Office [phone] 07/24/2012 10:47 AM

## 2012-07-24 NOTE — H&P (View-Only) (Signed)
Physical Medicine and Rehabilitation Admission H&P    No chief complaint on file. : HPI: Susan Welch is a 67 y.o. right-handed female with history of hypertension. Patient independent prior to admission working full time. Admitted 07/14/2012 with end-stage degenerative changes right knee and underwent total knee replacement 07/15/2012 per Dr. Eulah Pont. Patient with intraoperative cardiac arrest and was resuscitated with cardiology services consulted. EKG showing inferior wall myocardial infarction. Cardiac catheterization showed severe three-vessel coronary artery disease with total occlusion of right coronary artery. Underwent CABG x4 07/21/2012 per Dr. Tyrone Sage. Patient maintained on subcutaneous Lovenox for DVT prophylaxis. Hospital course anemia 8.0 and monitored. Physical and occupational therapy evaluations completed 07/22/2012 patient is weightbearing as tolerated right lower extremity after total knee replacement with sternal precautions. Recommendations have been made for physical medicine rehabilitation consult to consider inpatient rehabilitation services. Patient was felt to be a good candidate for inpatient rehabilitation services and was admitted for comprehensive rehabilitation program  Review of Systems  Musculoskeletal: Positive for myalgias.  Neurological: Positive for headaches.  Psychiatric/Behavioral:  Anxiety/panic attacks  All other systems reviewed and are negative   Past Medical History  Diagnosis Date  . Arthritis     a. bilat knees s/p L TKA 07/15/2012  . Anemia   . Hypertension   . Anxiety     Hx: of situational anxiety  . Headache(784.0)     Hx: of migraines  . Hyperlipidemia   . Panic attacks   . Ventricular fibrillation     a. 07/15/2012 in setting of R TKA   Past Surgical History  Procedure Laterality Date  . Abdominal hysterectomy    . Dilation and curettage of uterus    . Total knee arthroplasty Right 07/15/2012    Procedure: TOTAL KNEE ARTHROPLASTY;   Surgeon: Loreta Ave, MD;  Location: [facility] OR;  Service: Orthopedics;  Laterality: Right;  drapes pulled back to provide cpr, new drape applied, protocol followed  . Coronary artery bypass graft N/A 07/21/2012    Procedure: CORONARY ARTERY BYPASS GRAFTING (CABG);  Surgeon: Delight Ovens, MD;  Location: [facility] OR;  Service: Open Heart Surgery;  Laterality: N/A;  Times 4 using left internal mammary artery and endoscopically harvested left saphenous vein.  . Intraoperative transesophageal echocardiogram N/A 07/21/2012    Procedure: INTRAOPERATIVE TRANSESOPHAGEAL ECHOCARDIOGRAM;  Surgeon: Delight Ovens, MD;  Location: [facility] OR;  Service: Open Heart Surgery;  Laterality: N/A;   Family History  Problem Relation Age of Onset  . Thyroid disease Mother     has thyroid removed   Social History:  reports that she quit smoking about 12 years ago. She does not have any smokeless tobacco history on file. She reports that  drinks alcohol. She reports that she does not use illicit drugs. Allergies:  Allergies  Allergen Reactions  . Statins Other (See Comments)    Joint pain  . Nickel Rash   Medications Prior to Admission  Medication Sig Dispense Refill  . acetaminophen (TYLENOL) 500 MG tablet Take 1,000 mg by mouth daily.      . calcium citrate-vitamin D (CITRACAL+D) 315-200 MG-UNIT per tablet Take 1 tablet by mouth daily.      Marland Kitchen lisinopril-hydrochlorothiazide (PRINZIDE,ZESTORETIC) 20-25 MG per tablet Take 1 tablet by mouth daily.  90 tablet  3  . OVER THE COUNTER MEDICATION Take 500 mg by mouth daily.        Home: Home Living Family/patient expects to be discharged to:: Private residence Living Arrangements: Alone Available Help at Discharge: Family;Available PRN/intermittently  Type of Home: Apartment Home Access: Level entry Home Layout: One level Home Equipment: None   Functional History:    Functional Status:  Mobility: Bed Mobility Bed Mobility: Sitting - Scoot to Edge of Bed;Sit to  Supine Supine to Sit: 4: Min assist;HOB elevated;With rails Sitting - Scoot to Edge of Bed: 4: Min assist Sitting - Scoot to [place] of Bed: Patient Percentage: 30% Sit to Supine: 1: +2 Total assist;HOB flat Sit to Supine: Patient Percentage: 60% Transfers Transfers: Sit to Stand;Stand to Sit Sit to Stand: 1: +2 Total assist;From chair/3-in-1 Sit to Stand: Patient Percentage: 60% Stand to Sit: 1: +2 Total assist Stand to Sit: Patient Percentage: 70% Ambulation/Gait Ambulation/Gait Assistance: 4: Min assist Ambulation/Gait: Patient Percentage: 80% Ambulation Distance (Feet): 40 Feet Assistive device:  (pushing wc) Ambulation/Gait Assistance Details: patient with increased pain and diffiuclty adhering to sternal precautions, unable to bear increased weight through RLE seocndary to TKA. very minimal steps tolerated Gait Pattern: Step-to pattern Gait velocity: decreased    ADL: ADL Grooming: Set up Where Assessed - Grooming: Unsupported sitting Upper Body Bathing: Minimal assistance Where Assessed - Upper Body Bathing: Supported sitting Lower Body Bathing: Maximal assistance Where Assessed - Lower Body Bathing: Supported sit to stand Upper Body Dressing: Minimal assistance Where Assessed - Upper Body Dressing: Unsupported sitting Lower Body Dressing: Maximal assistance Where Assessed - Lower Body Dressing: Supported sit to Pharmacist, hospital: Moderate assistance Toilet Transfer Method: Sit to stand Equipment Used: Gait belt;Wheelchair Transfers/Ambulation Related to ADLs: mod A ADL Comments: limited with LB ADL  Cognition: Cognition Overall Cognitive Status: Within Functional Limits for tasks assessed Orientation Level: Oriented X4 Cognition Arousal/Alertness: Awake/alert Behavior During Therapy: WFL for tasks assessed/performed Overall Cognitive Status: Within Functional Limits for tasks assessed  Physical Exam: Blood pressure 139/77, pulse 99, temperature 98.7 F (37.1  C), temperature source Oral, resp. rate 20, height 5\' 2"  (1.575 m), weight 75.388 kg (166 lb 3.2 oz), SpO2 98.00%. Constitutional: She is oriented to person, place, and time.  HENT: oral mucosa pink and moist, dentition good Head: Normocephalic.  Eyes: EOM are normal.  Neck: Normal range of motion. Neck supple. No thyromegaly present.  Cardiovascular:  Cardiac rate control  With reg rhythm, no murmurs Pulmonary/Chest: Breath sounds normal. No respiratory distress. No wheezes, rales. Limited expansion due to chest discomfort Abdominal: Soft. Bowel sounds are normal. She exhibits no distension.  Neurological: She is alert and oriented to person, place, and time.  Follows full commands. Cognitively within normal limits.  CN 2-12 normal. Sensory exam intact. Motor 4+/5 bilateral UE's. LLE 3-4 hf to 4/5 at ankle. RLE is 2/5 HF, 1+ KE, 4/5 at ankle.  Skin:  Midline chest incision healing. Right knee incision with staples intact, no drainage. LLE with multiple ecchymoses and incisions from harvest sites.  Psychiatric: She has a normal mood and affect   Results for orders placed during the hospital encounter of 07/15/12 (from the past 48 hour(s))  GLUCOSE, CAPILLARY     Status: Abnormal   Collection Time    07/22/12  7:04 AM      Result Value Range   Glucose-Capillary 111 (*) 70 - 99 mg/dL  GLUCOSE, CAPILLARY     Status: Abnormal   Collection Time    07/22/12  7:34 AM      Result Value Range   Glucose-Capillary 109 (*) 70 - 99 mg/dL  GLUCOSE, CAPILLARY     Status: Abnormal   Collection Time    07/22/12 11:23 AM  Result Value Range   Glucose-Capillary 131 (*) 70 - 99 mg/dL  MAGNESIUM     Status: None   Collection Time    07/22/12  5:00 PM      Result Value Range   Magnesium 2.5  1.5 - 2.5 mg/dL  CBC     Status: Abnormal   Collection Time    07/22/12  5:00 PM      Result Value Range   WBC 11.2 (*) 4.0 - 10.5 K/uL   RBC 2.53 (*) 3.87 - 5.11 MIL/uL   Hemoglobin 7.7 (*) 12.0 -  15.0 g/dL   HCT 45.4 (*) 09.8 - 11.9 %   MCV 86.6  78.0 - 100.0 fL   MCH 30.4  26.0 - 34.0 pg   MCHC 35.2  30.0 - 36.0 g/dL   RDW 14.7  82.9 - 56.2 %   Platelets 185  150 - 400 K/uL  CREATININE, SERUM     Status: None   Collection Time    07/22/12  5:00 PM      Result Value Range   Creatinine, Ser 0.65  0.50 - 1.10 mg/dL   GFR calc non Af Amer >90  >90 mL/min   GFR calc Af Amer >90  >90 mL/min   Comment:            The eGFR has been calculated     using the CKD EPI equation.     This calculation has not been     validated in all clinical     situations.     eGFR's persistently     <90 mL/min signify     possible Chronic Kidney Disease.  GLUCOSE, CAPILLARY     Status: Abnormal   Collection Time    07/22/12  5:08 PM      Result Value Range   Glucose-Capillary 124 (*) 70 - 99 mg/dL  POCT I-STAT, CHEM 8     Status: Abnormal   Collection Time    07/22/12  5:10 PM      Result Value Range   Sodium 137  135 - 145 mEq/L   Potassium 4.0  3.5 - 5.1 mEq/L   Chloride 99  96 - 112 mEq/L   BUN 13  6 - 23 mg/dL   Creatinine, Ser 1.30  0.50 - 1.10 mg/dL   Glucose, Bld 865 (*) 70 - 99 mg/dL   Calcium, Ion 7.84 (*) 1.13 - 1.30 mmol/L   TCO2 25  0 - 100 mmol/L   Hemoglobin 8.5 (*) 12.0 - 15.0 g/dL   HCT 69.6 (*) 29.5 - 28.4 %  GLUCOSE, CAPILLARY     Status: Abnormal   Collection Time    07/22/12  7:52 PM      Result Value Range   Glucose-Capillary 104 (*) 70 - 99 mg/dL   Comment 1 Documented in Chart     Comment 2 Notify RN    GLUCOSE, CAPILLARY     Status: Abnormal   Collection Time    07/22/12 11:08 PM      Result Value Range   Glucose-Capillary 160 (*) 70 - 99 mg/dL   Comment 1 Documented in Chart     Comment 2 Notify RN    GLUCOSE, CAPILLARY     Status: Abnormal   Collection Time    07/23/12  5:11 AM      Result Value Range   Glucose-Capillary 101 (*) 70 - 99 mg/dL   Comment 1 Documented in Chart  Comment 2 Notify RN    BASIC METABOLIC PANEL     Status: Abnormal    Collection Time    07/23/12  5:55 AM      Result Value Range   Sodium 135  135 - 145 mEq/L   Potassium 4.3  3.5 - 5.1 mEq/L   Chloride 100  96 - 112 mEq/L   CO2 23  19 - 32 mEq/L   Glucose, Bld 109 (*) 70 - 99 mg/dL   BUN 19  6 - 23 mg/dL   Creatinine, Ser 1.61  0.50 - 1.10 mg/dL   Calcium 8.3 (*) 8.4 - 10.5 mg/dL   GFR calc non Af Amer >90  >90 mL/min   GFR calc Af Amer >90  >90 mL/min   Comment:            The eGFR has been calculated     using the CKD EPI equation.     This calculation has not been     validated in all clinical     situations.     eGFR's persistently     <90 mL/min signify     possible Chronic Kidney Disease.  CBC     Status: Abnormal   Collection Time    07/23/12  5:55 AM      Result Value Range   WBC 16.6 (*) 4.0 - 10.5 K/uL   RBC 3.05 (*) 3.87 - 5.11 MIL/uL   Hemoglobin 9.1 (*) 12.0 - 15.0 g/dL   HCT 09.6 (*) 04.5 - 40.9 %   MCV 87.2  78.0 - 100.0 fL   MCH 29.8  26.0 - 34.0 pg   MCHC 34.2  30.0 - 36.0 g/dL   RDW 81.1  91.4 - 78.2 %   Platelets 206  150 - 400 K/uL  GLUCOSE, CAPILLARY     Status: None   Collection Time    07/23/12  7:07 AM      Result Value Range   Glucose-Capillary 97  70 - 99 mg/dL  GLUCOSE, CAPILLARY     Status: Abnormal   Collection Time    07/23/12 11:27 AM      Result Value Range   Glucose-Capillary 100 (*) 70 - 99 mg/dL   Comment 1 Notify RN    GLUCOSE, CAPILLARY     Status: Abnormal   Collection Time    07/23/12  4:23 PM      Result Value Range   Glucose-Capillary 125 (*) 70 - 99 mg/dL   Comment 1 Notify RN     Comment 2 Documented in Chart    GLUCOSE, CAPILLARY     Status: Abnormal   Collection Time    07/23/12  9:15 PM      Result Value Range   Glucose-Capillary 117 (*) 70 - 99 mg/dL  CBC     Status: Abnormal   Collection Time    07/24/12  4:00 AM      Result Value Range   WBC 14.1 (*) 4.0 - 10.5 K/uL   RBC 2.65 (*) 3.87 - 5.11 MIL/uL   Hemoglobin 8.0 (*) 12.0 - 15.0 g/dL   HCT 95.6 (*) 21.3 - 08.6 %    MCV 89.1  78.0 - 100.0 fL   MCH 30.2  26.0 - 34.0 pg   MCHC 33.9  30.0 - 36.0 g/dL   RDW 57.8  46.9 - 62.9 %   Platelets 265  150 - 400 K/uL  BASIC METABOLIC PANEL     Status:  Abnormal   Collection Time    07/24/12  4:00 AM      Result Value Range   Sodium 136  135 - 145 mEq/L   Potassium 4.0  3.5 - 5.1 mEq/L   Chloride 100  96 - 112 mEq/L   CO2 29  19 - 32 mEq/L   Glucose, Bld 102 (*) 70 - 99 mg/dL   BUN 20  6 - 23 mg/dL   Creatinine, Ser 1.61  0.50 - 1.10 mg/dL   Calcium 8.0 (*) 8.4 - 10.5 mg/dL   GFR calc non Af Amer >90  >90 mL/min   GFR calc Af Amer >90  >90 mL/min   Comment:            The eGFR has been calculated     using the CKD EPI equation.     This calculation has not been     validated in all clinical     situations.     eGFR's persistently     <90 mL/min signify     possible Chronic Kidney Disease.   Dg Chest Port 1 View  07/23/2012   *RADIOLOGY REPORT*  Clinical Data: Status post CABG.  PORTABLE CHEST - 1 VIEW  Comparison: Plain film chest 07/22/2012.  Findings: Swan-Ganz catheter has been removed with a right IJ sheath remaining in place.  Left chest tube is again identified. There is a tiny left apical pneumothorax.  Small bilateral pleural effusions, larger on the right, have increased and there is some associated basilar atelectasis.  IMPRESSION:  1.  Tiny left apical pneumothorax with a left chest tube in place. 2.  Some increase in small bilateral pleural effusions and basilar atelectasis, greater on the right.   Original Report Authenticated By: Holley Dexter, M.D.    Post Admission Physician Evaluation: 1. Functional deficits secondary  to right TKA with intra-operative cardiac arrest, s/p CABG x 4 on 07/21/12. 2. Patient is admitted to receive collaborative, interdisciplinary care between the physiatrist, rehab nursing staff, and therapy team. 3. Patient's level of medical complexity and substantial therapy needs in context of that medical necessity  cannot be provided at a lesser intensity of care such as a SNF. 4. Patient has experienced substantial functional loss from his/her baseline which was documented above under the "Functional History" and "Functional Status" headings.  Judging by the patient's diagnosis, physical exam, and functional history, the patient has potential for functional progress which will result in measurable gains while on inpatient rehab.  These gains will be of substantial and practical use upon discharge  in facilitating mobility and self-care at the household level. 5. Physiatrist will provide 24 hour management of medical needs as well as oversight of the therapy plan/treatment and provide guidance as appropriate regarding the interaction of the two. 6. 24 hour rehab nursing will assist with bladder management, bowel management, safety, skin/wound care, disease management, medication administration, pain management and patient education  and help integrate therapy concepts, techniques,education, etc. 7. PT will assess and treat for/with: Lower extremity strength, range of motion, stamina, balance, functional mobility, safety, adaptive techniques and equipment, post-operative precautions, knee ROM, pain mgt, education.   Goals are: mod I to supervision. 8. OT will assess and treat for/with: ADL's, functional mobility, safety, upper extremity strength, adaptive techniques and equipment, post-op precautions, pain mgt, education.   Goals are: mod I to supervision. 9. SLP will assess and treat for/with: n/a.  Goals are: n/a. 10. Case Management and Social Worker will assess and  treat for psychological issues and discharge planning. 11. Team conference will be held weekly to assess progress toward goals and to determine barriers to discharge. 12. Patient will receive at least 3 hours of therapy per day at least 5 days per week. 13. ELOS: 8-14 days       14. Prognosis:  excellent   Medical Problem List and Plan: 1. Right  total knee arthroplasty for degenerative joint disease with intraoperative cardiac arrest/status post CABG x4 07/21/2012 2. DVT Prophylaxis/Anticoagulation: Subcutaneous Lovenox. Monitor platelet counts any signs of bleeding. Discussed checking vascular Dopplers 3. Pain Management: Oxycodone as needed. Monitor with increased mobility 4. Neuropsych: This patient is capable of making decisions on her own behalf. 5. Acute blood loss anemia. Followup CBC/continue Niferex 6. Hypertension. Lasix 40 mg daily, lisinopril 2.5 mg daily, Lopressor 12.5 mg twice a day. Monitor with increased mobility 7. Hyperlipidemia. Crestor   Ranelle Oyster, MD, [facility] Physical Medicine & Rehabilitation  07/24/2012

## 2012-07-24 NOTE — Progress Notes (Signed)
Nutrition Brief Note  Malnutrition Screening Tool result is inaccurate.  Please consult if nutrition needs are identified.  Waylyn Tenbrink Kowalski RD, LDN Pager #[phone] After Hours pager #[phone]   

## 2012-07-25 ENCOUNTER — Inpatient Hospital Stay (HOSPITAL_COMMUNITY): Payer: Medicare Other | Admitting: Physical Therapy

## 2012-07-25 ENCOUNTER — Inpatient Hospital Stay (HOSPITAL_COMMUNITY): Payer: Medicare Other | Admitting: *Deleted

## 2012-07-25 DIAGNOSIS — I80299 Phlebitis and thrombophlebitis of other deep vessels of unspecified lower extremity: Secondary | ICD-10-CM

## 2012-07-25 DIAGNOSIS — IMO0002 Reserved for concepts with insufficient information to code with codable children: Secondary | ICD-10-CM

## 2012-07-25 DIAGNOSIS — R609 Edema, unspecified: Secondary | ICD-10-CM

## 2012-07-25 DIAGNOSIS — Z96659 Presence of unspecified artificial knee joint: Secondary | ICD-10-CM

## 2012-07-25 DIAGNOSIS — M1710 Unilateral primary osteoarthritis, unspecified knee: Secondary | ICD-10-CM

## 2012-07-25 DIAGNOSIS — I2510 Atherosclerotic heart disease of native coronary artery without angina pectoris: Secondary | ICD-10-CM

## 2012-07-25 LAB — CBC
HCT: 26.3 % — ABNORMAL LOW (ref 36.0–46.0)
Hemoglobin: 8.6 g/dL — ABNORMAL LOW (ref 12.0–15.0)
MCV: 89.8 fL (ref 78.0–100.0)
Platelets: 379 10*3/uL (ref 150–400)
RBC: 2.93 MIL/uL — ABNORMAL LOW (ref 3.87–5.11)
WBC: 13.8 10*3/uL — ABNORMAL HIGH (ref 4.0–10.5)

## 2012-07-25 LAB — CREATININE, SERUM: GFR calc Af Amer: >90 mL/min (ref >90)

## 2012-07-25 MED ORDER — WARFARIN SODIUM 5 MG PO TABS
5.0000 mg | ORAL_TABLET | Freq: Once | ORAL | Status: AC
  Administered 2012-07-25: 5 mg via ORAL
  Filled 2012-07-25: qty 1

## 2012-07-25 MED ORDER — WARFARIN VIDEO
Freq: Once | Status: AC
  Administered 2012-07-25: 13:00:00

## 2012-07-25 MED ORDER — COUMADIN BOOK
Freq: Once | Status: AC
  Administered 2012-07-25: 13:00:00
  Filled 2012-07-25 (×2): qty 1

## 2012-07-25 MED ORDER — ENOXAPARIN SODIUM 80 MG/0.8ML ~~LOC~~ SOLN
70.0000 mg | Freq: Once | SUBCUTANEOUS | Status: AC
  Administered 2012-07-25: 70 mg via SUBCUTANEOUS
  Filled 2012-07-25: qty 0.8

## 2012-07-25 MED ORDER — GUAIFENESIN 100 MG/5ML PO SOLN
5.0000 mL | ORAL | Status: DC | PRN
  Filled 2012-07-25: qty 5

## 2012-07-25 MED ORDER — ENOXAPARIN SODIUM 80 MG/0.8ML ~~LOC~~ SOLN
70.0000 mg | Freq: Two times a day (BID) | SUBCUTANEOUS | Status: DC
  Administered 2012-07-26 – 2012-07-30 (×9): 70 mg via SUBCUTANEOUS
  Filled 2012-07-25 (×11): qty 0.8

## 2012-07-25 MED ORDER — WARFARIN - PHARMACIST DOSING INPATIENT
Freq: Every day | Status: DC
  Administered 2012-07-25 – 2012-07-26 (×2)

## 2012-07-25 NOTE — Progress Notes (Signed)
1150 Spoke with Dr. Riley Kill regarding + Doppler (DVT).  MD okay to resume therapy as scheduled.  Lovenox and Coumadin ordered per Pharmacy protocol.

## 2012-07-25 NOTE — Progress Notes (Signed)
*  Preliminary Results* Bilateral lower extremity venous duplex completed. The right lower extremity is positive for deep vein thrombosis involving the right posterior tibial and peroneal veins. There is also a right Baker's cyst. There is no evidence of left lower extremity deep vein thrombosis.  During the exam the patient was short of breath. The patient described that she has been short of breath for "a few days" and can't remember if the shortness of breath coincides with the first time she became ambulatory post-op.   Preliminary results discussed, as well as patient encounter, discussed with Judeth Cornfield, RN.  07/25/2012  Gertie Fey, RVT, RDCS, RDMS

## 2012-07-25 NOTE — Evaluation (Signed)
Occupational Therapy Assessment and Plan  Patient Details  Name: Susan Welch MRN: [id number] Date of Birth: 12-17-1945  OT Diagnosis: muscle weakness (generalized) Rehab Potential:  good ELOS:   10-12 days  Today's Date: 07/25/2012 Time:  - 0900-1015  (75 min)    Problem List:  Patient Active Problem List   Diagnosis Date Noted  . Osteoarthritis of right knee 07/16/2012  . HTN (hypertension) 09/20/2011  . Lipid disorder 09/20/2011    Past Medical History:  Past Medical History  Diagnosis Date  . Arthritis     a. bilat knees s/p L TKA 07/15/2012  . Anemia   . Hypertension   . Anxiety     Hx: of situational anxiety  . Headache(784.0)     Hx: of migraines  . Hyperlipidemia   . Panic attacks   . Ventricular fibrillation     a. 07/15/2012 in setting of R TKA   Past Surgical History:  Past Surgical History  Procedure Laterality Date  . Abdominal hysterectomy    . Dilation and curettage of uterus    . Total knee arthroplasty Right 07/15/2012    Procedure: TOTAL KNEE ARTHROPLASTY;  Surgeon: Loreta Ave, MD;  Location: [facility] OR;  Service: Orthopedics;  Laterality: Right;  drapes pulled back to provide cpr, new drape applied, protocol followed  . Coronary artery bypass graft N/A 07/21/2012    Procedure: CORONARY ARTERY BYPASS GRAFTING (CABG);  Surgeon: Delight Ovens, MD;  Location: [facility] OR;  Service: Open Heart Surgery;  Laterality: N/A;  Times 4 using left internal mammary artery and endoscopically harvested left saphenous vein.  . Intraoperative transesophageal echocardiogram N/A 07/21/2012    Procedure: INTRAOPERATIVE TRANSESOPHAGEAL ECHOCARDIOGRAM;  Surgeon: Delight Ovens, MD;  Location: [facility] OR;  Service: Open Heart Surgery;  Laterality: N/A;    Assessment & Plan Clinical Impression: Susan Welch is a 67 y.o. right-handed female with history of hypertension. Patient independent prior to admission working full time. Admitted 07/14/2012 with end-stage degenerative changes  right knee and underwent total knee replacement 07/15/2012 per Dr. Eulah Pont. Patient with intraoperative cardiac arrest and was resuscitated with cardiology services consulted. EKG showing inferior wall myocardial infarction. Cardiac catheterization showed severe three-vessel coronary artery disease with total occlusion of right coronary artery. Underwent CABG x4 07/21/2012 per Dr. Tyrone Sage. Patient maintained on subcutaneous Lovenox for DVT prophylaxis. Hospital course anemia 8.0 and monitored. Physical and occupational therapy evaluations completed 07/22/2012 patient is weightbearing as tolerated right lower extremity after total knee replacement with sternal precautions. Recommendations have been made for physical medicine rehabilitation consult to consider inpatient rehabilitation services. Patient was felt to be a good candidate for inpatient rehabilitation services and was admitted for comprehensive rehabilitation program .  Patient transferred to CIR on 07/24/2012 .    Patient currently requires mod with basic self-care skills secondary to muscle weakness Prior to hospitalization, patient could complete BADL with no assist.    Patient will benefit from skilled intervention to increase independence with basic self-care skills and increase level of independence with iADL prior to discharge home with care partner.  Anticipate patient will require intermittent supervision and follow up home health.    Skilled Therapeutic Intervention 2nd session:   1300-1345   (45 min)  Pain:  Chest from surgical site 3/10 and back     Individual session Addressed bed mobility, functional mobility, transfers.  Pt. Just completed toileting upon OT arrival.  She ambulated to bathroom with RW and minimal assist to bathroom.  Pt was supervision to  get OOB but minimal assist to get back to bed/  Ambulated with minimal assist from wc to tub bench with minimal to supervision to get in/out of tub .  Pt was very pleased with her  progress.   OT Evaluation Precautions/Restrictions  Precautions Precautions: Knee;Sternal Precaution Comments: educated patient on sternal and knee precautions, will continue to enforce Restrictions Weight Bearing Restrictions: Yes    Vital Signs :  Pt is on 2 liters oxygen, but difficult to get reading.  Got one reading of 90 % oxy sat after shower.     Pain Pain Assessment Pain Score: 2  Faces Pain Scale: Hurts a little bit Home Living/Prior Functioning Home Living Available Help at Discharge: Family;Available PRN/intermittently Type of Home: Apartment Home Access: Level entry Home Layout: One level  Lives With: Alone IADL History Homemaking Responsibilities: Yes Meal Prep Responsibility: Primary Laundry Responsibility: Primary Cleaning Responsibility: Primary Bill Paying/Finance Responsibility: Primary Shopping Responsibility: Secondary Homemaking Comments:  (Fam/ friends  to assist with house duties prn) Occupation: Full time employment Type of Occupation:  (sits at desk for job) Leisure and Hobbies: crochet, reading Prior Function Level of Independence: Independent with basic ADLs;Independent with homemaking with ambulation  Able to Take Stairs?: Yes Driving: Yes Vocation: Full time employment Vocation Requirements: sits at desk all day long Leisure: Hobbies-yes (Comment) ADL ADL Grooming: Setup Where Assessed-Grooming: Sitting at sink Upper Body Bathing: Setup Where Assessed-Upper Body Bathing: Shower Lower Body Bathing: Moderate assistance Where Assessed-Lower Body Bathing: Shower Upper Body Dressing: Setup Where Assessed-Upper Body Dressing: Chair Lower Body Dressing: Minimal assistance Where Assessed-Lower Body Dressing: Wheelchair Toileting: Minimal assistance Where Assessed-Toileting: Teacher, adult education: Moderate assistance Toilet Transfer Method: Stand pivot Tub/Shower Equipment: Restaurant manager, fast food: Moderate  assistance Film/video editor Method: Warden/ranger: Emergency planning/management officer Vision/Perception     Cognition   Sensation: see navigator    Motor :  See navigator   Mobility :  See navigator    Trunk/Postural Assessment see navigator    Balance:  See navigator   Extremity/Trunk Assessment:  See navigator      FIM:      Refer to Care Plan for Long Term Goals  Recommendations for other services: None  Discharge Criteria: Patient will be discharged from OT if patient refuses treatment 3 consecutive times without medical reason, if treatment goals not met, if there is a change in medical status, if patient makes no progress towards goals or if patient is discharged from hospital.  The above assessment, treatment plan, treatment alternatives and goals were discussed and mutually agreed upon: by patient  Humberto Seals 07/25/2012, 10:32 AM

## 2012-07-25 NOTE — Evaluation (Signed)
Physical Therapy Assessment and Plan  Patient Details  Name: Susan Welch MRN: [id number] Date of Birth: [birth year]  PT Diagnosis: Difficulty walking, Muscle weakness and Pain in joint Rehab Potential: Good ELOS: 10-12 days   Today's Date: 07/25/2012 Time: 1100-1200 Time Calculation (min): 60 min  Problem List:  Patient Active Problem List   Diagnosis Date Noted  . Osteoarthritis of right knee 07/16/2012  . HTN (hypertension) 09/20/2011  . Lipid disorder 09/20/2011    Past Medical History:  Past Medical History  Diagnosis Date  . Arthritis     a. bilat knees s/p L TKA 07/15/2012  . Anemia   . Hypertension   . Anxiety     Hx: of situational anxiety  . Headache(784.0)     Hx: of migraines  . Hyperlipidemia   . Panic attacks   . Ventricular fibrillation     a. 07/15/2012 in setting of R TKA   Past Surgical History:  Past Surgical History  Procedure Laterality Date  . Abdominal hysterectomy    . Dilation and curettage of uterus    . Total knee arthroplasty Right 07/15/2012    Procedure: TOTAL KNEE ARTHROPLASTY;  Surgeon: Loreta Ave, MD;  Location: [facility] OR;  Service: Orthopedics;  Laterality: Right;  drapes pulled back to provide cpr, new drape applied, protocol followed  . Coronary artery bypass graft N/A 07/21/2012    Procedure: CORONARY ARTERY BYPASS GRAFTING (CABG);  Surgeon: Delight Ovens, MD;  Location: [facility] OR;  Service: Open Heart Surgery;  Laterality: N/A;  Times 4 using left internal mammary artery and endoscopically harvested left saphenous vein.  . Intraoperative transesophageal echocardiogram N/A 07/21/2012    Procedure: INTRAOPERATIVE TRANSESOPHAGEAL ECHOCARDIOGRAM;  Surgeon: Delight Ovens, MD;  Location: [facility] OR;  Service: Open Heart Surgery;  Laterality: N/A;    Assessment & Plan Clinical Impression:Susan Welch is a 67 y.o. right-handed female with history of hypertension. Patient independent prior to admission working full time. Admitted 07/14/2012  with end-stage degenerative changes right knee and underwent total knee replacement 07/15/2012 per Dr. Eulah Pont. Patient with intraoperative cardiac arrest and was resuscitated with cardiology services consulted. EKG showing inferior wall myocardial infarction. Cardiac catheterization showed severe three-vessel coronary artery disease with total occlusion of right coronary artery. Underwent CABG x4 07/21/2012 per Dr. Tyrone Sage. Patient maintained on subcutaneous Lovenox for DVT prophylaxis. Hospital course anemia 8.0 and monitored. Physical and occupational therapy evaluations completed 07/22/2012 patient is weightbearing as tolerated right lower extremity after total knee replacement with sternal precautions. Recommendations have been made for physical medicine rehabilitation consult to consider inpatient rehabilitation services.    Patient transferred to CIR on 07/24/2012 .   Patient currently requires min with mobility secondary to muscle weakness and muscle joint tightness.  Prior to hospitalization, patient was independent  with mobility and lived with Alone in a Apartment home.  Home access is  Level entry.  Patient will benefit from skilled PT intervention to maximize safe functional mobility and minimize fall risk for planned discharge home alone.  Anticipate patient will benefit from follow up HH at discharge.  PT - End of Session Activity Tolerance: Tolerates 30+ min activity with multiple rests Endurance Deficit: Yes Endurance Deficit Description: gets SOB/DOE and likes to use supplemental O2 although O2 sats> 94% PT Assessment Rehab Potential: Good Barriers to Discharge: Decreased caregiver support PT Plan PT Intensity: Minimum of 1-2 x/day ,45 to 90 minutes PT Frequency: 5 out of 7 days PT Duration Estimated Length of Stay: 10-12 days PT Treatment/Interventions:  Ambulation/gait training;Balance/vestibular training;DME/adaptive equipment instruction;Functional mobility  training;Patient/family education;Therapeutic Activities;Therapeutic Exercise;UE/LE Strength taining/ROM PT Recommendation Follow Up Recommendations: Home health PT Patient destination: Home  Skilled Therapeutic Intervention   PT Evaluation Precautions/Restrictions Precautions Precautions: Knee;Sternal;Fall (DVT R calf) Precaution Comments: educated patient on sternal and knee precautions, will continue to enforce Restrictions Weight Bearing Restrictions: Yes (B UE sternal precautions) RUE Weight Bearing: Non weight bearing LUE Weight Bearing: Non weight bearing RLE Weight Bearing: Weight bearing as tolerated General Chart Reviewed: Yes Additional Pertinent History: Ultrasound + this a.m. for DVT R calf, will await MD notification and restrictions/orders Family/Caregiver Present: No  Vital SignsTherapy Vitals Temp: 97.6 F (36.4 C) Temp src: Oral Pulse Rate: 96 Resp: 18 BP: 126/76 mmHg Patient Position, if appropriate: Sitting Oxygen Therapy SpO2: 98 % O2 Device: Nasal cannula O2 Flow Rate (L/min): 2 L/min Pain Pain Assessment Pain Assessment: 0-10 Pain Score: 7  (coughing = 7/10 briefly goes away and 0/10 at rest) Faces Pain Scale: Hurts a little bit Pain Type: Surgical pain Pain Location: Chest Pain Orientation: Anterior Pain Descriptors / Indicators: Aching;Sharp Pain Onset: Gradual Patients Stated Pain Goal: 2 Pain Intervention(s): Medication (See eMAR);Repositioned Multiple Pain Sites: Yes (R knee with weight bearing, 2-6/10) Home Living/Prior Functioning Home Living Available Help at Discharge: Family;Available PRN/intermittently Type of Home: Apartment Home Access: Level entry Home Layout: One level  Lives With: Alone Prior Function Level of Independence: Independent with basic ADLs;Independent with gait  Able to Take Stairs?: Yes Driving: Yes Vocation: Full time employment Vision/Perception  Vision - History Baseline Vision: Wears glasses all the  time Patient Visual Report: No change from baseline Vision - Assessment Eye Alignment: Within Functional Limits  Cognition Overall Cognitive Status: Within Functional Limits for tasks assessed Orientation Level: Oriented X4 Sensation Sensation Light Touch: Appears Intact Motor  Motor Motor: Within Functional Limits  Mobility Bed Mobility Bed Mobility: Rolling Right;Right Sidelying to Sit;Sitting - Scoot to [place] of Bed;Sit to Sidelying Right;Scooting to [facility] Rolling Right: 4: Min guard Right Sidelying to Sit: 4: Min assist Sitting - Scoot to Edge of Bed: 4: Min guard Sit to Sidelying Right: 4: Min assist Scooting to [facility]: 2: Max assist Transfers Sit to Stand: 4: Min guard Stand to Sit: 4: Min guard Stand Pivot Transfers: 4: Min assist Locomotion  Ambulation Ambulation: Yes Ambulation/Gait Assistance: 4: Min assist Ambulation Distance (Feet): 40 Feet Assistive device: Rolling walker Gait Gait: Yes Gait Pattern: Decreased step length - right;Decreased step length - left;Decreased hip/knee flexion - right;Decreased hip/knee flexion - left Stairs / Additional Locomotion Stairs: No Wheelchair Mobility Wheelchair Mobility: No  Trunk/Postural Assessment  Cervical Assessment Cervical Assessment: Within Functional Limits Thoracic Assessment Thoracic Assessment: Within Functional Limits Lumbar Assessment Lumbar Assessment: Within Functional Limits Postural Control Postural Control: Within Functional Limits  Balance Balance Balance Assessed: Yes Static Sitting Balance Static Sitting - Balance Support: No upper extremity supported;Feet supported Static Sitting - Level of Assistance: 5: Stand by assistance Dynamic Sitting Balance Dynamic Sitting - Balance Support: No upper extremity supported;Feet supported Dynamic Sitting - Level of Assistance: 4: Min assist Static Standing Balance Static Standing - Balance Support: Bilateral upper extremity supported Static Standing -  Level of Assistance: 5: Stand by assistance Dynamic Standing Balance Dynamic Standing - Balance Support: Bilateral upper extremity supported Dynamic Standing - Level of Assistance: 4: Min assist Extremity Assessment  RLE Assessment RLE Assessment:  (MMT grossly 2/5 R knee ) RLE PROM (degrees) Right Knee Extension: 3 Right Knee Flexion: 80 LLE Assessment LLE Assessment: Within  Functional Limits  FIM:  FIM - Bed/Chair Transfer Bed/Chair Transfer: 4: Supine > Sit: Min A (steadying Pt. > 75%/lift 1 leg);4: Bed > Chair or W/C: Min A (steadying Pt. > 75%);4: Sit > Supine: Min A (steadying pt. > 75%/lift 1 leg);4: Chair or W/C > Bed: Min A (steadying Pt. > 75%) FIM - Locomotion: Wheelchair Locomotion: Wheelchair: 0: Activity did not occur FIM - Locomotion: Ambulation Locomotion: Ambulation Assistive Devices: Designer, industrial/product Ambulation/Gait Assistance: 4: Min assist Locomotion: Ambulation: 1: Travels less than 50 ft with minimal assistance (Pt.>75%) FIM - Locomotion: Stairs Locomotion: Stairs: 0: Activity did not occur   Refer to Care Plan for Long Term Goals  Recommendations for other services: None  Discharge Criteria: Patient will be discharged from PT if patient refuses treatment 3 consecutive times without medical reason, if treatment goals not met, if there is a change in medical status, if patient makes no progress towards goals or if patient is discharged from hospital.  The above assessment, treatment plan, treatment alternatives and goals were discussed and mutually agreed upon: by patient  Rex Kras 07/25/2012, 6:40 PM

## 2012-07-25 NOTE — Progress Notes (Signed)
Physical Therapy Session Note  Patient Details  Name: Susan Welch MRN: [id number] Date of Birth: April 12, 1945  Today's Date: 07/25/2012 Time: 1500-1530 Time Calculation (min): 30 min  Skilled Therapeutic Interventions/Progress Updates:  Ambulation/gait training;Balance/vestibular training;DME/adaptive equipment instruction;Functional mobility training;Patient/family education;Therapeutic Activities;Therapeutic Exercise;UE/LE Strength taining/ROM   Therapy Documentation Precautions:  Precautions:  Precautions: Knee;Sternal;Fall (DVT R calf, no restrictions) Precaution Comments: educated patient on sternal and knee precautions, will continue to enforce Restrictions Weight Bearing Restrictions: Yes (B UE sternal precautions) RUE Weight Bearing: Non weight bearing LUE Weight Bearing: Non weight bearing RLE Weight Bearing: Weight bearing as tolerated Other Position/Activity Restrictions: Sternal precautions Pain: 2-5/10 pain at sternum and at Right knee with movement  Therapeutic Activity:(15') Bed mobility and transfers min-guard assist. Patient using no UE to transfer sit<->stand. Toileting and transfers with min-A for pulling up pants when finished. Sitting EOB with gravity assisted knee flexion ROM.  Gait Training:(15') using RW 3 x 40' and maneuvering inside room with min-guard assist.    Therapy/Group: Individual Therapy  Rex Kras 07/25/2012, 6:48 PM

## 2012-07-25 NOTE — Progress Notes (Signed)
ANTICOAGULATION CONSULT NOTE - Initial Consult  Pharmacy Consult for Lovenox + Warfarin  Indication: New RLE DVT  Allergies  Allergen Reactions  . Statins Other (See Comments)    Joint pain  . Nickel Rash    Patient Measurements: Weight: 156 lb 8.4 oz (71 kg)  Vital Signs: Temp: 97.9 F (36.6 C) (07/19 0500) Temp src: Oral (07/19 0500) BP: 142/82 mmHg (07/19 0500) Pulse Rate: 93 (07/19 0500)  Labs:  Recent Labs  07/23/12 0555 07/24/12 0400 07/25/12 0500  HGB 9.1* 8.0* 8.6*  HCT 26.6* 23.6* 26.3*  PLT 206 265 379  CREATININE 0.58 0.55 0.61    The CrCl is unknown because both a height and weight (above a minimum accepted value) are required for this calculation.   Medical History: Past Medical History  Diagnosis Date  . Arthritis     a. bilat knees s/p L TKA 07/15/2012  . Anemia   . Hypertension   . Anxiety     Hx: of situational anxiety  . Headache(784.0)     Hx: of migraines  . Hyperlipidemia   . Panic attacks   . Ventricular fibrillation     a. 07/15/2012 in setting of R TKA    Assessment: 67 y.o. F s/p recent R-TKR on 7/9 and CABG x 4 on 07/21/12 and transferred to IR for rehabilitation on 7/18. Dopplers confirmed RLE DVT this morning and pharmacy was consulted to start a lovenox bridge to a therapeutic INR with warfarin. Wt: 71 kg, SCr 0.61, CrCl~70 ml/min, Warf points~#  The patient received a dose of lovenox 30 mg SQ for VTE prophylaxis at 2100 on 7/18. Will order warfarin education materials today with plans to educate the patient on warfarin prior to discharge.   Goal of Therapy:  INR 2-3 Anti-Xa level 0.6-1.2 units/ml 4hrs after LMWH dose given Monitor platelets by anticoagulation protocol: Yes   Plan:  1. Lovenox 70 mg SQ x 1 now then will start every 12 hours at 0500 on 7/20 (to avoid waking the patient up for the injection) 2. Warfarin 5 mg x 1 dose at 1800 today 3. Warfarin book/video 4. Will continue to monitor for any signs/symptoms of  bleeding and will follow up with PT/INR in the a.m.   Georgina Pillion, PharmD, BCPS Clinical Pharmacist Pager: [phone] 07/25/2012 12:52 PM

## 2012-07-25 NOTE — Progress Notes (Signed)
Subjective/Complaints: Complains of cough, some difficulty clearing secretions due to chest discomfort. In CPM since 0400. Ready for therapies today.  A 12 point review of systems has been performed and if not noted above is otherwise negative.   Objective: Vital Signs: Blood pressure 142/82, pulse 93, temperature 97.9 F (36.6 C), temperature source Oral, resp. rate 18, weight 71 kg (156 lb 8.4 oz), SpO2 97.00%. No results found.  Recent Labs  07/24/12 0400 07/25/12 0500  WBC 14.1* 13.8*  HGB 8.0* 8.6*  HCT 23.6* 26.3*  PLT 265 379    Recent Labs  07/23/12 0555 07/24/12 0400 07/25/12 0500  NA 135 136  --   K 4.3 4.0  --   CL 100 100  --   GLUCOSE 109* 102*  --   BUN 19 20  --   CREATININE 0.58 0.55 0.61  CALCIUM 8.3* 8.0*  --    CBG (last 3)   Recent Labs  07/24/12 0639 07/24/12 1126 07/24/12 1608  GLUCAP 100* 120* 107*    Wt Readings from Last 3 Encounters:  07/24/12 71 kg (156 lb 8.4 oz)  07/24/12 75.388 kg (166 lb 3.2 oz)  07/24/12 75.388 kg (166 lb 3.2 oz)    Physical Exam:  Constitutional: She is oriented to person, place, and time.  HENT: oral mucosa pink and moist, dentition good  Head: Normocephalic.  Eyes: EOM are normal.  Neck: Normal range of motion. Neck supple. No thyromegaly present.  Cardiovascular:  Cardiac rate control With reg rhythm, no murmurs  Pulmonary/Chest: Breath sounds normal. No respiratory distress. No wheezes, rales. Limited expansion due to chest discomfort  Abdominal: Soft. Bowel sounds are normal. She exhibits no distension.  Neurological: She is alert and oriented to person, place, and time.  Follows full commands. Cognitively within normal limits. CN 2-12 normal. Sensory exam intact. Motor 4+/5 bilateral UE's. LLE 3-4 hf to 4/5 at ankle. RLE is 2/5 HF, 1+ KE, 4/5 at ankle.  Musc: knee rom 70 deg+ flexion Skin:  Midline chest incision healing. Right knee incision with staples intact, no drainage. LLE with multiple  ecchymoses and incisions from harvest sites.  Psychiatric: She has a normal mood and affect   Assessment/Plan: 1. Functional deficits secondary to right TKA with intraoperative cardiac arrest/ subsequent CABG which require 3+ hours per day of interdisciplinary therapy in a comprehensive inpatient rehab setting. Physiatrist is providing close team supervision and 24 hour management of active medical problems listed below. Physiatrist and rehab team continue to assess barriers to discharge/monitor patient progress toward functional and medical goals. FIM:                                  Medical Problem List and Plan:  1. Right total knee arthroplasty for degenerative joint disease with intraoperative cardiac arrest/status post CABG x4 07/21/2012  2. DVT Prophylaxis/Anticoagulation: Subcutaneous Lovenox. Monitor platelet counts any signs of bleeding. Check venous dopplers monday 3. Pain Management: Oxycodone as needed. Monitor with increased mobility  4. Neuropsych: This patient is capable of making decisions on her own behalf.  5. Acute blood loss anemia. Followup CBC/continue Niferex  6. Hypertension. Lasix 40 mg daily, lisinopril 2.5 mg daily, Lopressor 12.5 mg twice a day. Monitor with increased mobility  7. Hyperlipidemia. Crestor 8. Cough--robitussin, IS, OOB  LOS (Days) 1 A FACE TO FACE EVALUATION WAS PERFORMED  Caron Tardif T 07/25/2012 9:13 AM

## 2012-07-26 ENCOUNTER — Inpatient Hospital Stay (HOSPITAL_COMMUNITY): Payer: Medicare Other | Admitting: Physical Therapy

## 2012-07-26 DIAGNOSIS — Z96659 Presence of unspecified artificial knee joint: Secondary | ICD-10-CM

## 2012-07-26 HISTORY — DX: Presence of unspecified artificial knee joint: Z96.659

## 2012-07-26 LAB — PROTIME-INR
INR: 1.1 (ref 0.00–1.49)
Prothrombin Time: 14 seconds (ref 11.6–15.2)

## 2012-07-26 MED ORDER — WARFARIN SODIUM 5 MG PO TABS
5.0000 mg | ORAL_TABLET | Freq: Once | ORAL | Status: AC
  Administered 2012-07-26: 5 mg via ORAL
  Filled 2012-07-26: qty 1

## 2012-07-26 NOTE — Progress Notes (Signed)
Subjective/Complaints: Had a good day. Happy to be up and about. Feels more "like herself"  A 12 point review of systems has been performed and if not noted above is otherwise negative.   Objective: Vital Signs: Blood pressure 134/77, pulse 99, temperature 98 F (36.7 C), temperature source Oral, resp. rate 18, weight 71 kg (156 lb 8.4 oz), SpO2 97.00%. No results found.  Recent Labs  07/24/12 0400 07/25/12 0500  WBC 14.1* 13.8*  HGB 8.0* 8.6*  HCT 23.6* 26.3*  PLT 265 379    Recent Labs  07/24/12 0400 07/25/12 0500  NA 136  --   K 4.0  --   CL 100  --   GLUCOSE 102*  --   BUN 20  --   CREATININE 0.55 0.61  CALCIUM 8.0*  --    CBG (last 3)   Recent Labs  07/24/12 0639 07/24/12 1126 07/24/12 1608  GLUCAP 100* 120* 107*    Wt Readings from Last 3 Encounters:  07/24/12 71 kg (156 lb 8.4 oz)  07/24/12 75.388 kg (166 lb 3.2 oz)  07/24/12 75.388 kg (166 lb 3.2 oz)    Physical Exam:  Constitutional: She is oriented to person, place, and time.  HENT: oral mucosa pink and moist, dentition good  Head: Normocephalic.  Eyes: EOM are normal.  Neck: Normal range of motion. Neck supple. No thyromegaly present.  Cardiovascular:  Cardiac rate control With reg rhythm, no murmurs  Pulmonary/Chest: Breath sounds normal. No respiratory distress. No wheezes, rales. Limited expansion due to chest discomfort  Abdominal: Soft. Bowel sounds are normal. She exhibits no distension.  Neurological: She is alert and oriented to person, place, and time.  Follows full commands. Cognitively within normal limits. CN 2-12 normal. Sensory exam intact. Motor 4+/5 bilateral UE's. LLE 3-4 hf to 4/5 at ankle. RLE is 2/5 HF, 1+ KE, 4/5 at ankle.  Musc: knee rom 70 deg+ flexion, 1+ edema about the knee Skin:  Midline chest incision healing. Right knee incision with staples intact, no drainage. LLE with multiple ecchymoses and incisions from harvest sites.  Psychiatric: She has a normal mood and  affect   Assessment/Plan: 1. Functional deficits secondary to right TKA with intraoperative cardiac arrest/ subsequent CABG which require 3+ hours per day of interdisciplinary therapy in a comprehensive inpatient rehab setting. Physiatrist is providing close team supervision and 24 hour management of active medical problems listed below. Physiatrist and rehab team continue to assess barriers to discharge/monitor patient progress toward functional and medical goals. FIM:       FIM - Toileting Toileting steps completed by patient: Adjust clothing prior to toileting;Performs perineal hygiene;Adjust clothing after toileting Toileting Assistive Devices: Grab bar or rail for support Toileting: 5: Supervision: Safety issues/verbal cues     FIM - Bed/Chair Transfer Bed/Chair Transfer: 4: Supine > Sit: Min A (steadying Pt. > 75%/lift 1 leg);4: Bed > Chair or W/C: Min A (steadying Pt. > 75%);4: Sit > Supine: Min A (steadying pt. > 75%/lift 1 leg);4: Chair or W/C > Bed: Min A (steadying Pt. > 75%)  FIM - Locomotion: Wheelchair Locomotion: Wheelchair: 0: Activity did not occur FIM - Locomotion: Ambulation Locomotion: Ambulation Assistive Devices: Designer, industrial/product Ambulation/Gait Assistance: 4: Min assist Locomotion: Ambulation: 1: Travels less than 50 ft with minimal assistance (Pt.>75%)  Comprehension Comprehension Mode: Auditory Comprehension: 7-Follows complex conversation/direction: With no assist  Expression Expression Mode: Verbal Expression: 7-Expresses complex ideas: With no assist  Social Interaction Social Interaction: 7-Interacts appropriately with others -  No medications needed.  Problem Solving Problem Solving: 7-Solves complex problems: Recognizes & self-corrects  Memory Memory: 7-Complete Independence: No helper  Medical Problem List and Plan:  1. Right total knee arthroplasty for degenerative joint disease with intraoperative cardiac arrest/status post CABG x4  07/21/2012  2. DVT Prophylaxis/Anticoagulation: -lovenox to coumadin per pharmacy (appreciate their help)  -mobilize as tolerated for post-tib and peroneal dvt 3. Pain Management: Oxycodone as needed. Monitor with increased mobility  4. Neuropsych: This patient is capable of making decisions on her own behalf.  5. Acute blood loss anemia. Followup CBC/continue Niferex  6. Hypertension. Lasix 40 mg daily, lisinopril 2.5 mg daily, Lopressor 12.5 mg twice a day. Normotensive at present 7. Hyperlipidemia. Crestor 8. Cough--robitussin, IS, OOB  LOS (Days) 2 A FACE TO FACE EVALUATION WAS PERFORMED  [place] Jon T 07/26/2012 8:15 AM

## 2012-07-26 NOTE — Progress Notes (Signed)
Physical Therapy Session Note  Patient Details  Name: Susan Welch MRN: [id number] Date of Birth: 10-Aug-1945  Today's Date: 07/26/2012 Time: 0803-0903 Time Calculation (min): 60 min  Short Term Goals: Week 1:     Skilled Therapeutic Interventions/Progress Updates:  Pt was seen bedside in the am. Pt transferred supine to edge of bed with head of bed elevated and S. Pt transferred sit to stand and then ambulated about 10 feet with rolling walker and S. Transported to gym, treatment focused on R LE strengthening and ROM, exercises included knee flex at edge of bed, LAQs, and quads sets 3 sets x 10 reps each, one set x 10 reps SLRs. Pt was able to performed transfers on and off mat and from edge of mat to supine, supine to edge of mat with S and verbal cues for technique. Pt followed all precautions throughout treatment without cueing.   Therapy Documentation Precautions:  Precautions Precautions: Knee;Sternal;Fall (DVT R calf) Precaution Comments: educated patient on sternal and knee precautions, will continue to enforce Restrictions Weight Bearing Restrictions: Yes RUE Weight Bearing: Non weight bearing LUE Weight Bearing: Non weight bearing RLE Weight Bearing: Weight bearing as tolerated Other Position/Activity Restrictions: Sternal precautions  Pain: Pt c/o discomfort but no pain prior to tx, nursing at bedside.   Locomotion : Ambulation Ambulation/Gait Assistance: 5: Supervision   See FIM for current functional status  Therapy/Group: Individual Therapy  Rayford Halsted 07/26/2012, 12:29 PM

## 2012-07-26 NOTE — Progress Notes (Addendum)
      [address]       [name] [address]             [phone]             Subjective: Patient making steady progress. Is tired today as she may have "over done it" yesterday.  Objective: Vital signs in last 24 hours: Temp:  [97.6 F (36.4 C)-98 F (36.7 C)] 98 F (36.7 C) (07/20 0520) Pulse Rate:  [96-99] 99 (07/20 0520) Cardiac Rhythm:  [-]  Resp:  [18] 18 (07/20 0520) BP: (126-134)/(76-77) 134/77 mmHg (07/20 0520) SpO2:  [97 %-99 %] 97 % (07/20 0520)  Pre op weight  70 kg Current Weight  07/24/12 71 kg (156 lb 8.4 oz)      Intake/Output from previous day: 07/19 0701 - 07/20 0700 In: 60 [P.O.:60] Out: -    Physical Exam:  Cardiovascular: RRR, no murmurs, gallops, or rubs. Pulmonary: Clear to auscultation bilaterally; no rales, wheezes, or rhonchi. Abdomen: Soft, non tender, bowel sounds present. Extremities: Mild bilateral lower extremity edema. Ecchymosis left thigh. Wounds: Clean and dry.  No erythema or signs of infection.  Lab Results: CBC: Recent Labs  07/24/12 0400 07/25/12 0500  WBC 14.1 13.8  HGB 8.0 8.6  HCT 23.6 26.3  PLT 265 379   BMET:  Recent Labs  07/24/12 0400 07/25/12 0500  NA 136  --   K 4.0  --   CL 100  --   CO2 29  --   GLUCOSE 102  --   BUN 20  --   CREATININE 0.55 0.61  CALCIUM 8.0  --     PT/INR:  Lab Results  Component Value Date   INR 1.63* 07/21/2012   INR 1.09 07/15/2012   INR 0.96 07/07/2012   ABG:  INR: Will add last result for INR, ABG once components are confirmed Will add last 4 CBG results once components are confirmed  Assessment/Plan:  1. CV - S/P NSTEMI. SR. On Lopressor 25 bid, Lisinopril 2.5 daily. As discussed with Dr. Dorris Fetch, ok to start Coumadin for DVT. 2.  Pulmonary - Encourage incentive spirometer 3. Volume Overload - On Lasix 40 daily 4.  Acute blood loss anemia - Last H and H stable at 8.6 and 26.3. Continue Niferex. 5.Sutures in right leg to remain for 10-14  days (from the time of surgery). 6.Management per Dr. Sabino Snipes MPA-C 07/26/2012,9:50 AM

## 2012-07-26 NOTE — Progress Notes (Signed)
ANTICOAGULATION CONSULT NOTE - Follow Up Consult  Pharmacy Consult for Warfarin Indication: RLE DVT  Allergies  Allergen Reactions  . Statins Other (See Comments)    Joint pain  . Nickel Rash    Patient Measurements: Weight: 156 lb 8.4 oz (71 kg)  Vital Signs: Temp: 98 F (36.7 C) (07/20 0520) Temp src: Oral (07/20 0520) BP: 134/77 mmHg (07/20 0520) Pulse Rate: 99 (07/20 0520)  Labs:  Recent Labs  07/24/12 0400 07/25/12 0500 07/26/12 0932  HGB 8.0* 8.6*  --   HCT 23.6* 26.3*  --   PLT 265 379  --   LABPROT  --   --  14.0  INR  --   --  1.10  CREATININE 0.55 0.61  --     The CrCl is unknown because both a height and weight (above a minimum accepted value) are required for this calculation.   Assessment: 67 y.o. F s/p recent R-TKR on 7/9 and CABG x 4 on 07/21/12 and transferred to IR for rehabilitation on 7/18. Dopplers confirmed RLE DVT on 7/19 and the patient is currently being bridged with lovenox to a therapeutic INR with warfarin. Today is VTE overlap D#2/5. INR today remains SUBtherapeutic (INR 1.1). No CBC today -- stable from 7/19 labs. No overt s/sx of bleeding noted.   The patient was educated on warfarin today.  Goal of Therapy:  INR 2-3 Anti-Xa level 0.6-1.2 units/ml 4hrs after LMWH dose given Monitor platelets by anticoagulation protocol: Yes   Plan:  1. Continue Lovenox 70 mg SQ every 12 hours 2. Warfarin 5 mg x 1 dose at 1800 today 3. Will continue to monitor for any signs/symptoms of bleeding and will follow up with PT/INR in the a.m.   Georgina Pillion, PharmD, BCPS Clinical Pharmacist Pager: [phone] 07/26/2012 10:42 AM

## 2012-07-26 NOTE — Plan of Care (Signed)
Overall Plan of Care [facility]) Patient Details Name: Susan Welch MRN: [id number] DOB: 10/05/1945  Diagnosis:  Left TKA  Co-morbidities: cardiac arrest ,cabg  Functional Problem List  Patient demonstrates impairments in the following areas: Edema, Medication Management, Nutrition, Pain and Safety  Basic ADL's: grooming Advanced ADL's: simple meal preparation  Transfers:  bed mobility, bed to chair, toilet, tub/shower, car, furniture and floor Locomotion:  ambulation  Additional Impairments:  Leisure Awareness  Anticipated Outcomes Item Anticipated Outcome  Eating/Swallowing  independent  Basic self-care  Mod I  Tolieting  Min assist  Bowel/Bladder  Min assist  Transfers  Mod I  Locomotion Mod I  Communication    Cognition    Pain  Min assist; Pain will be managed at 3 or less with prn medications  Safety/Judgment  Min assist  Other     Therapy Plan: PT Intensity: Minimum of 1-2 x/day ,45 to 90 minutes PT Frequency: 5 out of 7 days PT Duration Estimated Length of Stay: 10-12 days OT Intensity: Minimum of 1-2 x/day, 45 to 90 minutes OT Frequency: 5 out of 7 days OT Duration/Estimated Length of Stay: 10-12 days      Team Interventions: Item RN PT OT SLP SW TR Other  Self Care/Advanced ADL Retraining   x      Neuromuscular Re-Education         Therapeutic Activities  x x      UE/LE Strength Training/ROM  x x      UE/LE Coordination Activities  x x      Visual/Perceptual Remediation/Compensation         DME/Adaptive Equipment Instruction  x x      Therapeutic Exercise  x x      Balance/Vestibular Training         Patient/Family Education x        Cognitive Remediation/Compensation         Functional Mobility Training  xx x      Ambulation/Gait Training  x x      Psychologist, sport and exercise         Bladder Management         Bowel Management         Disease Management/Prevention x        Pain Management x x       Medication Management x        Skin Care/Wound Management x        Splinting/Orthotics         Discharge Planning x x x      Psychosocial Support x                               Team Discharge Planning: Destination: PT-Home ,OT-   , SLP-  Projected Follow-up: PT-Home health PT, OT-   , SLP-  Projected Equipment Needs: PT- , OT-  , SLP-  Patient/family involved in discharge planning: PT- Patient,  OT-Patient, SLP-   MD ELOS: 7 days Medical Rehab Prognosis:  Excellent Assessment: The patient has been admitted for CIR therapies. The team will be addressing, functional mobility, strength, stamina, balance, safety, adaptive techniques/equipment, self-care,  bowel and bladder mgt, patient and caregiver education, knee rom. Goals have been set at Cedric Fishman, MD, [facility]      See Team Conference Notes for weekly updates to the plan of care

## 2012-07-27 ENCOUNTER — Inpatient Hospital Stay (HOSPITAL_COMMUNITY): Payer: Medicare Other | Admitting: Occupational Therapy

## 2012-07-27 ENCOUNTER — Inpatient Hospital Stay (HOSPITAL_COMMUNITY): Payer: Medicare Other | Admitting: Physical Therapy

## 2012-07-27 LAB — COMPREHENSIVE METABOLIC PANEL
ALT: 51 U/L — ABNORMAL HIGH (ref 0–35)
AST: 40 U/L — ABNORMAL HIGH (ref 0–37)
Albumin: 2.7 g/dL — ABNORMAL LOW (ref 3.5–5.2)
CO2: 30 mEq/L (ref 19–32)
Calcium: 9.1 mg/dL (ref 8.4–10.5)
Creatinine, Ser: 0.67 mg/dL (ref 0.50–1.10)
Total Protein: 5.9 g/dL — ABNORMAL LOW (ref 6.0–8.3)

## 2012-07-27 LAB — CBC WITH DIFFERENTIAL/PLATELET
HCT: 28.6 % — ABNORMAL LOW (ref 36.0–46.0)
Hemoglobin: 9.4 g/dL — ABNORMAL LOW (ref 12.0–15.0)
Lymphs Abs: 1.8 10*3/uL (ref 0.7–4.0)
MCH: 29.9 pg (ref 26.0–34.0)
Monocytes Absolute: 1.2 10*3/uL — ABNORMAL HIGH (ref 0.1–1.0)
Monocytes Relative: 11 % (ref 3–12)
Neutro Abs: 6.7 10*3/uL (ref 1.7–7.7)
Neutrophils Relative %: 66 % (ref 43–77)
RBC: 3.14 MIL/uL — ABNORMAL LOW (ref 3.87–5.11)

## 2012-07-27 LAB — PROTIME-INR: INR: 1.14 (ref 0.00–1.49)

## 2012-07-27 MED ORDER — WARFARIN SODIUM 7.5 MG PO TABS
7.5000 mg | ORAL_TABLET | Freq: Once | ORAL | Status: AC
  Administered 2012-07-27: 7.5 mg via ORAL
  Filled 2012-07-27: qty 1

## 2012-07-27 NOTE — Progress Notes (Signed)
Subjective:     Patient reports pain as 3 on 0-10 scale.   No change in knee/ incision Feels PT is going well Objective: Vital signs in last 24 hours: Temp:  [97.4 F (36.3 C)-99 F (37.2 C)] 97.4 F (36.3 C) (07/21 0454) Pulse Rate:  [86-106] 91 (07/21 0454) Resp:  [19-20] 19 (07/21 0454) BP: (124-136)/(70-79) 124/79 mmHg (07/21 0454) SpO2:  [91 %] 91 % (07/21 0454) Weight:  [71.3 kg (157 lb 3 oz)] 71.3 kg (157 lb 3 oz) (07/21 0454)  Intake/Output from previous day: 07/20 0701 - 07/21 0700 In: 600 [P.O.:600] Out: -  Intake/Output this shift: Total I/O In: 240 [P.O.:240] Out: -    Recent Labs  07/25/12 0500 07/27/12 0605  HGB 8.6* 9.4*    Recent Labs  07/25/12 0500 07/27/12 0605  WBC 13.8* 10.2  RBC 2.93* 3.14*  HCT 26.3* 28.6*  PLT 379 460*    Recent Labs  07/25/12 0500 07/27/12 0605  NA  --  137  K  --  3.8  CL  --  98  CO2  --  30  BUN  --  18  CREATININE 0.61 0.67  GLUCOSE  --  99  CALCIUM  --  9.1    Recent Labs  07/26/12 0932 07/27/12 0605  INR 1.10 1.14    Dorsiflexion/Plantar flexion intact Incision: no drainage No cellulitis present Staples intact ROM at 78 degrees in CPM  Assessment/Plan:     Up with therapy Will recheck on Wednesday for possible staple removal  [name],[name] 07/27/2012, 11:59 AM

## 2012-07-27 NOTE — Progress Notes (Signed)
Occupational Therapy Session Note  Patient Details  Name: Susan Welch MRN: [id number] Date of Birth: 11/24/1945  Today's Date: 07/27/2012 Time: 0830-0930 Time Calculation (min): 60 min   Skilled Therapeutic Interventions/Progress Updates:    1:1 self care retraining at shower level with focus on maintaining sternal precautions with mobility, functional ambulation with RW around room to obtain items, activity tolerance with verbal cues to encouragement for deep breathing for maintaining O2 sats. Pt remained on RA and sats varied from 84-94%  Therapy Documentation Precautions:  Precautions Precautions: Knee;Sternal;Fall (DVT R calf) Precaution Comments: educated patient on sternal and knee precautions, will continue to enforce Restrictions Weight Bearing Restrictions: Yes RUE Weight Bearing: Non weight bearing LUE Weight Bearing: Non weight bearing RLE Weight Bearing: Weight bearing as tolerated Other Position/Activity Restrictions: Sternal precautions Pain: No c/o pain  See FIM for current functional status  Therapy/Group: Individual Therapy  Roney Mans [facility] 07/27/2012, 9:46 AM

## 2012-07-27 NOTE — Progress Notes (Signed)
Patient slept quietly throughout the night without complaint.  Currently awake and requesting a slice of pizza.  No shortness of breath assessed at this time.    Kelli Hope M

## 2012-07-27 NOTE — Progress Notes (Signed)
Subjective/Complaints: No problems yesterday. Pain under control. Still a little short of breath. Cough better A 12 point review of systems has been performed and if not noted above is otherwise negative.   Objective: Vital Signs: Blood pressure 124/79, pulse 91, temperature 97.4 F (36.3 C), temperature source Oral, resp. rate 19, weight 71.3 kg (157 lb 3 oz), SpO2 91.00%. No results found.  Recent Labs  07/25/12 0500 07/27/12 0605  WBC 13.8* 10.2  HGB 8.6* 9.4*  HCT 26.3* 28.6*  PLT 379 460*    Recent Labs  07/25/12 0500  CREATININE 0.61   CBG (last 3)   Recent Labs  07/24/12 1126 07/24/12 1608  GLUCAP 120* 107*    Wt Readings from Last 3 Encounters:  07/27/12 71.3 kg (157 lb 3 oz)  07/24/12 75.388 kg (166 lb 3.2 oz)  07/24/12 75.388 kg (166 lb 3.2 oz)    Physical Exam:  Constitutional: She is oriented to person, place, and time.  HENT: oral mucosa pink and moist, dentition good  Head: Normocephalic.  Eyes: EOM are normal.  Neck: Normal range of motion. Neck supple. No thyromegaly present.  Cardiovascular:  Cardiac rate control With reg rhythm, no murmurs  Pulmonary/Chest: Breath sounds normal. No respiratory distress. No wheezes, rales. Limited expansion due to chest discomfort  Abdominal: Soft. Bowel sounds are normal. She exhibits no distension.  Neurological: She is alert and oriented to person, place, and time.  Follows full commands. Cognitively within normal limits. CN 2-12 normal. Sensory exam intact. Motor 4+/5 bilateral UE's. LLE 3-4 hf to 4/5 at ankle. RLE is 2/5 HF, 1+ KE, 4/5 at ankle.  Musc: knee rom 70 deg+ flexion, 1+ edema about the knee Skin:  Midline chest incision healing. Right knee incision with staples intact, no drainage. LLE with multiple ecchymoses and incisions from harvest sites.  Psychiatric: She has a normal mood and affect   Assessment/Plan: 1. Functional deficits secondary to right TKA with intraoperative cardiac arrest/  subsequent CABG which require 3+ hours per day of interdisciplinary therapy in a comprehensive inpatient rehab setting. Physiatrist is providing close team supervision and 24 hour management of active medical problems listed below. Physiatrist and rehab team continue to assess barriers to discharge/monitor patient progress toward functional and medical goals. FIM:       FIM - Toileting Toileting steps completed by patient: Adjust clothing prior to toileting;Performs perineal hygiene;Adjust clothing after toileting Toileting Assistive Devices: Grab bar or rail for support Toileting: 5: Supervision: Safety issues/verbal cues     FIM - Banker Devices: HOB elevated Bed/Chair Transfer: 5: Supine > Sit: Supervision (verbal cues/safety issues);5: Bed > Chair or W/C: Supervision (verbal cues/safety issues);5: Chair or W/C > Bed: Supervision (verbal cues/safety issues)  FIM - Locomotion: Wheelchair Locomotion: Wheelchair: 0: Activity did not occur FIM - Locomotion: Ambulation Locomotion: Ambulation Assistive Devices: Conservation officer, historic buildings Ambulation/Gait Assistance: 5: Supervision Locomotion: Ambulation: 1: Travels less than 50 ft with supervision/safety issues  Comprehension Comprehension Mode: Auditory Comprehension: 7-Follows complex conversation/direction: With no assist  Expression Expression Mode: Verbal Expression: 7-Expresses complex ideas: With no assist  Social Interaction Social Interaction: 7-Interacts appropriately with others - No medications needed.  Problem Solving Problem Solving: 7-Solves complex problems: Recognizes & self-corrects  Memory Memory: 7-Complete Independence: No helper  Medical Problem List and Plan:  1. Right total knee arthroplasty for degenerative joint disease with intraoperative cardiac arrest/status post CABG x4 07/21/2012  2. DVT Prophylaxis/Anticoagulation: -lovenox to coumadin per pharmacy (appreciate  their help)  -  mobilize as tolerated for post-tib and peroneal dvt 3. Pain Management: Oxycodone as needed. Monitor with increased mobility  4. Neuropsych: This patient is capable of making decisions on her own behalf.  5. Acute blood loss anemia. Followup CBC/continue Niferex  6. Hypertension. Lasix 40 mg daily, lisinopril 2.5 mg daily, Lopressor 12.5 mg twice a day. Normotensive at present 7. Hyperlipidemia. Crestor 8. Cough--robitussin, IS, OOB  LOS (Days) 3 A FACE TO FACE EVALUATION WAS PERFORMED  Susan Welch T 07/27/2012 8:17 AM

## 2012-07-27 NOTE — Progress Notes (Signed)
Patient information reviewed and entered into eRehab system by Sanae Willetts, RN, CRRN, PPS Coordinator.  Information including medical coding and functional independence measure will be reviewed and updated through discharge.     Per nursing patient was given "Data Collection Information Summary for Patients in Inpatient Rehabilitation Facilities with attached "Privacy Act Statement-Health Care Records" upon admission.  

## 2012-07-27 NOTE — Progress Notes (Signed)
Occupational Therapy Session Note  Patient Details  Name: Curry Dulski MRN: [id number] Date of Birth: 1945/02/16  Today's Date: 07/27/2012 Time: 1400-1430 Time Calculation (min): 30 min   Skilled Therapeutic Interventions/Progress Updates:    1:1 focus on d/c planning with planning on simple kitchen management tasks, functional mobility in a home environment, activity tolerance in standing, bed mobility and toileting from a distant, recommendation for home set up for energy conservation.   Therapy Documentation Precautions:  Precautions Precautions: Knee;Sternal;Fall (DVT R calf) Precaution Comments: educated patient on sternal and knee precautions, will continue to enforce Restrictions Weight Bearing Restrictions: Yes RUE Weight Bearing: Non weight bearing LUE Weight Bearing: Non weight bearing RLE Weight Bearing: Weight bearing as tolerated Other Position/Activity Restrictions: Sternal precautions Pain: C/o pain  See FIM for current functional status  Therapy/Group: Individual Therapy  Roney Mans [facility] 07/27/2012, 2:55 PM

## 2012-07-27 NOTE — Progress Notes (Signed)
Social Work  Social Work Assessment and Plan  Patient Details  Name: Susan Welch MRN: [id number] Date of Birth: [birth year]  Today's Date: 07/27/2012  Problem List:  Patient Active Problem List   Diagnosis Date Noted  . Knee joint replacement by other means 07/26/2012  . Osteoarthritis of right knee 07/16/2012  . HTN (hypertension) 09/20/2011  . Lipid disorder 09/20/2011   Past Medical History:  Past Medical History  Diagnosis Date  . Arthritis     a. bilat knees s/p L TKA 07/15/2012  . Anemia   . Hypertension   . Anxiety     Hx: of situational anxiety  . Headache(784.0)     Hx: of migraines  . Hyperlipidemia   . Panic attacks   . Ventricular fibrillation     a. 07/15/2012 in setting of R TKA   Past Surgical History:  Past Surgical History  Procedure Laterality Date  . Abdominal hysterectomy    . Dilation and curettage of uterus    . Total knee arthroplasty Right 07/15/2012    Procedure: TOTAL KNEE ARTHROPLASTY;  Surgeon: Loreta Ave, MD;  Location: [facility] OR;  Service: Orthopedics;  Laterality: Right;  drapes pulled back to provide cpr, new drape applied, protocol followed  . Coronary artery bypass graft N/A 07/21/2012    Procedure: CORONARY ARTERY BYPASS GRAFTING (CABG);  Surgeon: Delight Ovens, MD;  Location: [facility] OR;  Service: Open Heart Surgery;  Laterality: N/A;  Times 4 using left internal mammary artery and endoscopically harvested left saphenous vein.  . Intraoperative transesophageal echocardiogram N/A 07/21/2012    Procedure: INTRAOPERATIVE TRANSESOPHAGEAL ECHOCARDIOGRAM;  Surgeon: Delight Ovens, MD;  Location: [facility] OR;  Service: Open Heart Surgery;  Laterality: N/A;   Social History:  reports that she quit smoking about 12 years ago. She does not have any smokeless tobacco history on file. She reports that  drinks alcohol. She reports that she does not use illicit drugs.  Family / Support Systems Marital Status: Divorced How Long?: "years ago" Patient Roles:  Parent Children: son, Kathaleen Dudziak @ ([phone] and dtr-in-law, Misty Stanley @ (C) [phone];  another son, Dorinda Hill, living out of town Anticipated Caregiver: self, may ask dtr-in-law to assist if needed Ability/Limitations of Caregiver: Son and dtr-in-law work Engineer, structural Availability: Intermittent Family Dynamics: pt notes a very good relationship with sons  Social History Preferred language: English Religion: Lutheran Cultural Background: NA Education: college Read: Yes Write: Yes Employment Status: Employed Name of Employer: [employer] Length of Employment: 22 (yrs) Return to Work Plans: pt notes she would very much like to return to work as long as health will Public affairs consultant Issues: none Guardian/Conservator: none   Abuse/Neglect Physical Abuse: Denies Verbal Abuse: Denies Sexual Abuse: Denies Exploitation of patient/patient's resources: Denies Self-Neglect: Denies  Emotional Status Pt's affect, behavior adn adjustment status: Very pleasant, talkative woman who states, "I just feel grateful to be alive", explaining that she understands from MDs that the type of MI she suffered is traditionally called a "widow maker" and that she was actually lucky to have it during her surgery where they could immediately resuscitate her.  She denies any level of emotional distress "I'm just grateful".  Recent Psychosocial Issues: None Pyschiatric History: Pt reports that this past year she began having what MDs felt were "panic attacks", however, she did not take any medication.  She now questions if these "episodes" were cardiac in nature. Substance Abuse History: none  Patient / Family Perceptions, Expectations & Goals Pt/Family  understanding of illness & functional limitations: pt with good understanding of her multiple medical issues and current functional limitations Premorbid pt/family roles/activities: pt was still working full-time and very independent ,however does  note that she had becoming much more sedentary due to knee pain over past few years Anticipated changes in roles/activities/participation: pt aware she will likely need to intermittent assistance of others i.e. groceries, transportation, housekeeping duties Pt/family expectations/goals: Pt goals are to be "as independent as possibleAir traffic controller: None Premorbid Home Care/DME Agencies: None Transportation available at discharge: yes  Discharge Planning Living Arrangements: Alone Support Systems: Children Type of Residence: Private residence Insurance Resources: Electrical engineer Resources: Employment;Social Security Financial Screen Referred: No Living Expenses: Psychologist, sport and exercise Management: Patient Does the patient have any problems obtaining your medications?: No Home Management: patient Patient/Family Preliminary Plans: Pt plans to d/c to her own home with son and daughter-in-law providing intermittent assistance Social Work Anticipated Follow Up Needs: HH/OP Expected length of stay: 1-2 weeks  Clinical Impression Very pleasant woman here after a planned TKR, however, suffering a MI and underwent a CABGx4.  She expresses gratefulness of her circumstances and "...just being alive..."  Good family support.  No significant emotional distress noted but will monitor through stay.  Marikay Roads 07/27/2012, 9:44 PM

## 2012-07-27 NOTE — Progress Notes (Signed)
ANTICOAGULATION CONSULT NOTE - Follow Up Consult  Pharmacy Consult for Warfarin Indication: RLE DVT  Allergies  Allergen Reactions  . Statins Other (See Comments)    Joint pain  . Nickel Rash    Patient Measurements: Weight: 157 lb 3 oz (71.3 kg)  Vital Signs: Temp: 97.4 F (36.3 C) (07/21 0454) Temp src: Oral (07/21 0454) BP: 124/79 mmHg (07/21 0454) Pulse Rate: 91 (07/21 0454)  Labs:  Recent Labs  07/25/12 0500 07/26/12 0932 07/27/12 0605  HGB 8.6*  --  9.4*  HCT 26.3*  --  28.6*  PLT 379  --  460*  LABPROT  --  14.0 14.4  INR  --  1.10 1.14  CREATININE 0.61  --  0.67    The CrCl is unknown because both a height and weight (above a minimum accepted value) are required for this calculation.   Assessment: 67 y.o. F s/p recent R-TKR on 7/9 and CABG x 4 on 07/21/12 and transferred to IR for rehabilitation on 7/18. Dopplers confirmed RLE DVT on 7/19, currently on D#3 lovenox bridge to therapeutic coumadin. INR remains SUBtherapeutic (INR 1.14). hgb 9.4, plt 460 both are improving from 7/19. No overt s/sx of bleeding noted per chart. Scr stable, lovenox dose appropriate.  Goal of Therapy:  INR 2-3 Anti-Xa level 0.6-1.2 units/ml 4hrs after LMWH dose given Monitor platelets by anticoagulation protocol: Yes   Plan:  1. Continue Lovenox 70 mg SQ every 12 hours 2. Warfarin 7.5 mg x 1 dose at 1800 today 3. Will continue to monitor for any signs/symptoms of bleeding and will follow up with PT/INR in the a.m.   Bayard Hugger, PharmD, BCPS  Clinical Pharmacist  Pager: [phone]  07/27/2012 11:27 AM

## 2012-07-27 NOTE — Progress Notes (Signed)
Physical Therapy Session Note  Patient Details  Name: Susan Welch MRN: [id number] Date of Birth: 1945-01-28  Today's Date: 07/27/2012 Time: 1308-6578 Time Calculation (min): 60 min  Short Term Goals: Week 1:  PT Short Term Goal 1 (Week 1): STG=LTG  Skilled Therapeutic Interventions/Progress Updates:  This tx session focused on amb endurance and R TKA strength/ROM. Pt able to amb 162', 75' w/ RW and (S), however, limited tolerance to activity following second bout by fatigue and increased pain in R knee. Pt did not wish to ask for pain medicine at that time. Gait characterized by steady step through pattern and ability to achieve R heel contact to promote increased R knee flexion. Increased pronation noted in R foot during amb. Pt instructed on supine and seated TKA exercises for improved strength and ROM as well as provided handout of exercises. Exercises included: ankle pumps, quad sets, heel slides into knee flex and hip abd/add, SAQ, SLR as well as knee flexion in sitting (1x10 each). Pt w/ fair-good tolerance of exercises.      Therapy Documentation Precautions:  Precautions Precautions: Knee;Sternal;Fall (DVT R calf) Precaution Comments: educated patient on sternal and knee precautions, will continue to enforce Restrictions Weight Bearing Restrictions: Yes RUE Weight Bearing: Non weight bearing LUE Weight Bearing: Non weight bearing RLE Weight Bearing: Weight bearing as tolerated Other Position/Activity Restrictions: Sternal precautions    Pain: Pain Assessment Pain Assessment: 0-10 (3/10 at rest, increased pain during amb) Pain Score: 3  Pain Location: Knee Pain Orientation: Right Pain Intervention(s): Other (Comment) (changed activity; pt did not wish to ask for pain meds )  See FIM for current functional status  Therapy/Group: Individual Therapy  Denzil Hughes 07/27/2012, 2:20 PM

## 2012-07-27 NOTE — Progress Notes (Signed)
Physical Therapy Session Note  Patient Details  Name: Susan Welch MRN: [id number] Date of Birth: 19-Jul-1945  Today's Date: 07/27/2012 Time: 8295-6213 Time Calculation (min): 60 min  Short Term Goals: Week 1:  PT Short Term Goal 1 (Week 1): STG=LTG  Skilled Therapeutic Interventions/Progress Updates:   This treatment session focused on functional transfers, amb endurance and curb step negotiation. Pt able to recall all sternal and R LE precautions at start of tx session w/ min cues, pt also able to successful incorporate precautions into functional mobility w/out cues. Pt able to perform successful t/f sup<>sit EOB in a standard bed with (S). Bed height accommodated to standard height as well as elevated height w/ use of raisers as pt has an elevated bed at home and will req use of a step for boosting, which she was able to negotiate well. Pt able to amb 120'x2 w/ RW and (S), however, fatigued after bouts of amb and requesting to use w/c at end of session. Pt able to negotiate up/down single 4", curb step w/ use of RW and min guard-minA. Pt returned to room at end of tx session w/ CPM in place, set to 78deg flex, and all needs w/in reach.    Therapy Documentation Precautions:  Precautions Precautions: Knee;Sternal;Fall (DVT R calf) Precaution Comments: educated patient on sternal and knee precautions, will continue to enforce Restrictions Weight Bearing Restrictions: Yes RUE Weight Bearing: Non weight bearing LUE Weight Bearing: Non weight bearing RLE Weight Bearing: Weight bearing as tolerated Other Position/Activity Restrictions: Sternal precautions  Vital Signs: Pt on RA throughout tx session, no s/s of SOB. Unable to achieve pulse ox reading after 2 attempts.   Pain: Pain Assessment Pain Assessment: 0-10 Pain Score: 3  Pt reports receiving pain meds prior to tx session.   Therapy/Group: Individual Therapy  Denzil Hughes 07/27/2012, 12:00 PM

## 2012-07-28 ENCOUNTER — Inpatient Hospital Stay (HOSPITAL_COMMUNITY): Payer: Medicare Other | Admitting: Physical Therapy

## 2012-07-28 ENCOUNTER — Inpatient Hospital Stay (HOSPITAL_COMMUNITY): Payer: Medicare Other | Admitting: *Deleted

## 2012-07-28 ENCOUNTER — Encounter (HOSPITAL_COMMUNITY): Payer: Medicare Other | Admitting: Occupational Therapy

## 2012-07-28 ENCOUNTER — Inpatient Hospital Stay (HOSPITAL_COMMUNITY): Payer: Medicare Other | Admitting: Occupational Therapy

## 2012-07-28 LAB — PROTIME-INR
INR: 1.3 (ref 0.00–1.49)
Prothrombin Time: 15.9 seconds — ABNORMAL HIGH (ref 11.6–15.2)

## 2012-07-28 MED ORDER — WARFARIN SODIUM 7.5 MG PO TABS
7.5000 mg | ORAL_TABLET | Freq: Once | ORAL | Status: AC
  Administered 2012-07-28: 7.5 mg via ORAL
  Filled 2012-07-28: qty 1

## 2012-07-28 MED ORDER — GUAIFENESIN ER 600 MG PO TB12
600.0000 mg | ORAL_TABLET | Freq: Two times a day (BID) | ORAL | Status: DC
  Administered 2012-07-28 – 2012-07-31 (×5): 600 mg via ORAL
  Filled 2012-07-28 (×9): qty 1

## 2012-07-28 NOTE — Progress Notes (Signed)
Occupational Therapy Session Note  Patient Details  Name: Di Jasmer MRN: [id number] Date of Birth: December 01, 1945  Today's Date: 07/28/2012 Time: 1400-1445 Time Calculation (min): 45 min   Skilled Therapeutic Interventions/Progress Updates:    1:1 focus on community mobility with trial of Rollator for energy conservation indoors and outdoors with practice stepping up/down curb. Toilet transfer and toileting mod I   Therapy Documentation Precautions:  Precautions Precautions: Knee;Sternal;Fall (DVT R calf) Precaution Comments: educated patient on sternal and knee precautions, will continue to enforce Restrictions Weight Bearing Restrictions: Yes (sternal precautions) RUE Weight Bearing: Non weight bearing LUE Weight Bearing: Non weight bearing RLE Weight Bearing: Weight bearing as tolerated Other Position/Activity Restrictions: Sternal precautions Pain:  no c/o pain   See FIM for current functional status  Therapy/Group: Individual Therapy  Roney Mans [facility] 07/28/2012, 4:32 PM

## 2012-07-28 NOTE — Progress Notes (Signed)
Patient ID: Susan Welch, female   DOB: 07-Apr-1945, 67 y.o.   MRN: [id number]      [address]       [name] [address]             [phone]                      LOS: 4 days   Subjective: Making good progress in rehab, now one week post cabg  Objective: Vital signs in last 24 hours: Patient Vitals for the past 24 hrs:  BP Temp Temp src Pulse Resp SpO2 Weight  07/28/12 1445 135/81 mmHg 97.6 F (36.4 C) Oral 72 18 95 % -  07/28/12 0517 116/75 mmHg 98.2 F (36.8 C) Oral 94 19 98 % 156 lb 15.5 oz (71.2 kg)  07/27/12 2023 124/78 mmHg - - 105 - - -    Filed Weights   07/24/12 2100 07/27/12 0454 07/28/12 0517  Weight: 156 lb 8.4 oz (71 kg) 157 lb 3 oz (71.3 kg) 156 lb 15.5 oz (71.2 kg)    Hemodynamic parameters for last 24 hours:    Intake/Output from previous day: 07/21 0701 - 07/22 0700 In: 720 [P.O.:720] Out: -  Intake/Output this shift: Total I/O In: 480 [P.O.:480] Out: -   Scheduled Meds: . aspirin EC  325 mg Oral Daily  . enoxaparin (LOVENOX) injection  70 mg Subcutaneous Q12H  . folic acid  1 mg Oral Daily  . guaiFENesin  600 mg Oral BID  . iron polysaccharides  150 mg Oral Daily  . lisinopril  2.5 mg Oral Daily  . metoprolol tartrate  25 mg Oral BID  . pantoprazole  40 mg Oral QAC breakfast  . rosuvastatin  5 mg Oral q1800  . warfarin  7.5 mg Oral ONCE-1800  . Warfarin - Pharmacist Dosing Inpatient   Does not apply q1800   Continuous Infusions:  PRN Meds:.acetaminophen, ondansetron (ZOFRAN) IV, ondansetron, oxyCODONE, senna-docusate, sorbitol  General appearance: alert and cooperative Neurologic: intact Heart: regular rate and rhythm, S1, S2 normal, no murmur, click, rub or gallop Lungs: clear to auscultation bilaterally Abdomen: soft, non-tender; bowel sounds normal; no masses,  no organomegaly Extremities: incisions healing Wound: sternum stable  Lab Results: CBC: Recent Labs  07/27/12 0605  WBC 10.2  HGB 9.4*  HCT  28.6*  PLT 460*   BMET:  Recent Labs  07/27/12 0605  NA 137  K 3.8  CL 98  CO2 30  GLUCOSE 99  BUN 18  CREATININE 0.67  CALCIUM 9.1    PT/INR:  Recent Labs  07/28/12 0730  LABPROT 15.9*  INR 1.30     Radiology No results found.   Assessment/Plan: S/P   cabg one week ago Good progress with rehab Poss home end of week Started on coumadin and Lovenox for rt leg dvt     Delight Ovens MD 07/28/2012 4:40 PM

## 2012-07-28 NOTE — Progress Notes (Addendum)
Pt's o2 sat 91% on room air at rest; assisted with IS; monitor, check during therapies.  Pt c/o increased discomfort at chest incision site stating, "I slept in an awkward position."  Reviewed sternal precautions with pt..  VSS, monitor.  1600: Continues to c/o sternal discomfort, coughing, Dr. Riley Kill aware, placed on Mucinex, secretions thin; splinting Monitor. Pt has small amt. blood tinged drainage from LLE distal incision, one suture in place. Cleansed and redressed.

## 2012-07-28 NOTE — Progress Notes (Signed)
Physical Therapy Session Note  Patient Details  Name: Susan Welch MRN: [id number] Date of Birth: [birth year]  Today's Date: 07/28/2012 Time: 1330-1400 Time Calculation (min): 30 min  Short Term Goals: Week 1:  PT Short Term Goal 1 (Week 1): STG=LTG  Skilled Therapeutic Interventions/Progress Updates:   Pt reports increased sternal with movement but overall improvement, reports R knee has much improved pain.  Pt received review of prescribed HEP written handout with pt requiring min vcs to improve positioning and correct performance, pt performed 2 x 10.  Pt also performed gait training with RW with supervision assist with step through gait pattern slight decreased DF with heel strike and pt reporting with fatigue some difficulty clearing floor with L LE, pt instructed with proper weight shifting and appropriate rests to improve c/o fatigue.    Therapy Documentation Precautions:  Precautions Precautions: Knee;Sternal;Fall (DVT R calf) Precaution Comments: educated patient on sternal and knee precautions, will continue to enforce Restrictions Weight Bearing Restrictions: Yes (sternal precautions) RUE Weight Bearing: Non weight bearing LUE Weight Bearing: Non weight bearing RLE Weight Bearing: Weight bearing as tolerated Other Position/Activity Restrictions: Sternal precautions    Vital Signs: Oxygen Therapy SpO2: 96 % O2 Device: None (Room air) Pain: Pt reports sternal pain 4/10, R LE 2/10.      Locomotion : Ambulation Ambulation/Gait Assistance: 5: Supervision                See FIM for current functional status  Therapy/Group: Individual Therapy  Jackelyn Knife 07/28/2012, 3:52 PM

## 2012-07-28 NOTE — Evaluation (Signed)
Recreational Therapy Assessment and Plan  Patient Details  Name: Susan Welch MRN: [id number] Date of Birth: 1945/11/16 Today's Date: 07/28/2012  Rehab Potential: Good ELOS: 10 days   Assessment Clinical Impression: Problem List:  Patient Active Problem List    Diagnosis  Date Noted   .  Osteoarthritis of right knee  07/16/2012   .  HTN (hypertension)  09/20/2011   .  Lipid disorder  09/20/2011    Past Medical History:  Past Medical History   Diagnosis  Date   .  Arthritis      a. bilat knees s/p L TKA 07/15/2012   .  Anemia    .  Hypertension    .  Anxiety      Hx: of situational anxiety   .  Headache(784.0)      Hx: of migraines   .  Hyperlipidemia    .  Panic attacks    .  Ventricular fibrillation      a. 07/15/2012 in setting of R TKA    Past Surgical History:  Past Surgical History   Procedure  Laterality  Date   .  Abdominal hysterectomy     .  Dilation and curettage of uterus     .  Total knee arthroplasty  Right  07/15/2012     Procedure: TOTAL KNEE ARTHROPLASTY; Surgeon: Loreta Ave, MD; Location: [facility] OR; Service: Orthopedics; Laterality: Right; drapes pulled back to provide cpr, new drape applied, protocol followed   .  Coronary artery bypass graft  N/A  07/21/2012     Procedure: CORONARY ARTERY BYPASS GRAFTING (CABG); Surgeon: Delight Ovens, MD; Location: [facility] OR; Service: Open Heart Surgery; Laterality: N/A; Times 4 using left internal mammary artery and endoscopically harvested left saphenous vein.   .  Intraoperative transesophageal echocardiogram  N/A  07/21/2012     Procedure: INTRAOPERATIVE TRANSESOPHAGEAL ECHOCARDIOGRAM; Surgeon: Delight Ovens, MD; Location: [facility] OR; Service: Open Heart Surgery; Laterality: N/A;    Assessment & Plan  Clinical Impression: Susan Welch is a 67 y.o. right-handed female with history of hypertension. Patient independent prior to admission working full time. Admitted 07/14/2012 with end-stage degenerative changes right knee  and underwent total knee replacement 07/15/2012 per Dr. Eulah Pont. Patient with intraoperative cardiac arrest and was resuscitated with cardiology services consulted. EKG showing inferior wall myocardial infarction. Cardiac catheterization showed severe three-vessel coronary artery disease with total occlusion of right coronary artery. Underwent CABG x4 07/21/2012 per Dr. Tyrone Sage. Patient maintained on subcutaneous Lovenox for DVT prophylaxis. Hospital course anemia 8.0 and monitored. Physical and occupational therapy evaluations completed 07/22/2012 patient is weightbearing as tolerated right lower extremity after total knee replacement with sternal precautions. Recommendations have been made for physical medicine rehabilitation consult to consider inpatient rehabilitation services. Patient was felt to be a good candidate for inpatient rehabilitation services and was admitted for comprehensive rehabilitation program. Patient transferred to CIR on 07/24/2012.  Pt presents with decreased activity tolerance, decreased functional mobility, decreased balance limiting pt's independence with leisure/community pursuits.Met with pt and discussed leisure interest and community pursuits.  Therapy session to be focused on education pertaining to community reintegration.  Leisure History/Participation Premorbid leisure interest/current participation: Games - Word-search;Community - Grocery store;Community - Health and safety inspector - Knitting/Crocheting Expression Interests: Music (Comment) Other Leisure Interests: Counselling psychologist;Reading Leisure Participation Style: Alone;With Family/Friends Awareness of Community Resources: Good-identify 3 post discharge leisure resources Psychosocial / Spiritual Patient agreeable to Pet Therapy: Yes Does patient have pets?: Yes (cat) Social interaction - Mood/Behavior: Cooperative Film/video editor  for Education?: Yes Recreational Therapy Orientation Orientation -Reviewed  with patient: Available activity resources Strengths/Weaknesses Patient Strengths/Abilities: Willingness to participate Patient weaknesses: Physical limitations  Plan Rec Therapy Plan Is patient appropriate for Therapeutic Recreation?: Yes Rehab Potential: Good Treatment times per week: Min 1 time for community reintegration education Estimated Length of Stay: 10 days TR Treatment/Interventions: Adaptive equipment instruction;Community reintegration;1:1 session;Patient/family education  Recommendations for other services: None  Discharge Criteria: Patient will be discharged from TR if patient refuses treatment 3 consecutive times without medical reason.  If treatment goals not met, if there is a change in medical status, if patient makes no progress towards goals or if patient is discharged from hospital.  The above assessment, treatment plan, treatment alternatives and goals were discussed and mutually agreed upon: by patient  Braidon Chermak 07/28/2012, 4:15 PM

## 2012-07-28 NOTE — Progress Notes (Signed)
Inpatient Rehabilitation Center Individual Statement of Services  Patient Name:  Susan Welch  Date:  07/28/2012  Welcome to the Inpatient Rehabilitation Center.  Our goal is to provide you with an individualized program based on your diagnosis and situation, designed to meet your specific needs.  With this comprehensive rehabilitation program, you will be expected to participate in at least 3 hours of rehabilitation therapies Monday-Friday, with modified therapy programming on the weekends.  Your rehabilitation program will include the following services:  Physical Therapy (PT), Occupational Therapy (OT), 24 hour per day rehabilitation nursing, Therapeutic Recreaction (TR), Case Management (Social Worker), Rehabilitation Medicine, Nutrition Services and Pharmacy Services  Weekly team conferences will be held on Tuesdays to discuss your progress.  Your Social Worker will talk with you frequently to get your input and to update you on team discussions.  Team conferences with you and your family in attendance may also be held.  Expected length of stay: 10 days  Overall anticipated outcome: modified independent  Depending on your progress and recovery, your program may change. Your Social Worker will coordinate services and will keep you informed of any changes. Your Child psychotherapist names and contact numbers are listed  below.  The following services may also be recommended but are not provided by the Inpatient Rehabilitation Center:   Driving Evaluations  Home Health Rehabiltiation Services  Outpatient Rehabilitatation [facility]  Vocational Rehabilitation   Arrangements will be made to provide these services after discharge if needed.  Arrangements include referral to agencies that provide these services.  Your insurance has been verified to be:  [employer] Your primary doctor is:  Dr. Audria Nine  Pertinent information will be shared with your doctor and your insurance company.  Social  Worker:  [place], [place] [phone] or ([phone]  Information discussed with and copy given to patient by: Amada Jupiter, 07/28/2012, 8:41 AM

## 2012-07-28 NOTE — Progress Notes (Signed)
ANTICOAGULATION CONSULT NOTE - Follow Up Consult  Pharmacy Consult for Warfarin/Lovenox Indication: RLE DVT  Allergies  Allergen Reactions  . Statins Other (See Comments)    Joint pain  . Nickel Rash    Patient Measurements: Weight: 156 lb 15.5 oz (71.2 kg)  Vital Signs: Temp: 98.2 F (36.8 C) (07/22 0517) Temp src: Oral (07/22 0517) BP: 116/75 mmHg (07/22 0517) Pulse Rate: 94 (07/22 0517)  Labs:  Recent Labs  07/26/12 0932 07/27/12 0605 07/28/12 0730  HGB  --  9.4*  --   HCT  --  28.6*  --   PLT  --  460*  --   LABPROT 14.0 14.4 15.9*  INR 1.10 1.14 1.30  CREATININE  --  0.67  --     The CrCl is unknown because both a height and weight (above a minimum accepted value) are required for this calculation.   Assessment: 67 y.o. F s/p recent R-TKR on 7/9 and CABG x 4 on 07/21/12 and transferred to IR on 7/18. Dopplers confirmed RLE DVT on 7/19, currently on D#4 lovenox bridge to therapeutic coumadin. INR remains SUBtherapeutic (INR 1.3), but trending up. No overt s/sx of bleeding noted per chart. Scr stable, lovenox dose appropriate.  Goal of Therapy:  INR 2-3 Anti-Xa level 0.6-1.2 units/ml 4hrs after LMWH dose given Monitor platelets by anticoagulation protocol: Yes   Plan:  1. Continue Lovenox 70 mg SQ every 12 hours 2. Warfarin 7.5 mg x 1 dose at 1800 today 3. Will continue to monitor for any signs/symptoms of bleeding and will follow up with PT/INR in the a.m.   Bayard Hugger, PharmD, BCPS  Clinical Pharmacist  Pager: [phone]  07/28/2012 12:47 PM

## 2012-07-28 NOTE — Progress Notes (Signed)
Occupational Therapy Session Note  Patient Details  Name: Susan Welch MRN: [id number] Date of Birth: 1945-06-21  Today's Date: 07/28/2012 Time: 0830-0930 Time Calculation (min): 60 min   Skilled Therapeutic Interventions/Progress Updates:    1:1 self care retraining in tub room with focus on functional ambulation with RW, obtaining items and setting bathroom up for her to independent in a simulated home situation, problem solving home situations, activity tolerance, min encouragement to stop and practiced pursed lip deep breathing.   Therapy Documentation Precautions:  Precautions Precautions: Knee;Sternal;Fall (DVT R calf) Precaution Comments: educated patient on sternal and knee precautions, will continue to enforce Restrictions Weight Bearing Restrictions: Yes RUE Weight Bearing: Non weight bearing (sternal precautions) LUE Weight Bearing: Non weight bearing (sternal precautions) RLE Weight Bearing: Weight bearing as tolerated Other Position/Activity Restrictions: Sternal precautions Pain: Pain Assessment Pain Assessment: No/denies pain Pain Score: 0-No pain ("discomfort"- no pain per pt report, has had pain meds today)  See FIM for current functional status  Therapy/Group: Individual Therapy  Roney Mans [facility] 07/28/2012, 11:27 AM

## 2012-07-28 NOTE — Progress Notes (Signed)
Physical Therapy Session Note  Patient Details  Name: Susan Welch MRN: [id number] Date of Birth: 08/20/1945  Today's Date: 07/28/2012 Time: 1030-1130 Time Calculation (min): 60 min  Short Term Goals: Week 1:  PT Short Term Goal 1 (Week 1): STG=LTG  Skilled Therapeutic Interventions/Progress Updates:  Pt able to amb 300' from her room to the apartment set up w/ 2 standing rest breaks. Pt demonstrates slow, but steady step through pattern and able to walk and talk during entire bout. Hoewver, pt SOB upon completion and recovered w/ seated rest break. Pt able to negotiate in/out of elevated bed as well as t/f sup<>sit w/ use of 6" step, RW, (S) and ability to follow sternal precautions. Discussed kitchen set up at length as well as simulated maneuvering in kitchen and ability to negotiate cabinets, plates and cups as needed at home. Pt demonstrated good problem solving skills for managing items as well as RW, however, did req min v/c for sternal precautions when reaching for objects in kitchen. Pt w/ good tolerance to tx session overall. Also discussed at length d/c process as pt is slightly anxious about going home.   Therapy Documentation Precautions:  Precautions Precautions: Knee;Sternal;Fall (DVT R calf) Precaution Comments: educated patient on sternal and knee precautions, will continue to enforce Restrictions Weight Bearing Restrictions: Yes RUE Weight Bearing: Non weight bearing (sternal precautions) LUE Weight Bearing: Non weight bearing (sternal precautions) RLE Weight Bearing: Weight bearing as tolerated Other Position/Activity Restrictions: Sternal precautions   Pain: Pain Assessment Pain Assessment: No/denies pain Pain Score: 0-No pain ("discomfort"- no pain per pt report, has had pain meds today)    See FIM for current functional status  Therapy/Group: Individual Therapy  Denzil Hughes 07/28/2012, 12:08 PM

## 2012-07-28 NOTE — Progress Notes (Signed)
Subjective/Complaints: Still some cough. Right knee a little tight. A 12 point review of systems has been performed and if not noted above is otherwise negative.   Objective: Vital Signs: Blood pressure 116/75, pulse 94, temperature 98.2 F (36.8 C), temperature source Oral, resp. rate 19, weight 71.2 kg (156 lb 15.5 oz), SpO2 98.00%. No results found.  Recent Labs  07/27/12 0605  WBC 10.2  HGB 9.4*  HCT 28.6*  PLT 460*    Recent Labs  07/27/12 0605  NA 137  K 3.8  CL 98  GLUCOSE 99  BUN 18  CREATININE 0.67  CALCIUM 9.1   CBG (last 3)  No results found for this basename: GLUCAP,  in the last 72 hours  Wt Readings from Last 3 Encounters:  07/28/12 71.2 kg (156 lb 15.5 oz)  07/24/12 75.388 kg (166 lb 3.2 oz)  07/24/12 75.388 kg (166 lb 3.2 oz)    Physical Exam:  Constitutional: She is oriented to person, place, and time.  HENT: oral mucosa pink and moist, dentition good  Head: Normocephalic.  Eyes: EOM are normal.  Neck: Normal range of motion. Neck supple. No thyromegaly present.  Cardiovascular:  Cardiac rate control. With reg rhythm, no murmurs  Pulmonary/Chest: Breath sounds normal. No respiratory distress. Oc exp wheezes, rales. Limited expansion due to chest discomfort  Abdominal: Soft. Bowel sounds are normal. She exhibits no distension.  Neurological: She is alert and oriented to person, place, and time.  Follows full commands. Cognitively within normal limits. CN 2-12 normal. Sensory exam intact. Motor 4+/5 bilateral UE's. LLE 3-4 hf to 4/5 at ankle. RLE is 2/5 HF, 1+ KE, 4/5 at ankle.  Musc: knee rom 70 deg+ flexion, 1+ edema about the knee Skin:  Midline chest incision healing. Right knee incision with staples intact, no drainage. LLE with multiple ecchymoses and incisions from harvest sites. One incision site opened with mild sero-sanginous drainage  Psychiatric: She has a normal mood and affect   Assessment/Plan: 1. Functional deficits secondary  to right TKA with intraoperative cardiac arrest/ subsequent CABG which require 3+ hours per day of interdisciplinary therapy in a comprehensive inpatient rehab setting. Physiatrist is providing close team supervision and 24 hour management of active medical problems listed below. Physiatrist and rehab team continue to assess barriers to discharge/monitor patient progress toward functional and medical goals. FIM: FIM - Bathing Bathing Steps Patient Completed: Chest;Right Arm;Left Arm;Abdomen;Right upper leg;Left upper leg Bathing: 3: Mod-Patient completes 5-7 56f 10 parts or 50-74% (per OT notes assist with lower body)  FIM - Upper Body Dressing/Undressing Upper body dressing/undressing steps patient completed: Thread/unthread right bra strap;Thread/unthread left bra strap;Hook/unhook bra;Thread/unthread right sleeve of pullover shirt/dresss;Thread/unthread left sleeve of pullover shirt/dress;Put head through opening of pull over shirt/dress;Pull shirt over trunk Upper body dressing/undressing: 5: Set-up assist to: Obtain clothing/put away (per OT report) FIM - Lower Body Dressing/Undressing Lower body dressing/undressing steps patient completed: Thread/unthread right underwear leg;Thread/unthread left underwear leg;Pull underwear up/down;Thread/unthread right pants leg;Thread/unthread left pants leg;Pull pants up/down;Fasten/unfasten pants;Don/Doff right sock;Don/Doff left sock;Don/Doff right shoe;Don/Doff left shoe Lower body dressing/undressing: 4: Steadying Assist (per OT report)  FIM - Toileting Toileting steps completed by patient: Adjust clothing prior to toileting;Performs perineal hygiene;Adjust clothing after toileting Toileting Assistive Devices: Grab bar or rail for support Toileting: 5: Supervision: Safety issues/verbal cues  FIM - Diplomatic Services operational officer Devices: Grab bars Toilet Transfers: 5-To toilet/BSC: Supervision (verbal cues/safety issues)  FIM -  Banker Devices: HOB elevated Bed/Chair Transfer:  5: Supine > Sit: Supervision (verbal cues/safety issues);5: Bed > Chair or W/C: Supervision (verbal cues/safety issues);5: Chair or W/C > Bed: Supervision (verbal cues/safety issues)  FIM - Locomotion: Wheelchair Locomotion: Wheelchair: 1: Total Assistance/staff pushes wheelchair (Pt<25%) FIM - Locomotion: Ambulation Locomotion: Ambulation Assistive Devices: Designer, industrial/product Ambulation/Gait Assistance: 5: Supervision Locomotion: Ambulation: 2: Travels 50 - 149 ft with supervision/safety issues  Comprehension Comprehension Mode: Auditory Comprehension: 7-Follows complex conversation/direction: With no assist  Expression Expression Mode: Verbal Expression: 7-Expresses complex ideas: With no assist  Social Interaction Social Interaction: 7-Interacts appropriately with others - No medications needed.  Problem Solving Problem Solving: 7-Solves complex problems: Recognizes & self-corrects  Memory Memory: 7-Complete Independence: No helper  Medical Problem List and Plan:  1. Right total knee arthroplasty for degenerative joint disease with intraoperative cardiac arrest/status post CABG x4 07/21/2012   -remove staples today  -continue sutures LLE, steristrip over one open area 2. DVT Prophylaxis/Anticoagulation: -lovenox to coumadin per pharmacy (appreciate their help)  -mobilize as tolerated for post-tib and peroneal dvt 3. Pain Management: Oxycodone as needed. Monitor with increased mobility  4. Neuropsych: This patient is capable of making decisions on her own behalf.  5. Acute blood loss anemia. Followup CBC/continue Niferex  6. Hypertension. Lasix 40 mg daily, lisinopril 2.5 mg daily, Lopressor 12.5 mg twice a day. Normotensive at present 7. Hyperlipidemia. Crestor 8. Cough--robitussin, IS, OOB  LOS (Days) 4 A FACE TO FACE EVALUATION WAS PERFORMED  SWARTZ,ZACHARY T 07/28/2012 7:47 AM

## 2012-07-29 ENCOUNTER — Inpatient Hospital Stay (HOSPITAL_COMMUNITY): Payer: Medicare Other

## 2012-07-29 ENCOUNTER — Inpatient Hospital Stay (HOSPITAL_COMMUNITY): Payer: Medicare Other | Admitting: Physical Therapy

## 2012-07-29 ENCOUNTER — Inpatient Hospital Stay (HOSPITAL_COMMUNITY): Payer: Medicare Other | Admitting: *Deleted

## 2012-07-29 ENCOUNTER — Inpatient Hospital Stay (HOSPITAL_COMMUNITY): Payer: Medicare Other | Admitting: Occupational Therapy

## 2012-07-29 DIAGNOSIS — I80299 Phlebitis and thrombophlebitis of other deep vessels of unspecified lower extremity: Secondary | ICD-10-CM

## 2012-07-29 DIAGNOSIS — IMO0002 Reserved for concepts with insufficient information to code with codable children: Secondary | ICD-10-CM

## 2012-07-29 DIAGNOSIS — Z96659 Presence of unspecified artificial knee joint: Secondary | ICD-10-CM

## 2012-07-29 DIAGNOSIS — M1710 Unilateral primary osteoarthritis, unspecified knee: Secondary | ICD-10-CM

## 2012-07-29 DIAGNOSIS — I2510 Atherosclerotic heart disease of native coronary artery without angina pectoris: Secondary | ICD-10-CM

## 2012-07-29 LAB — PROTIME-INR
INR: 1.77 — ABNORMAL HIGH (ref 0.00–1.49)
Prothrombin Time: 20.1 seconds — ABNORMAL HIGH (ref 11.6–15.2)

## 2012-07-29 MED ORDER — WARFARIN SODIUM 6 MG PO TABS
6.0000 mg | ORAL_TABLET | Freq: Once | ORAL | Status: AC
  Administered 2012-07-29: 6 mg via ORAL
  Filled 2012-07-29: qty 1

## 2012-07-29 NOTE — Progress Notes (Signed)
Social Work Patient ID: Susan Welch, female   DOB: April 16, 1945, 67 y.o.   MRN: [id number]  Met with patient yesterday afternoon following team conference.  Pt aware and agreeable with targeted d/c 725 @ mod i goals overall.  Notes son will be able to assist with d/c transport and "... Get me set up at home".  Will arrange HH f/u to start.  No concerns at this time.  [name], LCSW

## 2012-07-29 NOTE — Progress Notes (Signed)
Physical Therapy Session Note  Patient Details  Name: Susan Welch MRN: [id number] Date of Birth: [birth year]  Today's Date: 07/29/2012 Time: 1000-1100 Time Calculation (min): 60 min  Short Term Goals: Week 1:  PT Short Term Goal 1 (Week 1): STG=LTG  Skilled Therapeutic Interventions/Progress Updates:     This tx session focused on functional mobility w/ use of rollator. Pt able to amb x250' w/ rollator at start of tx session from room>gym w/ car, however, pt req 2 rest breaks at end of tx session (1 seated, 1 standing) during amb x250' back to room. Pt demonstrates improved fluidity of gait pattern and decreased SOB w/ use of rollator vs. RW. Gait pattern continues to remain slow, but steady step through pattern. Pt able to t/f into/out of car w/ rollator and (S), however, req min v/c regarding sternal precautions when managing car door. Pt educated at length, performed and was provided written handout sheet regarding LE sequencing during curb step negotiation as this was cognitively challenging for the pt to understand. Pt demonstrated improved understanding at end of tx session, however, will readdress for solidified learning.   Therapy Documentation Precautions:  Precautions Precautions: Knee;Sternal;Fall (DVT R calf) Precaution Comments: educated patient on sternal and knee precautions, will continue to enforce Restrictions Weight Bearing Restrictions: Yes RUE Weight Bearing: Non weight bearing LUE Weight Bearing: Non weight bearing RLE Weight Bearing: Weight bearing as tolerated Other Position/Activity Restrictions: Sternal precautions    Pain: Pain Assessment Pain Assessment: 0-10 Pain Score: 3  Pain Location: Sternum Pain Descriptors / Indicators: Discomfort Pain Intervention(s):  (Pt reports receving pain meds this AM)  See FIM for current functional status  Therapy/Group: Individual Therapy  Denzil Hughes 07/29/2012, 12:19 PM

## 2012-07-29 NOTE — Progress Notes (Signed)
ANTICOAGULATION CONSULT NOTE - Follow Up Consult  Pharmacy Consult for Warfarin/Lovenox Indication: RLE DVT  Allergies  Allergen Reactions  . Statins Other (See Comments)    Joint pain  . Nickel Rash    Patient Measurements: Weight: 153 lb 8 oz (69.627 kg)  Vital Signs: Temp: 97.5 F (36.4 C) (07/23 0504) Temp src: Oral (07/23 0504) BP: 122/74 mmHg (07/23 0759) Pulse Rate: 114 (07/23 0759)  Labs:  Recent Labs  07/27/12 0605 07/28/12 0730 07/29/12 0550  HGB 9.4*  --   --   HCT 28.6*  --   --   PLT 460*  --   --   LABPROT 14.4 15.9* 20.1*  INR 1.14 1.30 1.77*  CREATININE 0.67  --   --     The CrCl is unknown because both a height and weight (above a minimum accepted value) are required for this calculation.   Assessment: 67 y.o. F s/p recent R-TKR on 7/9 and CABG x 4 on 07/21/12 and transferred to IR on 7/18. Dopplers confirmed RLE DVT on 7/19, currently on D#5 lovenox bridge to therapeutic coumadin. INR remains SUBtherapeutic (INR 1.77), but trending up nicely. No overt s/sx of bleeding noted per chart.  Goal of Therapy:  INR 2-3 Anti-Xa level 0.6-1.2 units/ml 4hrs after LMWH dose given Monitor platelets by anticoagulation protocol: Yes   Plan:  1. Continue Lovenox 70 mg SQ every 12 hours 2. Warfarin 6 mg x 1 dose at 1800 today 3. Will continue to monitor for any signs/symptoms of bleeding and will follow up with PT/INR in the a.m.   Bayard Hugger, PharmD, BCPS  Clinical Pharmacist  Pager: [phone]  07/29/2012 12:58 PM

## 2012-07-29 NOTE — Progress Notes (Signed)
Occupational Therapy Session Note  Patient Details  Name: Susan Welch MRN: [id number] Date of Birth: August 05, 1945  Today's Date: 07/29/2012  Short Term Goals: Week 1:     Skilled Therapeutic Interventions/Progress Updates:  Session Note: Time: 0830-0940 (70 mins) Pt with reported pain in sternum. No rate given.  Upon entering room, pt supine in bed. Pt did not have O2 on but stated she felt SOB. Oxygen stats checked (88%) and HR=125. Pt instructed to use pursed-lip breathing and placed 2L O2 on pt via nasal cannula. Pt->EOB. From here, pt ambulated with rollator around room to collect clothing and toiletries for BADL session. Pt stated she did not feel SOB and OT removed O2. OT pushed pt in w/c->ADL apt. Pt ambulated with rollator->BR and transferred<>tub bench (mod I). OT covered stitches on L leg. Pt completed bathing task from this level with forward leans for peri care. Pt completed dressing task seated on rollator with sit<>stand for pulling up pants. Pt ambulated->w/c. OT pushed pt->room. OT donned bilateral TED hose and introduced shoe horn for increased independence with LB dressing. Pt has a tendency to push through arms during sit<>stand breaking sternal precautions. OT attempted to talk to pt about alternate ways to complete sit<>stand in order to adhere to sternal precautions but pt stated she was completing sit<>stand the correct way. Will attempt to talk to primary OT about this. At end of tx session, pt seated in w/c with RT present.  Therapy Documentation Precautions:  Precautions Precautions: Knee;Sternal;Fall (DVT R calf) Precaution Comments: educated patient on sternal and knee precautions, will continue to enforce Restrictions Weight Bearing Restrictions: Yes RUE Weight Bearing: Non weight bearing LUE Weight Bearing: Non weight bearing RLE Weight Bearing: Weight bearing as tolerated Other Position/Activity Restrictions: Sternal precautions  See FIM for current  functional status  Therapy/Group: Individual Therapy  Zoei Amison 07/29/2012, 9:44 AM

## 2012-07-29 NOTE — Progress Notes (Signed)
Note reviewed and accurately reflects treatment session.   

## 2012-07-29 NOTE — Patient Care Conference (Signed)
Inpatient RehabilitationTeam Conference and Plan of Care Update Date: 07/28/2012   Time: 3:05 PM    Patient Name: Susan Welch      Medical Record Number: [id number]  Date of Birth: 26-Jul-1945 Sex: Female         Room/Bed: 4W02C/4W02C-01 Payor Info: Payor: [employer] [place] [employer] / Plan: BLUE MEDICARE / Product Type: *No Product type* /    Admitting Diagnosis: R TKR  CABG  Admit Date/Time:  07/24/2012  6:42 PM Admission Comments: No comment available   Primary Diagnosis:  Knee joint replacement by other means Principal Problem: Knee joint replacement by other means  Patient Active Problem List   Diagnosis Date Noted  . Knee joint replacement by other means 07/26/2012  . Osteoarthritis of right knee 07/16/2012  . HTN (hypertension) 09/20/2011  . Lipid disorder 09/20/2011    Expected Discharge Date: Expected Discharge Date: 07/31/12  Team Members Present: Physician leading conference: Dr. Faith Rogue Social Worker Present: Amada Jupiter, LCSW Nurse Present: Carlean Purl, RN PT Present: Other (comment) Corinna Capra., PT) OT Present: Roney Mans, Loistine Chance, OT;Ardis Rowan, COTA SLP Present: Feliberto Gottron, SLP     Current Status/Progress Goal Weekly Team Focus  Medical   right TKA, cardiac arrest on table. deconditioned, right knee dvt  stabilize medically for dc.   wean oxygen, increase knee rom   Bowel/Bladder   cont bowel/bladder  remain cont bowel/bladder  monitor for constipation/diarrhea, admin PRN senna as needed   Swallow/Nutrition/ Hydration             ADL's   superivision to mod I for basic ADL, still needs min cuing for sternal prec at [facility]omes,   mod I  overall  d/c planning, problem solving I at home.   Mobility   Pt currently req (S) for bed mob, transfers w/ RW and amb w/ RW (<150'); Pt req min guard/assist w/ curb step negotiation  Mod(I): dynamic sitting/standing balance, bed<>chair t/f, car t/f, amb in home/community; (I): bed mob   Functional mobility while maintaining sternal precautions, increasing functional amb endurance, increasing R knee ROM/strength   Communication             Safety/Cognition/ Behavioral Observations            Pain   occasional c/o pain/discomfort on R knee or chest; pain level around 5 at the most - PRN oxy 10mg  effective can bring down pain to level 0-1  pain level less than 3  assess pain qshift, before/after therapy, and PRN   Skin   R knee staples - clean, dry, OTA; L knee/leg sutures - clean, dry, OTA, one site with scant drainage - dry dsg applied; Sternal incision - CDI, steri strips upper abd  incisions heal with no infections; no new skin breakdown  monitor incisions qshit and PRN    Rehab Goals Patient on target to meet rehab goals: Yes *See Care Plan and progress notes for long and short-term goals.  Barriers to Discharge: stamina, cough, right knee rom    Possible Resolutions to Barriers:  cpm, rom therapy, adaptive equipment    Discharge Planning/Teaching Needs:  home alone @ mod I goals.  Family and friends to provide intermittent assist       Team Discussion:  Making excellent progress and much faster than originally anticipated!  Anticipate will meet mod i goals and be able to manage alone at home.  Trial of rollator planned.  No concerns.  Revisions to Treatment Plan:  None   Continued Need for Acute Rehabilitation Level of Care: The patient requires daily medical management by a physician with specialized training in physical medicine and rehabilitation for the following conditions: Daily direction of a multidisciplinary physical rehabilitation program to ensure safe treatment while eliciting the highest outcome that is of practical value to the patient.: Yes Daily medical management of patient stability for increased activity during participation in an intensive rehabilitation regime.: Yes Daily analysis of laboratory values and/or radiology reports with any  subsequent need for medication adjustment of medical intervention for : Post surgical problems;Cardiac problems  Dwight Adamczak 07/29/2012, 11:30 AM

## 2012-07-29 NOTE — Progress Notes (Signed)
Subjective/Complaints: Had some coughing last night which kept her up a couple hours. Overall feeling better. Staples out. A 12 point review of systems has been performed and if not noted above is otherwise negative.   Objective: Vital Signs: Blood pressure 119/76, pulse 95, temperature 97.5 F (36.4 C), temperature source Oral, resp. rate 17, weight 68.5 kg (151 lb 0.2 oz), SpO2 90.00%. No results found.  Recent Labs  07/27/12 0605  WBC 10.2  HGB 9.4*  HCT 28.6*  PLT 460*    Recent Labs  07/27/12 0605  NA 137  K 3.8  CL 98  GLUCOSE 99  BUN 18  CREATININE 0.67  CALCIUM 9.1   CBG (last 3)  No results found for this basename: GLUCAP,  in the last 72 hours  Wt Readings from Last 3 Encounters:  07/29/12 68.5 kg (151 lb 0.2 oz)  07/24/12 75.388 kg (166 lb 3.2 oz)  07/24/12 75.388 kg (166 lb 3.2 oz)    Physical Exam:  Constitutional: She is oriented to person, place, and time.  HENT: oral mucosa pink and moist, dentition good  Head: Normocephalic.  Eyes: EOM are normal.  Neck: Normal range of motion. Neck supple. No thyromegaly present.  Cardiovascular:  Cardiac rate control. With reg rhythm, no murmurs  Pulmonary/Chest: Breath sounds normal. No respiratory distress. Oc exp wheezes, rales. Limited expansion due to chest discomfort  Abdominal: Soft. Bowel sounds are normal. She exhibits no distension.  Neurological: She is alert and oriented to person, place, and time.  Follows full commands. Cognitively within normal limits. CN 2-12 normal. Sensory exam intact. Motor 4+/5 bilateral UE's. LLE 3-4 hf to 4/5 at ankle. RLE is 2/5 HF, 1+ KE, 4/5 at ankle.  Musc: knee rom 70 deg+ flexion, 1+ edema about the knee Skin:  Midline chest incision healing. Right knee incision with steristrips. LLE with multiple ecchymoses and incisions from harvest sites. One incision site opened with mild sero-sanginous drainage  Psychiatric: She has a normal mood and  affect   Assessment/Plan: 1. Functional deficits secondary to right TKA with intraoperative cardiac arrest/ subsequent CABG which require 3+ hours per day of interdisciplinary therapy in a comprehensive inpatient rehab setting. Physiatrist is providing close team supervision and 24 hour management of active medical problems listed below. Physiatrist and rehab team continue to assess barriers to discharge/monitor patient progress toward functional and medical goals.  Working toward Friday dc   FIM: FIM - Bathing Bathing Steps Patient Completed: Chest;Right Arm;Left Arm;Abdomen;Right upper leg;Left upper leg Bathing: 3: Mod-Patient completes 5-7 25f 10 parts or 50-74% (per OT notes assist with lower body)  FIM - Upper Body Dressing/Undressing Upper body dressing/undressing steps patient completed: Thread/unthread right bra strap;Thread/unthread left bra strap;Hook/unhook bra;Thread/unthread right sleeve of pullover shirt/dresss;Thread/unthread left sleeve of pullover shirt/dress;Put head through opening of pull over shirt/dress;Pull shirt over trunk Upper body dressing/undressing: 5: Set-up assist to: Obtain clothing/put away (per OT report) FIM - Lower Body Dressing/Undressing Lower body dressing/undressing steps patient completed: Thread/unthread right underwear leg;Thread/unthread left underwear leg;Pull underwear up/down;Thread/unthread right pants leg;Thread/unthread left pants leg;Pull pants up/down;Fasten/unfasten pants;Don/Doff right sock;Don/Doff left sock;Don/Doff right shoe;Don/Doff left shoe Lower body dressing/undressing: 4: Steadying Assist (per OT report)  FIM - Toileting Toileting steps completed by patient: Adjust clothing prior to toileting;Performs perineal hygiene;Adjust clothing after toileting Toileting Assistive Devices: Grab bar or rail for support Toileting: 5: Supervision: Safety issues/verbal cues  FIM - Diplomatic Services operational officer Devices: Grab  bars Toilet Transfers: 5-To toilet/BSC: Supervision (verbal cues/safety issues)  FIM - Banker Devices: HOB elevated Bed/Chair Transfer: 5: Supine > Sit: Supervision (verbal cues/safety issues);5: Bed > Chair or W/C: Supervision (verbal cues/safety issues);5: Chair or W/C > Bed: Supervision (verbal cues/safety issues)  FIM - Locomotion: Wheelchair Locomotion: Wheelchair: 1: Total Assistance/staff pushes wheelchair (Pt<25%) FIM - Locomotion: Ambulation Locomotion: Ambulation Assistive Devices: Designer, industrial/product Ambulation/Gait Assistance: 5: Supervision Locomotion: Ambulation: 5: Travels 150 ft or more with supervision/safety issues  Comprehension Comprehension Mode: Auditory Comprehension: 7-Follows complex conversation/direction: With no assist  Expression Expression Mode: Verbal Expression: 7-Expresses complex ideas: With no assist  Social Interaction Social Interaction: 7-Interacts appropriately with others - No medications needed.  Problem Solving Problem Solving: 7-Solves complex problems: Recognizes & self-corrects  Memory Memory: 7-Complete Independence: No helper  Medical Problem List and Plan:  1. Right total knee arthroplasty for degenerative joint disease with intraoperative cardiac arrest/status post CABG x4 07/21/2012   -removed staples  -continue sutures LLE, steristrip over one open area 2. DVT Prophylaxis/Anticoagulation: -lovenox to coumadin per pharmacy (appreciate their help)  -mobilize as tolerated for post-tib and peroneal dvt 3. Pain Management: Oxycodone as needed. Monitor with increased mobility  4. Neuropsych: This patient is capable of making decisions on her own behalf.  5. Acute blood loss anemia. Followup CBC/continue Niferex  6. Hypertension. Lasix 40 mg daily, lisinopril 2.5 mg daily, Lopressor 12.5 mg twice a day. Normotensive at present 7. Hyperlipidemia. Crestor 8. Cough--robitussin, IS, OOB  LOS  (Days) 5 A FACE TO FACE EVALUATION WAS PERFORMED  Dulcie Gammon T 07/29/2012 7:58 AM

## 2012-07-29 NOTE — Progress Notes (Signed)
Physical Therapy Note  Patient Details  Name: Ardeth Repetto MRN: [id number] Date of Birth: Feb 22, 1945 Today's Date: 07/29/2012  1515-1545 (30 minutes) individual Pain: no reported c/o pain Focus of treatment: therapeutic exercise focused on AROM LT knee; gait training/endurance Treatment: Gait 150 feet x 2 rollator SBA ; seated RT knee flexion using green theraband; sit to stand from mat X 5 with arms crossed for quad strengthening.    Donaldson Richter,JIM 07/29/2012, 3:57 PM

## 2012-07-29 NOTE — Progress Notes (Signed)
Physical Therapy Session Note  Patient Details  Name: Susan Welch MRN: [id number] Date of Birth: January 19, 1945  Today's Date: 07/29/2012 Time: 2130-8657 Time Calculation (min): 60 min  Short Term Goals: Week 1:  PT Short Term Goal 1 (Week 1): STG=LTG  Skilled Therapeutic Interventions/Progress Updates:    Pt performed gait training with use of 4ww (rollator) on various surfaces outside , level and uneven, up and down ramps and slopes with supervision assist and up and down curbs with min assist with pt requiring help to position 4ww due to sternal precautions. Pt required 3 -4 seated rests. Up and down stairs outside with use of L UE and min assist to complete due to vcs for sequencing as pt gets slightly confused as to which limb to lead with, review of written instructions provided earlier today for good LE up and bad LE down or per pt new leg down and old leg up.  Nu-step to improve activity tolerance and R LE ROM x 5 mins at L 2 with B LEs only.  Pt very pleasant and motivated.   Therapy Documentation Precautions:  Precautions Precautions: Knee;Sternal;Fall (DVT R calf) Precaution Comments: educated patient on sternal and knee precautions, will continue to enforce Restrictions Weight Bearing Restrictions: Yes RUE Weight Bearing: Non weight bearing LUE Weight Bearing: Non weight bearing RLE Weight Bearing: Weight bearing as tolerated Other Position/Activity Restrictions: Sternal precautions Therapy vitals:  SpO2 on room air 92% with exertion increases to 96 % with deep breathing techniques.      Pain: Pain Assessment Pain Assessment: 0-10 Pain Score: 2  Pain Type: Surgical pain Pain Location: Sternum Pain Orientation: Mid Pain Descriptors / Indicators: Sore Pain Frequency: Occasional Pain Onset: Gradual Patients Stated Pain Goal: 2 Pain Intervention(s): Medication (See eMAR) Multiple Pain Sites: No    Locomotion : Ambulation Ambulation/Gait Assistance: 5: Supervision                 See FIM for current functional status  Therapy/Group: Individual Therapy  Jackelyn Knife 07/29/2012, 3:41 PM

## 2012-07-29 NOTE — Progress Notes (Signed)
Recreational Therapy Session Note  Patient Details  Name: Susan Welch MRN: [id number] Date of Birth: 1945/06/11 Today's Date: 07/29/2012 Time:  [phone] Pain: no c/o Skilled Therapeutic Interventions/Progress Updates: session focused on education about community reintegration including identification & negotiation of obstacles, safety concerns, & energy conservation.  Pt stated understanding and participated in discussion with min questioning cues.  Therapy/Group: Individual Therapy  Pietro Bonura 07/29/2012, 4:05 PM

## 2012-07-29 NOTE — Plan of Care (Signed)
Problem: Food- and Nutrition-Related Knowledge Deficit (NB-1.1) Goal: Nutrition education Formal process to instruct or train a patient/client in a skill or to impart knowledge to help patients/clients voluntarily manage or modify food choices and eating behavior to maintain or improve health. Outcome: Completed/Met Date Met:  07/29/12 Nutrition Education Note  RD consulted for nutrition education regarding a Heart Healthy diet.   Lipid Panel     Component Value Date/Time    CHOL 207* 07/16/2012 0430    TRIG 149 07/16/2012 0430    HDL 50 07/16/2012 0430    CHOLHDL 4.1 07/16/2012 0430    VLDL 30 07/16/2012 0430    LDLCALC 127* 07/16/2012 0430    RD provided "Heart Healthy Nutrition Therapy" handout from the Academy of [employer]. Reviewed patient's dietary recall. Provided examples on ways to decrease sodium and fat intake in diet. Discouraged intake of processed foods and use of salt shaker. Encouraged fresh fruits and vegetables as well as whole grain sources of carbohydrates to maximize fiber intake. Teach back method used. Pt with appropriate questions which are answered.  Discussed specific examples on stocking fridge and pantry during rehab process.  Expect good compliance.  Body mass index is 28.07 kg/(m^2). Pt meets criteria for overweight based on current BMI.  Current diet order is Regular, patient is consuming approximately 75% of meals at this time. Labs and medications reviewed. No further nutrition interventions warranted at this time. RD contact information provided. If additional nutrition issues arise, please re-consult RD.  Loyce Dys, MS RD LDN Clinical Inpatient Dietitian Pager: [phone] Weekend/After hours pager: [phone]

## 2012-07-30 ENCOUNTER — Inpatient Hospital Stay (HOSPITAL_COMMUNITY): Payer: Medicare Other | Admitting: Occupational Therapy

## 2012-07-30 ENCOUNTER — Inpatient Hospital Stay (HOSPITAL_COMMUNITY): Payer: Medicare Other

## 2012-07-30 ENCOUNTER — Ambulatory Visit: Payer: Medicare Other | Admitting: Family Medicine

## 2012-07-30 ENCOUNTER — Encounter (HOSPITAL_COMMUNITY): Payer: Medicare Other | Admitting: Occupational Therapy

## 2012-07-30 LAB — PROTIME-INR: Prothrombin Time: 22.4 seconds — ABNORMAL HIGH (ref 11.6–15.2)

## 2012-07-30 MED ORDER — WARFARIN SODIUM 6 MG PO TABS
6.0000 mg | ORAL_TABLET | Freq: Once | ORAL | Status: AC
  Administered 2012-07-30: 6 mg via ORAL
  Filled 2012-07-30: qty 1

## 2012-07-30 NOTE — Progress Notes (Signed)
Subjective/Complaints: Feeling well. No complaints.  A 12 point review of systems has been performed and if not noted above is otherwise negative.   Objective: Vital Signs: Blood pressure 119/71, pulse 94, temperature 98.5 F (36.9 C), temperature source Oral, resp. rate 18, weight 68 kg (149 lb 14.6 oz), SpO2 92.00%. No results found. No results found for this basename: WBC, HGB, HCT, PLT,  in the last 72 hours No results found for this basename: NA, K, CL, CO, GLUCOSE, BUN, CREATININE, CALCIUM,  in the last 72 hours CBG (last 3)  No results found for this basename: GLUCAP,  in the last 72 hours  Wt Readings from Last 3 Encounters:  07/30/12 68 kg (149 lb 14.6 oz)  07/24/12 75.388 kg (166 lb 3.2 oz)  07/24/12 75.388 kg (166 lb 3.2 oz)    Physical Exam:  Constitutional: She is oriented to person, place, and time.  HENT: oral mucosa pink and moist, dentition good  Head: Normocephalic.  Eyes: EOM are normal.  Neck: Normal range of motion. Neck supple. No thyromegaly present.  Cardiovascular:  Cardiac rate control. With reg rhythm, no murmurs  Pulmonary/Chest: Breath sounds normal. No respiratory distress. Oc exp wheezes, rales. Limited expansion due to chest discomfort  Abdominal: Soft. Bowel sounds are normal. She exhibits no distension.  Neurological: She is alert and oriented to person, place, and time.  Follows full commands. Cognitively within normal limits. CN 2-12 normal. Sensory exam intact. Motor 4+/5 bilateral UE's. LLE 3-4 hf to 4/5 at ankle. RLE is 2/5 HF, 1+ KE, 4/5 at ankle.  Musc: knee rom 70 deg+ flexion, 1+ edema about the knee Skin:  Midline chest incision healing. Right knee incision with steristrips. LLE with multiple ecchymoses and incisions from harvest sites. One incision site opened with mild sero-sanginous drainage  Psychiatric: She has a normal mood and affect   Assessment/Plan: 1. Functional deficits secondary to right TKA with intraoperative cardiac  arrest/ subsequent CABG which require 3+ hours per day of interdisciplinary therapy in a comprehensive inpatient rehab setting. Physiatrist is providing close team supervision and 24 hour management of active medical problems listed below. Physiatrist and rehab team continue to assess barriers to discharge/monitor patient progress toward functional and medical goals.  Working toward Friday dc. Discussed dc planning today.   FIM: FIM - Bathing Bathing Steps Patient Completed: Chest;Right Arm;Left Arm;Abdomen;Buttocks;Front perineal area;Left upper leg;Right upper leg;Left lower leg (including foot);Right lower leg (including foot) Bathing: 6: Assistive device (Comment)  FIM - Upper Body Dressing/Undressing Upper body dressing/undressing steps patient completed: Thread/unthread right sleeve of pullover shirt/dresss;Thread/unthread left sleeve of pullover shirt/dress;Put head through opening of pull over shirt/dress;Pull shirt over trunk Upper body dressing/undressing: 7: Complete Independence: No helper FIM - Lower Body Dressing/Undressing Lower body dressing/undressing steps patient completed: Thread/unthread right underwear leg;Thread/unthread left underwear leg;Pull underwear up/down;Thread/unthread right pants leg;Thread/unthread left pants leg;Pull pants up/down;Fasten/unfasten pants;Don/Doff right sock;Don/Doff left sock;Don/Doff right shoe;Don/Doff left shoe Lower body dressing/undressing: 5: Supervision: Safety issues/verbal cues  FIM - Toileting Toileting steps completed by patient: Adjust clothing prior to toileting;Performs perineal hygiene;Adjust clothing after toileting Toileting Assistive Devices: Grab bar or rail for support Toileting: 7: Independent: No helper, no device  FIM - Diplomatic Services operational officer Devices: Therapist, music Transfers: 7-Independent: No helper  FIM - Banker Devices: Environmental consultant (rollator) Bed/Chair  Transfer: 6: Supine > Sit: No assist;5: Bed > Chair or W/C: Supervision (verbal cues/safety issues)  FIM - Locomotion: Wheelchair Locomotion: Wheelchair: 0: Activity did  not occur FIM - Locomotion: Ambulation Locomotion: Ambulation Assistive Devices: Other (comment) (4ww) Ambulation/Gait Assistance: 5: Supervision Locomotion: Ambulation: 5: Travels 150 ft or more with supervision/safety issues  Comprehension Comprehension Mode: Auditory Comprehension: 5-Understands complex 90% of the time/Cues < 10% of the time  Expression Expression Mode: Verbal Expression: 7-Expresses complex ideas: With no assist  Social Interaction Social Interaction: 7-Interacts appropriately with others - No medications needed.  Problem Solving Problem Solving: 6-Solves complex problems: With extra time  Memory Memory: 6-More than reasonable amt of time  Medical Problem List and Plan:  1. Right total knee arthroplasty for degenerative joint disease with intraoperative cardiac arrest/status post CABG x4 07/21/2012   -removed staples  -continue sutures LLE, steristrip over one open area 2. DVT Prophylaxis/Anticoagulation: -lovenox to coumadin per pharmacy (appreciate their help)  -mobilize as tolerated for post-tib and peroneal dvt 3. Pain Management: Oxycodone as needed. Monitor with increased mobility  4. Neuropsych: This patient is capable of making decisions on her own behalf.  5. Acute blood loss anemia. Followup CBC/continue Niferex  6. Hypertension. Lasix 40 mg daily, lisinopril 2.5 mg daily, Lopressor 12.5 mg twice a day. Normotensive at present 7. Hyperlipidemia. Crestor 8. Cough--robitussin, IS, OOB  LOS (Days) 6 A FACE TO FACE EVALUATION WAS PERFORMED  Magin Balbi T 07/30/2012 7:28 AM

## 2012-07-30 NOTE — Progress Notes (Signed)
ANTICOAGULATION CONSULT NOTE - Follow Up Consult  Pharmacy Consult for Warfarin/Lovenox Indication: RLE DVT  Allergies  Allergen Reactions  . Statins Other (See Comments)    Joint pain  . Nickel Rash    Patient Measurements: Weight: 149 lb 14.6 oz (68 kg)  Vital Signs: Temp: 98.9 F (37.2 C) (07/24 0801) Temp src: Oral (07/24 0801) BP: 125/78 mmHg (07/24 0801) Pulse Rate: 101 (07/24 0801)  Labs:  Recent Labs  07/28/12 0730 07/29/12 0550 07/30/12 0710  LABPROT 15.9* 20.1* 22.4*  INR 1.30 1.77* 2.04*    The CrCl is unknown because both a height and weight (above a minimum accepted value) are required for this calculation.   Assessment: 67 y.o. F s/p recent R-TKR on 7/9 and CABG x 4 on 07/21/12 and transferred to IR on 7/18. Dopplers confirmed RLE DVT on 7/19, currently on D#6 lovenox bridge to therapeutic coumadin. INR now at goal.  No overt s/sx of bleeding noted per chart.  Goal of Therapy:  INR 2-3 Anti-Xa level 0.6-1.2 units/ml 4hrs after LMWH dose given Monitor platelets by anticoagulation protocol: Yes   Plan:  1. DC Lovenox  2. Warfarin 6 mg x 1 dose at 1800 today 3. Will continue to monitor for any signs/symptoms of bleeding and will follow up with PT/INR in the a.m.   Wendie Simmer, PharmD, BCPS Clinical Pharmacist  Pager: [phone]

## 2012-07-30 NOTE — Discharge Summary (Signed)
  Discharge summary job # [id number]

## 2012-07-30 NOTE — Discharge Summary (Signed)
Susan Welch, Susan Welch NO.:  [id number]  MEDICAL RECORD NO.:  [id number]  LOCATION:  [id number]                        FACILITY:  [facility]  PHYSICIAN:  Ranelle Oyster, M.D.DATE OF BIRTH:  [birth year]  DATE OF ADMISSION:  07/24/2012 DATE OF DISCHARGE:  07/31/2012                              DISCHARGE SUMMARY   DISCHARGE DIAGNOSES: 1. Right total knee arthroplasty with intraoperative cardiac arrest,     status post coronary artery bypass grafting on July 21, 2012. 2. Acute deep vein thrombosis, right posterior tibial vein and right     peroneal vein. 3. Pain management. 4. Acute blood loss anemia. 5. Hypertension. 6. Hyperlipidemia.  HISTORY OF PRESENT ILLNESS:  This is a 67 year old right-handed female, independent prior to admission, working full time, who was admitted on July 14, 2012, with end-stage degenerative changes, right knee and underwent total knee replacement on  July 15, 2012, per Dr. Eulah Pont.  The patient with intraoperative cardiac arrest, was resuscitated with Cardiology Services consulted.  EKG showed inferior wall myocardial infarction.  Cardiac catheterization showed severe 3-vessel coronary artery disease with total occlusion of right coronary artery.  Underwent coronary artery bypass grafting x4 on July 21, 2012, per Dr. Tyrone Sage. Maintained on subcutaneous Lovenox for DVT prophylaxis.  HOSPITAL COURSE:  Acute blood loss anemia 8.0 and monitored.  Patient weightbearing as tolerated, right lower extremity and knee replacement as well as sternal precautions after bypass grafting.  The patient was admitted for comprehensive rehab program.  PAST MEDICAL HISTORY:  See discharge diagnoses.  SOCIAL HISTORY:  Lives alone.  FUNCTIONAL HISTORY:  Prior to admission, independent.  FUNCTIONAL STATUS:  Upon admission to rehab services was minimal assist to ambulate 40 feet, pushing a wheelchair.  PHYSICAL EXAMINATION:  VITAL SIGNS:  Blood pressure  139/77, pulse 99, temperature 98.7 respirations 20. GENERAL:  This was an alert female, in no acute distress, oriented x3. HEENT:  Pupils were round and reactive to light. LUNGS:  Clear to auscultation. CARDIAC:  Regular rate and rhythm. ABDOMEN:  Soft, nontender.  Good bowel sounds.  Right knee replacement site clean and dry.  Sternal precautions with chest incision healing.  REHABILITATION HOSPITAL COURSE:  The patient was admitted to inpatient rehab services with therapies initiated on a 3-hour daily basis consisting of physical therapy, occupational therapy, and rehabilitation nursing.  The following issues were addressed during the patient's rehabilitation stay.  Pertaining to Susan Welch's right total knee arthroplasty, surgical site healing nicely, staples removed. Weightbearing as tolerated.  She would follow up with Orthopedic Services.  Intraoperative cardiac arrest with coronary artery bypass grafting x4 on July 21, 2012, per Dr. Tyrone Sage.  The patient was sternal precautions.  Chest incision healed.  She had no chest pain or shortness of breath.  The patient had been maintained on subcutaneous Lovenox for DVT prophylaxis with routine venous vascular studies completed upon admission to rehab services on July 25, 2012, that showed right posterior tibial vein and right peroneal vein DVT, thus with these findings, Coumadin therapy was initiated with subcutaneous Lovenox to INR greater than 2 with no bleeding episodes.  The patient would continue on Coumadin therapy for approximately 2 months and follow up vascular  studies.  Patient's primary MD, Dr. Dow Adolph, [phone], was contacted in follow up patient's Coumadin and home health nurse had been arranged.  Pain management ongoing with the use of oxycodone and good results.  Blood pressure is well controlled on lisinopril and Lopressor with no orthostatic changes.  Acute blood loss anemia, latest hemoglobin 9.4.  The  patient remained on iron supplement. The patient received weekly collaborative interdisciplinary team conferences to discuss estimated length of stay, family teaching, and any barriers to discharge.  She was continent of bowel and bladder, supervision to modified independence for basic activities of daily living.  Still needed some minimal cues for sternal precautions. Supervision with bed mobility, ambulating 150 feet with a rolling walker.  The patient is to be discharged to home with ongoing therapies dictated per rehab services.  DISCHARGE MEDICATIONS: 1. Aspirin 325 mg p.o. daily. 2. Folic acid 1 mg daily. 3. Niferex 150 mg p.o. daily. 4. Lisinopril 2.5 mg p.o. daily. 5. Lopressor 25 mg p.o. b.i.d. 6. Oxycodone immediate release 5-10 mg every 3 hours as needed for     pain. 7. Protonix 40 mg daily. 8. Crestor 5 mg p.o. daily. 9. Coumadin latest dose of 6 mg, adjusted accordingly for an INR of     2.0 to 3.0.  DIET:  Regular.  SPECIAL INSTRUCTIONS:  Weightbearing as tolerated to right lower extremity.  Sternal precautions after bypass grafting.  Home health nurse arranged to check INR on Tuesday August 04, 2012, results to Dr. Audria Nine at [phone], fax [phone].  Patient follow up with Dr. Mckinley Jewel, Orthopedic Services 2 weeks call for appointment and Dr. Tyrone Sage, Cardiothoracic Surgery, 1 month, and Dr. Faith Rogue, the outpatient rehab service office as needed.     Susan Welch, P.A.   ______________________________ Ranelle Oyster, M.D.    DA/MEDQ  D:  07/30/2012  T:  07/30/2012  Job:  [id number]  cc:   Ranelle Oyster, M.D. Sheliah Plane, MD Loreta Ave, M.D. Maurice March, M.D.

## 2012-07-30 NOTE — Progress Notes (Signed)
Recreational Therapy Discharge Summary Patient Details  Name: Susan Welch MRN: [id number]  Date of Birth: 1945/01/25 Today's Date: 07/30/2012  Long term goals set: 1  Long term goals met: 1  Comments on progress toward goals: Pt has made great progress toward goals during ELOS and is ready for discharge home at Mod I level. TR session focused on education pertaining to use of leisure time post discharge and community pursuits.  Pt is able to identify potential obstacles to the above and ways to negotiate them.  Also after discussion pt is able to identify energy conservation techniques to utilize both at home and in the community to increase her safety & independence. Reasons for discharge: discharge from hospital  Patient/family agrees with progress made and goals achieved: Yes  Jennifr Gaeta 07/30/2012, 8:12 AM

## 2012-07-30 NOTE — Progress Notes (Signed)
Occupational Therapy Progress Notes and Discharge Summary  Patient Details  Name: Susan Welch MRN: [id number] Date of Birth: September 21, 1945  Today's Date: 07/30/2012 Time: 9147-8295 Time Calculation (min): 55 min GRAD DAY! Performed self care tasks at shower level mod I using Rolator to get around room and bathroom. Dicussed d/c planning, DME safety, importance of maintaining sternal precautions with functional tasks for a longer time frame even if pain not an indicator of healing.  1100-1130 1:1 no c/o pain  Therapeutic Exercise: focus on functional ambulation for endurance/ activity tolerance, knee flexion to 80-90 degrees sitting on EOM with maxi slide underneath foot for easier glide, knee flexion (lifting foot off floor) for quad strengthening, sit to stand with positioning right foot underneath her knee rather than guarding her LE. Pt able to perform all tasks with some report of tightness.  1345-1215(4min) 1:1 Activity tolerance to ambulation from room to apartment to engaged in simple meal prep task of making cookies with problem solving, maneuvering around kitchen using energy conservation techniques, and d/c planning regarding meals.   Patient has met 12 of 12 long term goals due to improved activity tolerance, improved balance, postural control and ability to compensate for deficits.  Patient to discharge at overall Modified Independent level for basic mobility with Rolator for basic ADLs.  Patient's care partner is independent to provide the necessary IADL tasks assistance at discharge.    Reasons goals not met: n/a  Recommendation:  No f/u OT needed  Equipment: Rolator and tub bench  Reasons for discharge: treatment goals met and discharge from hospital  Patient/family agrees with progress made and goals achieved: Yes  OT Discharge Precautions/Restrictions  Precautions Precautions: Knee;Sternal Precaution Comments:   Restrictions Weight Bearing Restrictions: Yes RLE  Weight Bearing: Weight bearing as tolerated  Pain Pain Assessment Pain Assessment: No/denies pain Pain Score: 0-No pain Pain Type: Surgical pain Pain Location: Sternum Pain Orientation: Mid;Left Pain Descriptors / Indicators: Discomfort Pain Frequency: Intermittent (upon activity) Pain Onset: Gradual Patients Stated Pain Goal: 2 ADL ADL Grooming: Setup Where Assessed-Grooming: Sitting at sink Upper Body Bathing: Setup Where Assessed-Upper Body Bathing: Shower Lower Body Bathing: Moderate assistance Where Assessed-Lower Body Bathing: Shower Upper Body Dressing: Setup Where Assessed-Upper Body Dressing: Chair Lower Body Dressing: Minimal assistance Where Assessed-Lower Body Dressing: Wheelchair Toileting: Minimal assistance Where Assessed-Toileting: Teacher, adult education: Moderate assistance Toilet Transfer Method: Stand pivot Tub/Shower Equipment: Restaurant manager, fast food: Moderate assistance Film/video editor Method: Warden/ranger: Emergency planning/management officer Vision/Perception  Vision - History Baseline Vision: Wears glasses all the time Vision - Assessment Eye Alignment: Within Chemical engineer Perception: Within Functional Limits Praxis Praxis: Intact  Cognition Overall Cognitive Status: Within Functional Limits for tasks assessed Orientation Level: Oriented X4 Safety/Judgment: Appears intact Sensation Sensation Light Touch: Appears Intact Coordination Gross Motor Movements are Fluid and Coordinated: Yes Fine Motor Movements are Fluid and Coordinated: Yes Motor  Motor Motor: Within Functional Limits Mobility  Bed Mobility Supine to Sit: 6: Modified independent (Device/Increase time) Sit to Supine: 6: Modified independent (Device/Increase time) Transfers Sit to Stand: 6: Modified independent (Device/Increase time) Stand to Sit: 6: Modified independent (Device/Increase time)  Trunk/Postural  Assessment  Cervical Assessment Cervical Assessment: Within Functional Limits Thoracic Assessment Thoracic Assessment: Within Functional Limits Lumbar Assessment Lumbar Assessment: Within Functional Limits Postural Control Postural Control: Within Functional Limits  Balance Static Sitting Balance Static Sitting - Level of Assistance: 6: Modified independent (Device/Increase time) Dynamic Sitting Balance Dynamic Sitting - Level of  Assistance: 6: Modified independent (Device/Increase time) Static Standing Balance Static Standing - Level of Assistance: 6: Modified independent (Device/Increase time) Dynamic Standing Balance Dynamic Standing - Level of Assistance: 6: Modified independent (Device/Increase time) Extremity/Trunk Assessment RUE AROM (degrees) RUE Overall AROM Comments: sternal precautions LUE AROM (degrees) Overall AROM Left Upper Extremity:  (sternal precautions)  See FIM for current functional status  Roney Mans [facility] 07/30/2012, 9:55 AM

## 2012-07-30 NOTE — Progress Notes (Addendum)
Physical Therapy Discharge Summary  Patient Details  Name: Susan Welch MRN: [id number] Date of Birth: 09-Jul-1945  Today's Date: 07/30/2012 Time: Treatment Session 1: 1130-1200; Treatment Session 2: 4098-1191 Time Calculation (min): Treatment Session 1: 30 min; Treatment Session 2: 43 min  Patient has met 9 of 9 long term goals due to improved activity tolerance, improved balance, increased strength, increased range of motion and decreased pain.  Patient to discharge at an ambulatory level Modified Independent.   Patient's care partner is independent and will be able to provide the necessary physical assistance at discharge.  Reasons goals not met: Pt requires (S) for car transfer due to inability to manage car door 2/2 sternal precautions   Recommendation:  Patient will benefit from ongoing skilled PT services in home health setting to continue to advance safe functional mobility, address ongoing impairments in decreased ROM and strength in R knee, decreased functional endurance and minimize fall risk.  Equipment: Rollator  Reasons for discharge: treatment goals met  Patient/family agrees with progress made and goals achieved: Yes  PT Discharge Precautions/Restrictions Precautions Precautions: Knee;Sternal Restrictions Weight Bearing Restrictions: Yes RLE Weight Bearing: Weight bearing as tolerated  Pain Pain Assessment at both 1137 and 1304 Pain Assessment: No/denies pain Pain Score: 0-No pain  Vision/Perception  Vision - History Baseline Vision: Wears glasses all the time Patient Visual Report: No change from baseline Vision - Assessment Vision Assessment: Vision not tested Perception Perception: Within Functional Limits   Cognition Overall Cognitive Status: Within Functional Limits for tasks assessed Orientation Level: Oriented X4  Sensation Sensation Light Touch: Appears Intact Coordination Gross Motor Movements are Fluid and Coordinated: Yes Fine Motor  Movements are Fluid and Coordinated: Yes  Motor  Motor Motor: Within Functional Limits   Mobility Bed Mobility Bed Mobility: Supine to Sit;Sit to Supine Supine to Sit: 7: Independent Sit to Supine: 7: Independent Transfers Sit to Stand: 6: Modified independent (Device/Increase time) Stand to Sit: 6: Modified independent (Device/Increase time) Stand Pivot Transfers: 6: Modified independent (Device/Increase time)  Locomotion  Ambulation Ambulation: Yes Ambulation/Gait Assistance: 6: Modified independent (Device/Increase time) Ambulation Distance (Feet): 300 Feet Assistive device: Rollator Gait Gait: Yes Gait Pattern: Within Functional Limits Gait Pattern: Antalgic (R LE) Stairs / Additional Locomotion Stairs: Yes Stairs Assistance: 5: Supervision Stairs Assistance Details: Verbal cues for sequencing;Verbal cues for precautions/safety Stair Management Technique: One rail Right;One rail Left;Two rails Number of Stairs: 8 Ramp: 6: Modified independent (Device) Curb: 5: Firefighter Mobility: No   Trunk/Postural Assessment  Cervical Assessment Cervical Assessment: Within Functional Limits Thoracic Assessment Thoracic Assessment: Within Functional Limits Lumbar Assessment Lumbar Assessment: Within Functional Limits Postural Control Postural Control: Within Functional Limits   Balance Balance Balance Assessed: Yes Static Sitting Balance Static Sitting - Balance Support: No upper extremity supported;Feet supported Static Sitting - Level of Assistance: 7: Independent Dynamic Sitting Balance Dynamic Sitting - Balance Support: No upper extremity supported;Feet supported Dynamic Sitting - Level of Assistance: 7: Independent Static Standing Balance Static Standing - Balance Support: No upper extremity supported;Bilateral upper extremity supported Static Standing - Level of Assistance: 7: Independent;6: Modified independent (Device/Increase  time) Dynamic Standing Balance Dynamic Standing - Balance Support: Right upper extremity supported;Left upper extremity supported;No upper extremity supported Dynamic Standing - Level of Assistance: 7: Independent;6: Modified independent (Device/Increase time)  Extremity Assessment  RUE Assessment RUE Assessment: Exceptions to [facility] RUE AROM (degrees) RUE Overall AROM Comments: sternal precautions LUE Assessment LUE Assessment: Exceptions to WFL LUE AROM (degrees) Overall AROM Left Upper Extremity:  (  sternal precautions)  RLE Assessment RLE Assessment: Exceptions to [facility] RLE PROM (degrees) Right Knee Extension: 10 (lacking) Right Knee Flexion: 80 RLE Strength RLE Overall Strength Comments: Good functional strength, (+) SLR LLE Assessment LLE Assessment: Within Functional Limits  Skilled Therapeutic Interventions Treatment Session 1: 1:1 This tx session focused on pt's understanding and performance of R TKA HEP. Pt verbalized and demonstrated good understanding of as well as good tolerance to exercises. R knee ROM measurements taken. R knee flexion: 80deg; R knee extension: lacking 10deg  Treatment Session 2: 1:1 This tx session focused on reassessment of pt's functional mobility. Pt demonstrated (I)-Mod(I) for static and dynamic standing balance; (I) bed mobility in a standard bed; Mod(I) transfers including car and long distance amb (300'x2); (S) for 3" curb step and stair negotiation (8, 6" steps w/ single and bilateral rails) as pt intermittently req min v/c for safety and sequencing.   See FIM for current functional status  Denzil Hughes 07/30/2012, 3:31 PM

## 2012-07-31 DIAGNOSIS — IMO0002 Reserved for concepts with insufficient information to code with codable children: Secondary | ICD-10-CM

## 2012-07-31 DIAGNOSIS — I80299 Phlebitis and thrombophlebitis of other deep vessels of unspecified lower extremity: Secondary | ICD-10-CM

## 2012-07-31 DIAGNOSIS — I2510 Atherosclerotic heart disease of native coronary artery without angina pectoris: Secondary | ICD-10-CM

## 2012-07-31 DIAGNOSIS — Z96659 Presence of unspecified artificial knee joint: Secondary | ICD-10-CM

## 2012-07-31 DIAGNOSIS — M1710 Unilateral primary osteoarthritis, unspecified knee: Secondary | ICD-10-CM

## 2012-07-31 LAB — PROTIME-INR: INR: 2.27 — ABNORMAL HIGH (ref 0.00–1.49)

## 2012-07-31 MED ORDER — ROSUVASTATIN CALCIUM 5 MG PO TABS: 5.0000 mg | ORAL_TABLET | Freq: Every day | ORAL | Status: DC

## 2012-07-31 MED ORDER — LISINOPRIL 2.5 MG PO TABS: 2.5000 mg | ORAL_TABLET | Freq: Every day | ORAL | Status: DC

## 2012-07-31 MED ORDER — WARFARIN SODIUM 5 MG PO TABS: 5.0000 mg | ORAL_TABLET | Freq: Every day | ORAL | Status: DC

## 2012-07-31 MED ORDER — OXYCODONE HCL 5 MG PO TABS: 5.0000 mg | ORAL_TABLET | ORAL | Status: DC | PRN

## 2012-07-31 MED ORDER — FOLIC ACID 1 MG PO TABS: 1.0000 mg | ORAL_TABLET | Freq: Every day | ORAL | Status: DC

## 2012-07-31 MED ORDER — PANTOPRAZOLE SODIUM 40 MG PO TBEC: 40.0000 mg | DELAYED_RELEASE_TABLET | Freq: Every day | ORAL | Status: DC

## 2012-07-31 MED ORDER — POLYSACCHARIDE IRON COMPLEX 150 MG PO CAPS: 150.0000 mg | ORAL_CAPSULE | Freq: Every day | ORAL | Status: DC

## 2012-07-31 MED ORDER — METOPROLOL TARTRATE 25 MG PO TABS: 25.0000 mg | ORAL_TABLET | Freq: Two times a day (BID) | ORAL | Status: DC

## 2012-07-31 MED ORDER — WARFARIN SODIUM 5 MG PO TABS
5.0000 mg | ORAL_TABLET | Freq: Every day | ORAL | Status: DC
  Filled 2012-07-31: qty 1

## 2012-07-31 MED ORDER — ASPIRIN 325 MG PO TBEC: 325.0000 mg | DELAYED_RELEASE_TABLET | Freq: Every day | ORAL | Status: DC

## 2012-07-31 NOTE — Progress Notes (Signed)
Subjective/Complaints: No complaints. Excited to go home today A 12 point review of systems has been performed and if not noted above is otherwise negative.   Objective: Vital Signs: Blood pressure 119/68, pulse 76, temperature 98.2 F (36.8 C), temperature source Oral, resp. rate 18, weight 69.2 kg (152 lb 8.9 oz), SpO2 93.00%. No results found. No results found for this basename: WBC, HGB, HCT, PLT,  in the last 72 hours  Recent Labs  07/31/12 0558  CREATININE 0.67   CBG (last 3)  No results found for this basename: GLUCAP,  in the last 72 hours  Wt Readings from Last 3 Encounters:  07/31/12 69.2 kg (152 lb 8.9 oz)  07/24/12 75.388 kg (166 lb 3.2 oz)  07/24/12 75.388 kg (166 lb 3.2 oz)    Physical Exam:  Constitutional: She is oriented to person, place, and time.  HENT: oral mucosa pink and moist, dentition good  Head: Normocephalic.  Eyes: EOM are normal.  Neck: Normal range of motion. Neck supple. No thyromegaly present.  Cardiovascular:  Cardiac rate control. With reg rhythm, no murmurs  Pulmonary/Chest: Breath sounds normal. No respiratory distress. Oc exp wheezes, rales. Limited expansion due to chest discomfort  Abdominal: Soft. Bowel sounds are normal. She exhibits no distension.  Neurological: She is alert and oriented to person, place, and time.  Follows full commands. Cognitively within normal limits. CN 2-12 normal. Sensory exam intact. Motor 4+/5 bilateral UE's. LLE 3-4 hf to 4/5 at ankle. RLE is 2/5 HF, 1+ KE, 4/5 at ankle.  Musc: knee rom 80+ deg flexion, tr to 1+ edema about the knee Skin:  Midline chest incision healing. Right knee incision with steristrips. LLE with multiple ecchymoses and incisions from harvest sites. One incision site opened with mild sero-sanginous drainage still Psychiatric: She has a normal mood and affect   Assessment/Plan: 1. Functional deficits secondary to right TKA with intraoperative cardiac arrest/ subsequent CABG which  require 3+ hours per day of interdisciplinary therapy in a comprehensive inpatient rehab setting. Physiatrist is providing close team supervision and 24 hour management of active medical problems listed below. Physiatrist and rehab team continue to assess barriers to discharge/monitor patient progress toward functional and medical goals.  Working toward Friday dc. Discussed dc planning today.   FIM: FIM - Bathing Bathing Steps Patient Completed: Chest;Right Arm;Left Arm;Abdomen;Buttocks;Front perineal area;Left upper leg;Right upper leg;Left lower leg (including foot);Right lower leg (including foot) Bathing: 6: Assistive device (Comment)  FIM - Upper Body Dressing/Undressing Upper body dressing/undressing steps patient completed: Thread/unthread right sleeve of pullover shirt/dresss;Thread/unthread left sleeve of pullover shirt/dress;Put head through opening of pull over shirt/dress;Pull shirt over trunk Upper body dressing/undressing: 7: Complete Independence: No helper FIM - Lower Body Dressing/Undressing Lower body dressing/undressing steps patient completed: Thread/unthread right underwear leg;Thread/unthread left underwear leg;Pull underwear up/down;Thread/unthread right pants leg;Thread/unthread left pants leg;Pull pants up/down;Fasten/unfasten pants;Don/Doff right sock;Don/Doff left sock;Don/Doff right shoe;Don/Doff left shoe Lower body dressing/undressing: 5: Supervision: Safety issues/verbal cues  FIM - Toileting Toileting steps completed by patient: Adjust clothing prior to toileting;Performs perineal hygiene;Adjust clothing after toileting Toileting Assistive Devices: Grab bar or rail for support Toileting: 7: Independent: No helper, no device  FIM - Diplomatic Services operational officer Devices: Grab bars Toilet Transfers: 5-From toilet/BSC: Supervision (verbal cues/safety issues);5-To toilet/BSC: Supervision (verbal cues/safety issues)  FIM - Physiological scientist Devices: Therapist, occupational: 6: Assistive device: no helper;7: Independent: No helper;6: Supine > Sit: No assist;6: Sit > Supine: No assist;6: Bed > Chair or W/C:  No assist;6: Chair or W/C > Bed: No assist ((I) bed mob, mod(I) transfers)  FIM - Locomotion: Wheelchair Locomotion: Wheelchair: 0: Activity did not occur (Amb is primary means of mobility) FIM - Locomotion: Ambulation Locomotion: Ambulation Assistive Devices: Other (comment) Aeronautical engineer) Ambulation/Gait Assistance: 6: Modified independent (Device/Increase time) Locomotion: Ambulation: 6: Travels 150 ft or more with assistive device/no helper  Comprehension Comprehension Mode: Auditory Comprehension: 6-Follows complex conversation/direction: With extra time/assistive device  Expression Expression Mode: Verbal Expression: 7-Expresses complex ideas: With no assist  Social Interaction Social Interaction: 7-Interacts appropriately with others - No medications needed.  Problem Solving Problem Solving: 7-Solves complex problems: Recognizes & self-corrects  Memory Memory: 7-Complete Independence: No helper  Medical Problem List and Plan:  1. Right total knee arthroplasty for degenerative joint disease with intraoperative cardiac arrest/status post CABG x4 07/21/2012   -removed staples  -dc sutures LLE 2. DVT Prophylaxis/Anticoagulation: -lovenox to coumadin per pharmacy (appreciate their help)  -mobilize as tolerated for post-tib and peroneal dvt 3. Pain Management: Oxycodone as needed. Monitor with increased mobility  4. Neuropsych: This patient is capable of making decisions on her own behalf.  5. Acute blood loss anemia. Followup CBC/continue Niferex  6. Hypertension. Lasix 40 mg daily, lisinopril 2.5 mg daily, Lopressor 12.5 mg twice a day. Normotensive at present 7. Hyperlipidemia. Crestor 8. Cough--robitussin, IS, OOB  LOS (Days) 7 A FACE TO FACE EVALUATION WAS  PERFORMED  SWARTZ,ZACHARY T 07/31/2012 7:46 AM

## 2012-07-31 NOTE — Progress Notes (Signed)
       [address]       [name] [address]             [phone]               Subjective: Ready to go home.  No complaints.  Objective: Vital signs in last 24 hours: Patient Vitals for the past 24 hrs:  BP Temp Temp src Pulse Resp SpO2 Weight  07/31/12 0500 119/68 mmHg 98.2 F (36.8 C) Oral 76 18 93 % 152 lb 8.9 oz (69.2 kg)  07/30/12 2016 120/76 mmHg - - 76 - - -  07/30/12 1430 120/75 mmHg 98.3 F (36.8 C) Oral 99 17 95 % -   Current Weight  07/31/12 152 lb 8.9 oz (69.2 kg)     Intake/Output from previous day: 07/24 0701 - 07/25 0700 In: 960 [P.O.:960] Out: -     PHYSICAL EXAM:  Heart: RRR Lungs: Clear Wound: Sternal and left leg incisions healing well.  Left leg drain site with small amount serosanguinous drainage, resolving ecchymosis. Extremities: +bilaeral LE edema, L>R    Lab Results: CBC:No results found for this basename: WBC, HGB, HCT, PLT,  in the last 72 hours BMET:  Recent Labs  07/31/12 0558  CREATININE 0.67    PT/INR:  Recent Labs  07/31/12 0558  LABPROT 24.3*  INR 2.27*      Assessment/Plan: Doing well, sutures removed. Will arrange outpatient followup.   LOS: 7 days    Mikah Poss H 07/31/2012

## 2012-07-31 NOTE — Progress Notes (Signed)
Social Work  Discharge Note  The overall goal for the admission was met for:   Discharge location: Yes - home alone (intermittent help from family and friends)  Length of Stay: Yes - 7 days  Discharge activity level: Yes - modified independent  Home/community participation: Yes  Services provided included: MD, RD, PT, OT, RN, TR, Pharmacy and SW  Financial Services: Medicare (*Blue Medicare)  Follow-up services arranged: Home Health: RN, PT via [facility], DME: rollator walker, tub bench via [facility], Other: CPM through [facility] and Patient/Family has no preference for HH/DME agencies  Comments (or additional information):  Patient/Family verbalized understanding of follow-up arrangements: Yes  Individual responsible for coordination of the follow-up plan: patient  Confirmed correct DME delivered: Laria Grimmett 07/31/2012    Rosaline Ezekiel

## 2012-07-31 NOTE — Progress Notes (Signed)
Pt discharged to home with son at 69. Deatra Ina, PA, provided discharge instructions. Pt verbalized understanding. Susan Welch

## 2012-07-31 NOTE — Progress Notes (Signed)
Sutures to left leg removed and steri strips placed per order. Incision clean, dry, intact. Susan Welch

## 2012-08-03 ENCOUNTER — Telehealth: Payer: Self-pay | Admitting: Radiology

## 2012-08-03 ENCOUNTER — Ambulatory Visit (INDEPENDENT_AMBULATORY_CARE_PROVIDER_SITE_OTHER): Payer: Medicare Other | Admitting: Pharmacist

## 2012-08-03 DIAGNOSIS — I824Y9 Acute embolism and thrombosis of unspecified deep veins of unspecified proximal lower extremity: Secondary | ICD-10-CM | POA: Insufficient documentation

## 2012-08-03 DIAGNOSIS — Z7901 Long term (current) use of anticoagulants: Secondary | ICD-10-CM

## 2012-08-03 NOTE — Telephone Encounter (Signed)
Susan Welch called with PT/ INR results/ advised we are not managing coumadin and labs for this patient, suggested they try to call Dr Antoine Poche. To you FYI

## 2012-08-05 ENCOUNTER — Encounter: Payer: Medicare Other | Admitting: Physician Assistant

## 2012-08-07 ENCOUNTER — Ambulatory Visit: Payer: Medicare Other | Admitting: Family Medicine

## 2012-08-11 ENCOUNTER — Ambulatory Visit (INDEPENDENT_AMBULATORY_CARE_PROVIDER_SITE_OTHER): Payer: Medicare Other | Admitting: Pharmacist

## 2012-08-11 DIAGNOSIS — Z7901 Long term (current) use of anticoagulants: Secondary | ICD-10-CM

## 2012-08-17 ENCOUNTER — Telehealth (HOSPITAL_COMMUNITY): Payer: Self-pay | Admitting: Cardiac Rehabilitation

## 2012-08-17 NOTE — Telephone Encounter (Signed)
Phone call received from Dr. Eulah Pont, verbal order given"ok for pt to begin cardiac rehab with no orthopaedic restrictions" v.o. Dr. Suella Broad, RN

## 2012-08-18 ENCOUNTER — Ambulatory Visit (INDEPENDENT_AMBULATORY_CARE_PROVIDER_SITE_OTHER): Payer: Medicare Other | Admitting: Cardiovascular Disease

## 2012-08-18 DIAGNOSIS — Z7901 Long term (current) use of anticoagulants: Secondary | ICD-10-CM

## 2012-08-19 ENCOUNTER — Other Ambulatory Visit: Payer: Self-pay | Admitting: *Deleted

## 2012-08-19 DIAGNOSIS — I2510 Atherosclerotic heart disease of native coronary artery without angina pectoris: Secondary | ICD-10-CM

## 2012-08-20 ENCOUNTER — Encounter: Payer: Self-pay | Admitting: Cardiothoracic Surgery

## 2012-08-20 ENCOUNTER — Ambulatory Visit
Admission: RE | Admit: 2012-08-20 | Discharge: 2012-08-20 | Disposition: A | Discharge status: Home / Self Care | Payer: Medicare Other | Source: Ambulatory Visit | Attending: Cardiothoracic Surgery | Admitting: Cardiothoracic Surgery

## 2012-08-20 ENCOUNTER — Ambulatory Visit (INDEPENDENT_AMBULATORY_CARE_PROVIDER_SITE_OTHER): Payer: Self-pay | Admitting: Cardiothoracic Surgery

## 2012-08-20 VITALS — BP 146/93 | HR 87 | Resp 16 | Ht 62.0 in | Wt 143.0 lb

## 2012-08-20 DIAGNOSIS — Z951 Presence of aortocoronary bypass graft: Secondary | ICD-10-CM | POA: Insufficient documentation

## 2012-08-20 DIAGNOSIS — I2510 Atherosclerotic heart disease of native coronary artery without angina pectoris: Secondary | ICD-10-CM

## 2012-08-20 MED ORDER — TRAMADOL HCL 50 MG PO TABS: 50.0000 mg | ORAL_TABLET | Freq: Four times a day (QID) | ORAL | Status: DC | PRN

## 2012-08-20 NOTE — Patient Instructions (Addendum)
May start cardiac rehab See work release paper

## 2012-08-20 NOTE — Progress Notes (Signed)
[address]       [place] [address]             [phone]                  Taffany [facility]iser [facility] Medical Record #[id number] Date of Birth: 08-Aug-1945  Rollene Rotunda, MD Dow Adolph, MD  Chief Complaint:   PostOp Follow Up Visit 07/22/2012   PREOPERATIVE DIAGNOSES: Recent ventricular fibrillation arrest, three-  vessel coronary artery disease.  POSTOPERATIVE DIAGNOSES: Recent ventricular fibrillation arrest, three-  vessel coronary artery disease..  SURGICAL PROCEDURE: Coronary artery bypass grafting x4 with the left  internal mammary to the left anterior descending coronary artery,  reverse saphenous vein graft sequentially to the ramus intermediate and  first obtuse marginal, reversed saphenous vein graft to the distal right  coronary artery with left leg endo vein harvesting.  SURGEON: Sheliah Plane, MD   History of Present Illness:      Patient making excellent progress following urgent coronary artery bypass graft. She was originally admitted for right knee replacement. On induction of anesthesia she had a V. fib arrest. Subsequent cardiac catheterization showed significant coronary disease and she underwent coronary artery bypass grafting. She's now  3 weeks postop ambulating well. She is to start cardiac rehabilitation next week. She wants to return to part-time work which involves no heavy lifting next week.  While in inpatient rehabilitation venous Dopplers showed DVT in the right leg, the left leg was the vein harvest site. She was placed on Coumadin to treat the DVT, the Coumadin therapy is being monitored by Dr. Jenene Slicker office.   The patient notes that oxycodone makes her feel "dizzy headed". I prescribed Ultram for her.       History  Smoking status  . Former Smoker  . Quit date: 01/08/2000  Smokeless tobacco  . Not on file       Allergies  Allergen Reactions  . Statins Other (See Comments)    Joint pain  .  Nickel Rash    Current Outpatient Prescriptions  Medication Sig Dispense Refill  . aspirin 325 MG EC tablet Take 1 tablet (325 mg total) by mouth daily.  30 tablet  0  . calcium citrate-vitamin D (CITRACAL+D) 315-200 MG-UNIT per tablet Take 1 tablet by mouth daily.      . folic acid (FOLVITE) 1 MG tablet Take 1 tablet (1 mg total) by mouth daily.  30 tablet  1  . iron polysaccharides (NIFEREX) 150 MG capsule Take 1 capsule (150 mg total) by mouth daily.  30 capsule  1  . lisinopril (PRINIVIL,ZESTRIL) 2.5 MG tablet Take 1 tablet (2.5 mg total) by mouth daily.  30 tablet  1  . metoprolol tartrate (LOPRESSOR) 25 MG tablet Take 1 tablet (25 mg total) by mouth 2 (two) times daily.  60 tablet  1  . oxyCODONE (OXY IR/ROXICODONE) 5 MG immediate release tablet Take 1-2 tablets (5-10 mg total) by mouth every 3 (three) hours as needed.  60 tablet  0  . warfarin (COUMADIN) 5 MG tablet Take 1 tablet (5 mg total) by mouth daily at 6 PM.  30 tablet  1   No current facility-administered medications for this visit.       Physical Exam: BP 146/93  Pulse 87  Resp 16  Ht 5\' 2"  (1.575 m)  Wt 143 lb (64.864 kg)  BMI 26.15 kg/m2  SpO2 97%  General appearance: alert and cooperative Neurologic: intact Heart:  regular rate and rhythm, S1, S2 normal, no murmur, click, rub or gallop Lungs: clear to auscultation bilaterally Abdomen: soft, non-tender; bowel sounds normal; no masses,  no organomegaly Extremities: Right knee incision is healing well, the left leg endo- vein harvest site is also healing well she has minimal pedal edema Wound: Her sternum is stable and well healed Wounds:  Diagnostic Studies & Laboratory data:         Recent Radiology Findings: Dg Chest 2 View  08/20/2012   *RADIOLOGY REPORT*  Clinical Data: Status post CABG, some shortness of breath, follow- up  CHEST - 2 VIEW  Comparison: Portable chest x-ray of 07/23/2012  Findings: No pneumothorax is seen.  The lungs are better aerated  with improvement in basilar atelectasis.  A small left pleural effusion remains.  Cardiomegaly is stable.  IMPRESSION:  1.  Improved aeration.  No pneumothorax. 2.  Resolution of right pleural effusion, but a small left pleural effusion remains.   Original Report Authenticated By: Dwyane Dee, M.D.      Recent Labs: Lab Results  Component Value Date   WBC 10.2 07/27/2012   HGB 9.4* 07/27/2012   HCT 28.6* 07/27/2012   PLT 460* 07/27/2012   GLUCOSE 99 07/27/2012   CHOL 207* 07/16/2012   TRIG 149 07/16/2012   HDL 50 07/16/2012   LDLCALC 127* 07/16/2012   ALT 51* 07/27/2012   AST 40* 07/27/2012   NA 137 07/27/2012   K 3.8 07/27/2012   CL 98 07/27/2012   CREATININE 0.67 07/31/2012   BUN 18 07/27/2012   CO2 30 07/27/2012   TSH 1.789 07/15/2012   INR 1.9 08/18/2012   HGBA1C 5.8* 07/20/2012      Assessment / Plan:      Doing well post urgent coronary artery bypass grafting, after V. fib arrest during right knee replacement She continues on anticoagulation therapy for right leg DVT. Have allowed her to return to part-time work next week with no heavy lifting. I've encouraged her to enroll in cardiac rehabilitation in the very near future. I've not made a return appointment to see her as  she will be closely followed by cardiology, I would be glad to see her at her or cardiology request at any time   Breckon Reeves B 08/20/2012 1:23 PM

## 2012-08-26 ENCOUNTER — Ambulatory Visit (INDEPENDENT_AMBULATORY_CARE_PROVIDER_SITE_OTHER): Payer: Medicare Other | Admitting: Pharmacist

## 2012-08-26 ENCOUNTER — Encounter: Payer: Self-pay | Admitting: Physician Assistant

## 2012-08-26 ENCOUNTER — Ambulatory Visit (INDEPENDENT_AMBULATORY_CARE_PROVIDER_SITE_OTHER): Payer: Medicare Other | Admitting: Physician Assistant

## 2012-08-26 VITALS — BP 149/92 | HR 92 | Ht 62.0 in | Wt 142.0 lb

## 2012-08-26 DIAGNOSIS — I10 Essential (primary) hypertension: Secondary | ICD-10-CM

## 2012-08-26 DIAGNOSIS — I824Y9 Acute embolism and thrombosis of unspecified deep veins of unspecified proximal lower extremity: Secondary | ICD-10-CM

## 2012-08-26 DIAGNOSIS — I824Y1 Acute embolism and thrombosis of unspecified deep veins of right proximal lower extremity: Secondary | ICD-10-CM

## 2012-08-26 DIAGNOSIS — I255 Ischemic cardiomyopathy: Secondary | ICD-10-CM | POA: Insufficient documentation

## 2012-08-26 DIAGNOSIS — I2510 Atherosclerotic heart disease of native coronary artery without angina pectoris: Secondary | ICD-10-CM

## 2012-08-26 DIAGNOSIS — E785 Hyperlipidemia, unspecified: Secondary | ICD-10-CM

## 2012-08-26 DIAGNOSIS — Z7901 Long term (current) use of anticoagulants: Secondary | ICD-10-CM

## 2012-08-26 DIAGNOSIS — I6529 Occlusion and stenosis of unspecified carotid artery: Secondary | ICD-10-CM

## 2012-08-26 DIAGNOSIS — I2589 Other forms of chronic ischemic heart disease: Secondary | ICD-10-CM

## 2012-08-26 MED ORDER — METOPROLOL TARTRATE 50 MG PO TABS: 50.0000 mg | ORAL_TABLET | Freq: Two times a day (BID) | ORAL | Status: DC

## 2012-08-26 MED ORDER — ROSUVASTATIN CALCIUM 10 MG PO TABS: 10.0000 mg | ORAL_TABLET | Freq: Every day | ORAL | Status: DC

## 2012-08-26 MED ORDER — LISINOPRIL 2.5 MG PO TABS: 2.5000 mg | ORAL_TABLET | Freq: Every day | ORAL | Status: DC

## 2012-08-26 NOTE — Progress Notes (Signed)
[address], [address] [place], [place]  [address] Phone: [phone] Fax:  [phone]  Date:  08/26/2012   ID:  Susan Welch, DOB [birth year], MRN [id number]  PCP:  Dow Adolph, MD  Cardiologist:  Dr. Rollene Rotunda     History of Present Illness: Susan Welch is a 67 y.o. female who returns for f/u after a recent admission to the hospital for left TKR c/b intraoperative VFib arrest.  She was defibrillated x1. Intraoperative TEE demonstrated inferior HK. She was seen by cardiology and ultimately underwent cardiac catheterization. She ruled in for myocardial infarction. LHC demonstrated severe 3 vessel CAD. She was subsequently seen by cardiac surgery and underwent CABG 07/21/12 (LIMA-LAD, SVG-RI and OM1, SVG-distal RCA). Echo 07/19/12: EF 40-45%, basal to mid anteroseptal AK, distal anteroseptal HK, mild MR. Pre-CABG Dopplers 07/19/12: RCA 40-59%.  Postoperative course was fairly uneventful. She was discharged to inpatient rehabilitation. While there, she had routine venous Dopplers that demonstrated right posterior tibial vein and right peroneal vein DVT. She was placed on Coumadin.  Since d/c, she is doing well.  She continues to have chest soreness.  No significant dyspnea.  No syncope.  No fevers or cough.  No significant dyspnea.  She is due to start cardiac rehab soon.  She is not on a statin.    Labs (7/14):  K 3.8, Cr 0.67, ALT 51, Alb 2.7, LDL 127, Hgb 9.4, TSH 1.789  Wt Readings from Last 3 Encounters:  08/26/12 142 lb (64.411 kg)  08/20/12 143 lb (64.864 kg)  07/31/12 152 lb 8.9 oz (69.2 kg)     Past Medical History  Diagnosis Date  . Arthritis     a. bilat knees s/p L TKA 07/15/2012  . Anemia   . Hypertension   . Anxiety     Hx: of situational anxiety  . Headache(784.0)     Hx: of migraines  . Hyperlipidemia   . Panic attacks   . Ventricular fibrillation     a. 07/15/2012 in setting of R TKA  . Ischemic cardiomyopathy     Echo 07/19/12: EF 40-45%,  basal to mid anteroseptal AK, distal anteroseptal HK, mild MR.   . Coronary artery disease     a. s/p VF arrest during TKR 7/14 2/2 NSTEMI => LHC with 3v CAD =>  s/p CABG 07/21/12 (LIMA-LAD, SVG-RI and OM1, SVG-distal RCA).   . Carotid stenosis     Pre-CABG Dopplers 07/19/12: RCA 40-59%.  . DVT (deep venous thrombosis) 07/2012    Current Outpatient Prescriptions  Medication Sig Dispense Refill  . aspirin 325 MG EC tablet Take 1 tablet (325 mg total) by mouth daily.  30 tablet  0  . calcium citrate-vitamin D (CITRACAL+D) 315-200 MG-UNIT per tablet Take 1 tablet by mouth daily.      . folic acid (FOLVITE) 1 MG tablet Take 1 tablet (1 mg total) by mouth daily.  30 tablet  1  . iron polysaccharides (NIFEREX) 150 MG capsule Take 1 capsule (150 mg total) by mouth daily.  30 capsule  1  . lisinopril (PRINIVIL,ZESTRIL) 2.5 MG tablet Take 1 tablet (2.5 mg total) by mouth daily.  30 tablet  11  . metoprolol tartrate (LOPRESSOR) 50 MG tablet Take 1 tablet (50 mg total) by mouth 2 (two) times daily.  60 tablet  11  . oxyCODONE (OXY IR/ROXICODONE) 5 MG immediate release tablet Take 1-2 tablets (5-10 mg total) by mouth every 3 (three) hours as needed.  60 tablet  0  .  traMADol (ULTRAM) 50 MG tablet Take 1 tablet (50 mg total) by mouth every 6 (six) hours as needed for pain.  40 tablet  0  . warfarin (COUMADIN) 5 MG tablet Take 1 tablet (5 mg total) by mouth daily at 6 PM.  30 tablet  1  . rosuvastatin (CRESTOR) 10 MG tablet Take 1 tablet (10 mg total) by mouth daily.  30 tablet  11   No current facility-administered medications for this visit.    Allergies:    Allergies  Allergen Reactions  . Statins Other (See Comments)    Joint pain  . Nickel Rash    Social History:  The patient  reports that she quit smoking about 12 years ago. She does not have any smokeless tobacco history on file. She reports that  drinks alcohol. She reports that she does not use illicit drugs.   ROS:  Please see the history  of present illness.      All other systems reviewed and negative.   PHYSICAL EXAM: VS:  BP 149/92  Pulse 92  Ht 5\' 2"  (1.575 m)  Wt 142 lb (64.411 kg)  BMI 25.97 kg/m2 Well nourished, well developed, in no acute distress HEENT: normal Neck: no JVD Cardiac:  normal S1, S2; RRR; no murmur Lungs:  clear to auscultation bilaterally, no wheezing, rhonchi or rales Abd: soft, nontender, no hepatomegaly Ext: no edema Skin: warm and dry Neuro:  CNs 2-12 intact, no focal abnormalities noted  EKG:  NSR, HR 92, normal axis, poor R wave progression, anteroseptal T-wave inversions     ASSESSMENT AND PLAN:  1. CAD:  She is doing well status post recent CABG.  She is due to start cardiac rehabilitation soon. I assume that her knee rehabilitation is completed.  Continue aspirin. She's had intolerance to statins in the past. She will try Crestor 10 mg daily. If she can tolerate this, check lipids and LFTs in 6-8 weeks. 2. Ischemic Cardiomyopathy: Continue beta blocker and ACE inhibitor. 3. Hypertension: Increase metoprolol to 50 mg twice a day. 4. Hyperlipidemia: Start Crestor 10 mg daily. Check lipids and LFTs in 6-8 weeks. 5. Right LE DVT: Continue Coumadin. She will likely need to continue this for 6 months. 6. DJD: Follow up with orthopedics as planned. 7. Disposition:  F/u with Dr. Rollene Rotunda in 2 mos.   Signed, Tereso Newcomer, PA-C  08/26/2012 5:55 PM

## 2012-08-26 NOTE — Patient Instructions (Addendum)
INCREASE METOPROLOL TO 50 MG TWICE DAILY' NEW RX WAS SENT IN FOR THE 50 MG TABLET  START CRESTOR 10 MG DAILY  FASTING LIPID AND LIVER PANEL 10/06/12; LAB OPENS AT 7:30 AM  YOU WILL NEED TO HAVE CAROTID DOPPLERS DONE IN 07/2013  PLEASE FOLLOW UP WITH DR. HOCHREIN 10/29/12 @ 2:15

## 2012-09-02 ENCOUNTER — Other Ambulatory Visit: Payer: Self-pay | Admitting: *Deleted

## 2012-09-02 DIAGNOSIS — E785 Hyperlipidemia, unspecified: Secondary | ICD-10-CM

## 2012-09-02 DIAGNOSIS — I10 Essential (primary) hypertension: Secondary | ICD-10-CM

## 2012-09-02 MED ORDER — ROSUVASTATIN CALCIUM 10 MG PO TABS: 10.0000 mg | ORAL_TABLET | Freq: Every day | ORAL | Status: DC

## 2012-09-02 MED ORDER — METOPROLOL TARTRATE 50 MG PO TABS: 50.0000 mg | ORAL_TABLET | Freq: Two times a day (BID) | ORAL | Status: DC

## 2012-09-02 MED ORDER — LISINOPRIL 2.5 MG PO TABS: 2.5000 mg | ORAL_TABLET | Freq: Every day | ORAL | Status: DC

## 2012-09-03 ENCOUNTER — Encounter (HOSPITAL_COMMUNITY)
Admission: RE | Admit: 2012-09-03 | Discharge: 2012-09-03 | Disposition: A | Discharge status: Home / Self Care | Payer: Medicare Other | Source: Ambulatory Visit | Attending: Cardiology | Admitting: Cardiology

## 2012-09-03 NOTE — Progress Notes (Signed)
Cardiac Rehab Medication Review by a Pharmacist  Does the patient  feel that his/her medications are working for him/her?  yes  Has the patient been experiencing any side effects to the medications prescribed?  Yes - possible joint pain with statin again  Does the patient measure his/her own blood pressure or blood glucose at home?  yes - blood pressure   Does the patient have any problems obtaining medications due to transportation or finances?   no  Understanding of regimen: good Understanding of indications: good Potential of compliance: excellent    Pharmacist comments: Patient demonstrates a good understanding of medication regimen and describes good compliance.    Susan Welch 09/03/2012 8:22 AM

## 2012-09-04 ENCOUNTER — Telehealth: Payer: Self-pay | Admitting: Cardiology

## 2012-09-04 NOTE — Telephone Encounter (Signed)
Rehab form has already been completed and faxed.  Pt is aware.  Left message for cardiac rehab the paperwork has been faxed and scanned into [employer]  It is under the media tab.  Requested they call back if they can't find it or if there is something else they need.

## 2012-09-04 NOTE — Telephone Encounter (Signed)
New problem    Pt is starting cardiac rehab next week but is waiting for Dr Antoine Poche approval for rehab. Please fax approval to [facility].

## 2012-09-08 ENCOUNTER — Ambulatory Visit (INDEPENDENT_AMBULATORY_CARE_PROVIDER_SITE_OTHER): Payer: Medicare Other | Admitting: *Deleted

## 2012-09-08 DIAGNOSIS — I824Y1 Acute embolism and thrombosis of unspecified deep veins of right proximal lower extremity: Secondary | ICD-10-CM

## 2012-09-08 DIAGNOSIS — I824Y9 Acute embolism and thrombosis of unspecified deep veins of unspecified proximal lower extremity: Secondary | ICD-10-CM

## 2012-09-08 DIAGNOSIS — Z7901 Long term (current) use of anticoagulants: Secondary | ICD-10-CM

## 2012-09-09 ENCOUNTER — Telehealth: Payer: Self-pay | Admitting: Nurse Practitioner

## 2012-09-09 ENCOUNTER — Encounter (HOSPITAL_COMMUNITY)
Admission: RE | Admit: 2012-09-09 | Discharge: 2012-09-09 | Disposition: A | Discharge status: Home / Self Care | Payer: Medicare Other | Source: Ambulatory Visit | Attending: Cardiology | Admitting: Cardiology

## 2012-09-09 DIAGNOSIS — Z5189 Encounter for other specified aftercare: Secondary | ICD-10-CM | POA: Insufficient documentation

## 2012-09-09 DIAGNOSIS — I469 Cardiac arrest, cause unspecified: Secondary | ICD-10-CM | POA: Insufficient documentation

## 2012-09-09 NOTE — Telephone Encounter (Signed)
Byrd Hesselbach, RN [facility] called with concern that patient's EKG today shows T wave inversion at Lead 2.  Patient will remain at cardiac rehab until Dr. Antoine Poche has reviewed EKG.  Maria faxed over the EKG which was taken to Dr. Antoine Poche for review.  Byrd Hesselbach reports that patient is asymptomatic.  I reviewed EKG with Dr. Antoine Poche who states he has no concerns.  Maria notified and verbalized gratitude.

## 2012-09-09 NOTE — Progress Notes (Signed)
Pt started cardiac rehab today.  Pt tolerated light exercise without difficulty. Telemetry rhythm Sinus with T wave inversion.  Vital signs stable. Faxed today's ECG tracings for Dr Antoine Poche reviewed ECG. No new orders received. Patient is okay to go home. Will continue to monitor the patient throughout  the program.

## 2012-09-10 ENCOUNTER — Inpatient Hospital Stay (HOSPITAL_COMMUNITY): Admission: RE | Admit: 2012-09-10 | Payer: Medicare Other | Source: Ambulatory Visit

## 2012-09-11 ENCOUNTER — Telehealth: Payer: Self-pay | Admitting: *Deleted

## 2012-09-11 ENCOUNTER — Encounter (HOSPITAL_COMMUNITY)
Admission: RE | Admit: 2012-09-11 | Discharge: 2012-09-11 | Disposition: A | Discharge status: Home / Self Care | Payer: Medicare Other | Source: Ambulatory Visit | Attending: Cardiology | Admitting: Cardiology

## 2012-09-11 DIAGNOSIS — E785 Hyperlipidemia, unspecified: Secondary | ICD-10-CM

## 2012-09-11 MED ORDER — PRAVASTATIN SODIUM 10 MG PO TABS: ORAL_TABLET | ORAL | Status: DC

## 2012-09-11 NOTE — Progress Notes (Signed)
PSYCHOSOCIAL ASSESSMENT  Pt psychosocial assessment reveals no barriers to rehab participation. Pt reports she has renewed outlook on life after her event for which she is extremely grateful for life.  Pt states for this reason she doesn't obsess on worries or concerns.  Pt states she has had significant loss in her life which she does not wish to discuss.  Pt states this has been extremely difficult for her but she is grieving appropriately now because "I have too."    Pt exhibits positive coping skills and has supportive family.  Offered emotional support and reassurance.  Will continue to monitor.

## 2012-09-11 NOTE — Telephone Encounter (Signed)
Message copied by Jeannine Kitten on Fri Sep 11, 2012  4:07 PM ------      Message from: Tarri Fuller      Created: Fri Sep 11, 2012 12:37 PM      Regarding: FW: cholesterol       pt cb about crestor. Recommendation from [place]. PA  to start pravastatin 10 mg on M, W, and Fri's only. Rx sent into [facility] on [address].            #### Pt also states to me that Dr. Eulah Pont said she no longer needs to take coumadin. I will advise [facility] to f/u on this.####      ----- Message -----         From: Beatrice Lecher, PA-C         Sent: 09/11/2012  12:08 PM           To: Tarri Fuller, CMA      Subject: cholesterol                                              See if she is willing to try Pravastatin 10 mg on MWF only (very low dose, absorbed differently, usually better tolerated, cheap drug).      Tereso Newcomer, PA-C        09/11/2012 12:09 PM             ----- Message -----         From: Jeannine Kitten, RN         Sent: 09/08/2012   4:02 PM           To: Beatrice Lecher, PA-C, Tarri Fuller, CMA            Pt in coumadin clinic today and she states she cannot tolerate Crestor that she is having joint pain right shoulder and left knee.States she has stopped taking Crestor couple days ago.      Thanks       Lelon Perla RN             ------

## 2012-09-11 NOTE — Telephone Encounter (Signed)
Message copied by Jeannine Kitten on Fri Sep 11, 2012  2:28 PM ------      Message from: Tarri Fuller      Created: Fri Sep 11, 2012 12:37 PM      Regarding: FW: cholesterol       pt cb about crestor. Recommendation from [place]. PA  to start pravastatin 10 mg on M, W, and Fri's only. Rx [facility] [address].            #### Pt also states to me that Dr. Eulah Pont said she no longer needs to take coumadin. I will advise [facility] to f/u on this.####      ----- Message -----         From: Beatrice Lecher, PA-C         Sent: 09/11/2012  12:08 PM           To: Tarri Fuller, CMA      Subject: cholesterol                                              See if she is willing to try Pravastatin 10 mg on MWF only (very low dose, absorbed differently, usually better tolerated, cheap drug).      Tereso Newcomer, PA-C        09/11/2012 12:09 PM             ----- Message -----         From: Jeannine Kitten, RN         Sent: 09/08/2012   4:02 PM           To: Beatrice Lecher, PA-C, Tarri Fuller, CMA            Pt in coumadin clinic today and she states she cannot tolerate Crestor that she is having joint pain right shoulder and left knee.States she has stopped taking Crestor couple days ago.      Thanks       Lelon Perla RN             ------

## 2012-09-11 NOTE — Telephone Encounter (Signed)
Spoke with pt and she states Dr Eulah Pont had instructed her she may come off coumadin . This nurse called Dr Eulah Pont office and they are to return call to clinic and also spoke with [facility] Dr Antoine Poche nurse who will forward message to Dr Antoine Poche regarding this

## 2012-09-11 NOTE — Telephone Encounter (Signed)
pt cb about crestor. Recommendation from [place]. PA  to start pravastatin 10 mg on M, W, and Fri's only. [facility] [address]. Pt also states to me that Dr. Eulah Pont said she no longer needs to take coumadin. I will advise [facility] to f/u on this,

## 2012-09-11 NOTE — Telephone Encounter (Signed)
Talked with Arline Asp at Dr Eulah Pont office and she states from office visit note of 09/09/2012 that she may completely come off coumadin if okay with Dr Tyrone Sage . Last office visit of Dr Tyrone Sage states Dr Antoine Poche is following her coumadin.

## 2012-09-11 NOTE — Telephone Encounter (Signed)
Pt states she was instructed by DR Eulah Pont to stop her coumadin and she has done so.This nurse called called Dr Helaine Chess office and spoke with the nurse and she states she will ask Dr Eulah Pont and will call back with information. Also spoke with Pam Dr Antoine Poche nurse and she states she will sen message to him for information and direction.

## 2012-09-11 NOTE — Telephone Encounter (Signed)
Message copied by Jeannine Kitten on Fri Sep 11, 2012  3:19 PM ------      Message from: Tarri Fuller      Created: Fri Sep 11, 2012 12:37 PM      Regarding: FW: cholesterol       pt cb about crestor. Recommendation from [place]. PA  to start pravastatin 10 mg on M, W, and Fri's only. [facility] [address].            #### Pt also states to me that Dr. Eulah Pont said she no longer needs to take coumadin. I will advise [facility] to f/u on this.####      ----- Message -----         From: Beatrice Lecher, PA-C         Sent: 09/11/2012  12:08 PM           To: Tarri Fuller, CMA      Subject: cholesterol                                              See if she is willing to try Pravastatin 10 mg on MWF only (very low dose, absorbed differently, usually better tolerated, cheap drug).      Tereso Newcomer, PA-C        09/11/2012 12:09 PM             ----- Message -----         From: Jeannine Kitten, RN         Sent: 09/08/2012   4:02 PM           To: Beatrice Lecher, PA-C, Tarri Fuller, CMA            Pt in coumadin clinic today and she states she cannot tolerate Crestor that she is having joint pain right shoulder and left knee.States she has stopped taking Crestor couple days ago.      Thanks       Lelon Perla RN             ------

## 2012-09-11 NOTE — Telephone Encounter (Signed)
Called pt and informed sent  message to Dr Antoine Poche to address discontinuation of coumadin per orders of  Dr Helaine Chess nurse pt was to discontinue once okayed by  Surgeon or cardiologist.  Since she is only 3 months post DVT advised pt until okayed to discontinue by her  Cardiologist  she should continue same dose of coumadin  for her protection. Pt states she is having small amt nose bleed and due to this instructed to continue same coumadin dose 5mg  daily and 2.5mg  on  Mondays and Fridays  and made her an appt to be seen in clinic on Monday. Pt states understanding

## 2012-09-11 NOTE — Telephone Encounter (Signed)
Per Clois Dupes in our Coumadin Clinic - per pt t/c - pt was told by Dr Eulah Pont that she is OK to stop coumadin now.  Jasmine December would like for Dr Antoine Poche to review to make sure it is OK.  Will forward to MD for review and orders.

## 2012-09-14 ENCOUNTER — Encounter (HOSPITAL_COMMUNITY)
Admission: RE | Admit: 2012-09-14 | Discharge: 2012-09-14 | Disposition: A | Discharge status: Home / Self Care | Payer: Medicare Other | Source: Ambulatory Visit | Attending: Cardiology | Admitting: Cardiology

## 2012-09-16 ENCOUNTER — Ambulatory Visit (INDEPENDENT_AMBULATORY_CARE_PROVIDER_SITE_OTHER): Payer: Medicare Other | Admitting: *Deleted

## 2012-09-16 ENCOUNTER — Encounter (HOSPITAL_COMMUNITY)
Admission: RE | Admit: 2012-09-16 | Discharge: 2012-09-16 | Disposition: A | Discharge status: Home / Self Care | Payer: Medicare Other | Source: Ambulatory Visit | Attending: Cardiology | Admitting: Cardiology

## 2012-09-16 DIAGNOSIS — Z7901 Long term (current) use of anticoagulants: Secondary | ICD-10-CM

## 2012-09-16 DIAGNOSIS — I824Y9 Acute embolism and thrombosis of unspecified deep veins of unspecified proximal lower extremity: Secondary | ICD-10-CM

## 2012-09-16 DIAGNOSIS — I824Y1 Acute embolism and thrombosis of unspecified deep veins of right proximal lower extremity: Secondary | ICD-10-CM

## 2012-09-16 LAB — POCT INR: INR: 2

## 2012-09-18 ENCOUNTER — Encounter (HOSPITAL_COMMUNITY)
Admission: RE | Admit: 2012-09-18 | Discharge: 2012-09-18 | Disposition: A | Discharge status: Home / Self Care | Payer: Medicare Other | Source: Ambulatory Visit | Attending: Cardiology | Admitting: Cardiology

## 2012-09-18 NOTE — Progress Notes (Signed)
Reviewed home exercise with pt today.  Pt plans to walk at home on days outside of CRP II, with an extra day of hand weights for exercise.  She will add in the bike when her knee pain allows.  Reviewed THR, pulse, RPE, sign and symptoms,and when to call 911 or MD.  Pt voiced understanding.  Alexia Freestone, MS, ACSM RCEP 09/18/2012 1330

## 2012-09-21 ENCOUNTER — Encounter (HOSPITAL_COMMUNITY): Payer: Medicare Other

## 2012-09-23 ENCOUNTER — Encounter (HOSPITAL_COMMUNITY)
Admission: RE | Admit: 2012-09-23 | Discharge: 2012-09-23 | Disposition: A | Discharge status: Home / Self Care | Payer: Medicare Other | Source: Ambulatory Visit | Attending: Cardiology | Admitting: Cardiology

## 2012-09-25 ENCOUNTER — Encounter (HOSPITAL_COMMUNITY)
Admission: RE | Admit: 2012-09-25 | Discharge: 2012-09-25 | Disposition: A | Discharge status: Home / Self Care | Payer: Medicare Other | Source: Ambulatory Visit | Attending: Cardiology | Admitting: Cardiology

## 2012-09-27 NOTE — Telephone Encounter (Signed)
The discharge summary from rehab was to continue warfarin for 2 months.  She has completed this.  However, the discharge instructions suggested that she should have follow up lower extremity venous Dopplers.

## 2012-09-28 ENCOUNTER — Encounter (HOSPITAL_COMMUNITY): Payer: Medicare Other

## 2012-09-29 ENCOUNTER — Telehealth: Payer: Self-pay | Admitting: Cardiology

## 2012-09-29 DIAGNOSIS — I824Y9 Acute embolism and thrombosis of unspecified deep veins of unspecified proximal lower extremity: Secondary | ICD-10-CM

## 2012-09-29 NOTE — Telephone Encounter (Signed)
Pt aware order placed for venous doppler - aware she will be called with an appointment

## 2012-09-29 NOTE — Telephone Encounter (Signed)
Left message for pt to call back to discuss scheduling a follow up venous doppler

## 2012-09-29 NOTE — Telephone Encounter (Signed)
Pt aware she will be called with an appointment for lower extremity doppler

## 2012-09-29 NOTE — Telephone Encounter (Signed)
Follow up:  Pt states she is returing Pam's call. Pt states she would like to have an PV doppler study scheduled. I informed her I'd pass the message along to the nurse as I don't see an order to schedule one. Please call pt back.

## 2012-09-30 ENCOUNTER — Encounter (HOSPITAL_COMMUNITY): Payer: Medicare Other

## 2012-09-30 ENCOUNTER — Telehealth (HOSPITAL_COMMUNITY): Payer: Self-pay | Admitting: Family Medicine

## 2012-10-02 ENCOUNTER — Encounter (HOSPITAL_COMMUNITY): Payer: Medicare Other

## 2012-10-05 ENCOUNTER — Encounter (HOSPITAL_COMMUNITY)
Admission: RE | Admit: 2012-10-05 | Discharge: 2012-10-05 | Disposition: A | Discharge status: Home / Self Care | Payer: Medicare Other | Source: Ambulatory Visit | Attending: Cardiology | Admitting: Cardiology

## 2012-10-06 ENCOUNTER — Other Ambulatory Visit (INDEPENDENT_AMBULATORY_CARE_PROVIDER_SITE_OTHER): Payer: Medicare Other

## 2012-10-06 ENCOUNTER — Ambulatory Visit (INDEPENDENT_AMBULATORY_CARE_PROVIDER_SITE_OTHER): Payer: Medicare Other

## 2012-10-06 DIAGNOSIS — Z7901 Long term (current) use of anticoagulants: Secondary | ICD-10-CM

## 2012-10-06 DIAGNOSIS — I824Y9 Acute embolism and thrombosis of unspecified deep veins of unspecified proximal lower extremity: Secondary | ICD-10-CM

## 2012-10-06 DIAGNOSIS — I824Y1 Acute embolism and thrombosis of unspecified deep veins of right proximal lower extremity: Secondary | ICD-10-CM

## 2012-10-06 DIAGNOSIS — E785 Hyperlipidemia, unspecified: Secondary | ICD-10-CM

## 2012-10-06 LAB — LIPID PANEL
Cholesterol: 230 mg/dL — ABNORMAL HIGH (ref 0–200)
HDL: 44.7 mg/dL (ref >39.00)
Total CHOL/HDL Ratio: 5
Triglycerides: 246 mg/dL — ABNORMAL HIGH (ref 0.0–149.0)
VLDL: 49.2 mg/dL — ABNORMAL HIGH (ref 0.0–40.0)

## 2012-10-06 LAB — HEPATIC FUNCTION PANEL: Albumin: 4.2 g/dL (ref 3.5–5.2)

## 2012-10-06 LAB — POCT INR: INR: 2.5

## 2012-10-07 ENCOUNTER — Encounter (HOSPITAL_COMMUNITY)
Admission: RE | Admit: 2012-10-07 | Discharge: 2012-10-07 | Disposition: A | Discharge status: Home / Self Care | Payer: Medicare Other | Source: Ambulatory Visit | Attending: Cardiology | Admitting: Cardiology

## 2012-10-07 ENCOUNTER — Telehealth: Payer: Self-pay | Admitting: *Deleted

## 2012-10-07 DIAGNOSIS — I469 Cardiac arrest, cause unspecified: Secondary | ICD-10-CM | POA: Insufficient documentation

## 2012-10-07 DIAGNOSIS — Z5189 Encounter for other specified aftercare: Secondary | ICD-10-CM | POA: Insufficient documentation

## 2012-10-07 NOTE — Telephone Encounter (Signed)
lmptcb for lab results; I will try again tomorrow 

## 2012-10-08 NOTE — Telephone Encounter (Signed)
pt notified about lab results and recommendation from PA to increase pravastatin to 20 mg m, w, f. Pt states she just has so much pain and myalgia's since switched from crestor to pravastatin. She said that a friend told her about zetia. She asked if she could have zetia. I explained that I will d/w PA and let her know what he said. Pt states he knee surgery was in July but the severe pain she is having is from the switch from crestor to pravastatin. Pt states to me she knows the pain is not coming from knee surgery because it has healed very well. I advised pt since she had a knee replacement that she may be healed on the outside but still has a lot of healing to do on the inside, she said I know that. I did again tell her I will d/w PA and let he know what he recommends.

## 2012-10-09 ENCOUNTER — Encounter (HOSPITAL_COMMUNITY)
Admission: RE | Admit: 2012-10-09 | Discharge: 2012-10-09 | Disposition: A | Discharge status: Home / Self Care | Payer: Medicare Other | Source: Ambulatory Visit | Attending: Cardiology | Admitting: Cardiology

## 2012-10-09 ENCOUNTER — Telehealth: Payer: Self-pay | Admitting: *Deleted

## 2012-10-09 DIAGNOSIS — E785 Hyperlipidemia, unspecified: Secondary | ICD-10-CM

## 2012-10-09 NOTE — Telephone Encounter (Signed)
I explained to pt that Tereso Newcomer, PA recommends that we refer her to the Lipid Clinic here in the office. I advised that scheduling w/call her with date and time of appt. Pt said thank you for my time and efforts on this matter for her. I told pt my pleasure to try and help her.

## 2012-10-12 ENCOUNTER — Encounter (HOSPITAL_COMMUNITY): Payer: Medicare Other

## 2012-10-14 ENCOUNTER — Encounter (HOSPITAL_COMMUNITY)
Admission: RE | Admit: 2012-10-14 | Discharge: 2012-10-14 | Disposition: A | Discharge status: Home / Self Care | Payer: Medicare Other | Source: Ambulatory Visit | Attending: Cardiology | Admitting: Cardiology

## 2012-10-15 ENCOUNTER — Ambulatory Visit (HOSPITAL_COMMUNITY): Payer: Medicare Other | Attending: Cardiovascular Disease

## 2012-10-15 DIAGNOSIS — I824Y9 Acute embolism and thrombosis of unspecified deep veins of unspecified proximal lower extremity: Secondary | ICD-10-CM

## 2012-10-15 DIAGNOSIS — Z86718 Personal history of other venous thrombosis and embolism: Secondary | ICD-10-CM | POA: Insufficient documentation

## 2012-10-15 DIAGNOSIS — I10 Essential (primary) hypertension: Secondary | ICD-10-CM | POA: Insufficient documentation

## 2012-10-15 DIAGNOSIS — I87009 Postthrombotic syndrome without complications of unspecified extremity: Secondary | ICD-10-CM

## 2012-10-16 ENCOUNTER — Ambulatory Visit: Payer: Medicare Other | Admitting: Pharmacist

## 2012-10-16 ENCOUNTER — Encounter (HOSPITAL_COMMUNITY)
Admission: RE | Admit: 2012-10-16 | Discharge: 2012-10-16 | Disposition: A | Discharge status: Home / Self Care | Payer: Medicare Other | Source: Ambulatory Visit | Attending: Cardiology | Admitting: Cardiology

## 2012-10-16 ENCOUNTER — Ambulatory Visit (INDEPENDENT_AMBULATORY_CARE_PROVIDER_SITE_OTHER): Payer: Medicare Other | Admitting: Pharmacist

## 2012-10-16 DIAGNOSIS — Z79899 Other long term (current) drug therapy: Secondary | ICD-10-CM

## 2012-10-16 DIAGNOSIS — IMO0001 Reserved for inherently not codable concepts without codable children: Secondary | ICD-10-CM

## 2012-10-16 DIAGNOSIS — E789 Disorder of lipoprotein metabolism, unspecified: Secondary | ICD-10-CM

## 2012-10-16 MED ORDER — VITAMIN D 50 MCG (2000 UT) PO TABS: 2000.0000 [IU] | ORAL_TABLET | Freq: Every day | ORAL | Status: AC

## 2012-10-16 MED ORDER — CO Q10 200 MG PO CAPS: 200.0000 mg | ORAL_CAPSULE | Freq: Every day | ORAL | Status: AC

## 2012-10-16 MED ORDER — ROSUVASTATIN CALCIUM 10 MG PO TABS: 10.0000 mg | ORAL_TABLET | ORAL | Status: DC

## 2012-10-16 NOTE — Assessment & Plan Note (Signed)
Given excellent diet and exercising regularly, patient will need lipid lowering medication in order to achieve an LDL reduction of 50%.  Baseline LDL ~ 150-160 mg/dL, so LDL goal of < 70 mg/dL appropriate.  Patient willing add Crestor 10 mg once weekly (can achieve up to 20% reduction), and then in 2 months if needed will add Zetia 10 mg daily (hopefully total of 40-50% if tolerating both).  Patient encouraged to continue current exercise regimen.  She will add Vitamin D 2,000 units daily, and also Co-Enzyme Q-10 200 mg daily in hopes of preventing muscle aches from Crestor 10 mg qweek.  Will assess Vitamin D 25-OH level in 8 weeks, as we often see inability to tolerate statins due to low Vitamin D levels.  Patient will come back to see me 1 week after bloodwork done, and if needed will consider adding Zetia at that time, or titrating Crestor to more frequently than once weekly.

## 2012-10-16 NOTE — Patient Instructions (Signed)
1.  Start Vitamin D 2,000 Units daily. 2.  Start Co-Enzyme Q-10 200 mg daily. 3.  Next week, start Crestor 10 mg once weekly (Friday).    Recheck cholesterol 12/16/12 See Riki Rusk 12/23/12.

## 2012-10-16 NOTE — Progress Notes (Signed)
Patient presents to lipid clinic due to history failing a few statins, and a recent CABG earlier this year.  Baseline LDL is ~ 160 mg/dL off meds.  She is fearful of using statins currently, but is curious about Zetia or Welchol use. Risk factors:  CABG, HTN, low HDL, age. LDL goal < 70 Non-HDL goal < 100. Current lipid lowering meds:  None.  She stopped pravastatin M/W/F 1 week ago due to joint and muscle pain. Intolerant:  Lipitor daily, Crestor 10 mg daily, Pravachol 10 mg MWF.   Diet:  Low fat diet, with exception she eats out 1 night per week.  No red meat.  Eats fish or chicken twice weekly, otherwise eats only fruits and vegetables typically.  No alcohol.  Rarely drinks soda or eats desserts. Exercise:  For past 3 months since CABG she rides her upright bike 3 days per week, and doing cardiac rehab 3 days per week. She hasn't tried other lipd lowering meds other than lipitor, crestor, pravachol, but is resistant to using daily statins moving forward.  09/2012  Labs:  LDL 145, TC 230, TG 246, HDL 45 (was on pravastatin 10 mg qMWF at time of these labs).  Stopped it right after lab work though.  Current Outpatient Prescriptions  Medication Sig Dispense Refill  . acetaminophen (TYLENOL) 500 MG tablet Take 1,000 mg by mouth daily as needed for pain.      Marland Kitchen aspirin 325 MG EC tablet Take 1 tablet (325 mg total) by mouth daily.  30 tablet  0  . calcium citrate-vitamin D (CITRACAL+D) 315-200 MG-UNIT per tablet Take 1 tablet by mouth daily.      . folic acid (FOLVITE) 1 MG tablet Take 1 tablet (1 mg total) by mouth daily.  30 tablet  1  . iron polysaccharides (NIFEREX) 150 MG capsule Take 1 capsule (150 mg total) by mouth daily.  30 capsule  1  . lisinopril (PRINIVIL,ZESTRIL) 2.5 MG tablet Take 1 tablet (2.5 mg total) by mouth daily.  30 tablet  3  . metoprolol (LOPRESSOR) 50 MG tablet Take 1 tablet (50 mg total) by mouth 2 (two) times daily.  60 tablet  3  . oxyCODONE (OXY IR/ROXICODONE) 5 MG  immediate release tablet Take 5 mg by mouth daily as needed for pain.      . pravastatin (PRAVACHOL) 10 MG tablet Take 1 tablet only on M, W, Fri's  30 tablet  11  . traMADol (ULTRAM) 50 MG tablet Take 1 tablet (50 mg total) by mouth every 6 (six) hours as needed for pain.  40 tablet  0  . warfarin (COUMADIN) 5 MG tablet Take 1 tablet (5 mg total) by mouth daily at 6 PM.  30 tablet  1   No current facility-administered medications for this visit.   Allergies  Allergen Reactions  . Statins Other (See Comments)    Joint pain  . Nickel Rash   Family History  Problem Relation Age of Onset  . Thyroid disease Mother     has thyroid removed

## 2012-10-16 NOTE — Assessment & Plan Note (Signed)
>>  ASSESSMENT AND PLAN FOR LIPID DISORDER WRITTEN ON 10/16/2012  4:16 PM BY SMART, JEREMY G, RPH  Given excellent diet and exercising regularly, patient will need lipid lowering medication in order to achieve an LDL reduction of 50%.  Baseline LDL ~ 150-160 mg/dL, so LDL goal of < 70 mg/dL appropriate.  Patient willing add Crestor 10 mg once weekly (can achieve up to 20% reduction), and then in 2 months if needed will add Zetia 10 mg daily (hopefully total of 40-50% if tolerating both).  Patient encouraged to continue current exercise regimen.  She will add Vitamin D 2,000 units daily, and also Co-Enzyme Q-10 200 mg daily in hopes of preventing muscle aches from Crestor 10 mg qweek.  Will assess Vitamin D 25-OH level in 8 weeks, as we often see inability to tolerate statins due to low Vitamin D levels.  Patient will come back to see me 1 week after bloodwork done, and if needed will consider adding Zetia at that time, or titrating Crestor to more frequently than once weekly.

## 2012-10-19 ENCOUNTER — Encounter (HOSPITAL_COMMUNITY): Payer: Medicare Other

## 2012-10-21 ENCOUNTER — Encounter (HOSPITAL_COMMUNITY)
Admission: RE | Admit: 2012-10-21 | Discharge: 2012-10-21 | Disposition: A | Discharge status: Home / Self Care | Payer: Medicare Other | Source: Ambulatory Visit | Attending: Cardiology | Admitting: Cardiology

## 2012-10-22 ENCOUNTER — Ambulatory Visit: Payer: Medicare Other | Admitting: Physician Assistant

## 2012-10-23 ENCOUNTER — Encounter (HOSPITAL_COMMUNITY)
Admission: RE | Admit: 2012-10-23 | Discharge: 2012-10-23 | Disposition: A | Discharge status: Home / Self Care | Payer: Medicare Other | Source: Ambulatory Visit | Attending: Cardiology | Admitting: Cardiology

## 2012-10-26 ENCOUNTER — Encounter (HOSPITAL_COMMUNITY)
Admission: RE | Admit: 2012-10-26 | Discharge: 2012-10-26 | Disposition: A | Discharge status: Home / Self Care | Payer: Medicare Other | Source: Ambulatory Visit | Attending: Cardiology | Admitting: Cardiology

## 2012-10-28 ENCOUNTER — Encounter (HOSPITAL_COMMUNITY)
Admission: RE | Admit: 2012-10-28 | Discharge: 2012-10-28 | Disposition: A | Discharge status: Home / Self Care | Payer: Medicare Other | Source: Ambulatory Visit | Attending: Cardiology | Admitting: Cardiology

## 2012-10-29 ENCOUNTER — Encounter: Payer: Self-pay | Admitting: Cardiology

## 2012-10-29 ENCOUNTER — Ambulatory Visit (INDEPENDENT_AMBULATORY_CARE_PROVIDER_SITE_OTHER): Payer: Medicare Other | Admitting: General Practice

## 2012-10-29 ENCOUNTER — Ambulatory Visit (INDEPENDENT_AMBULATORY_CARE_PROVIDER_SITE_OTHER): Payer: Medicare Other | Admitting: Cardiology

## 2012-10-29 VITALS — BP 140/74 | HR 78 | Ht 62.0 in | Wt 144.0 lb

## 2012-10-29 DIAGNOSIS — I824Y1 Acute embolism and thrombosis of unspecified deep veins of right proximal lower extremity: Secondary | ICD-10-CM

## 2012-10-29 DIAGNOSIS — I2510 Atherosclerotic heart disease of native coronary artery without angina pectoris: Secondary | ICD-10-CM

## 2012-10-29 DIAGNOSIS — I824Y9 Acute embolism and thrombosis of unspecified deep veins of unspecified proximal lower extremity: Secondary | ICD-10-CM

## 2012-10-29 DIAGNOSIS — I2589 Other forms of chronic ischemic heart disease: Secondary | ICD-10-CM

## 2012-10-29 DIAGNOSIS — Z7901 Long term (current) use of anticoagulants: Secondary | ICD-10-CM

## 2012-10-29 DIAGNOSIS — E785 Hyperlipidemia, unspecified: Secondary | ICD-10-CM

## 2012-10-29 DIAGNOSIS — Z0000 Encounter for general adult medical examination without abnormal findings: Secondary | ICD-10-CM

## 2012-10-29 LAB — POCT INR: INR: 2.5

## 2012-10-29 MED ORDER — ASPIRIN 81 MG PO TBEC: 81.0000 mg | DELAYED_RELEASE_TABLET | Freq: Every day | ORAL | Status: DC

## 2012-10-29 MED ORDER — WARFARIN SODIUM 5 MG PO TABS: ORAL_TABLET | ORAL | Status: DC

## 2012-10-29 NOTE — Patient Instructions (Addendum)
Please decrease ASA to 81 mg a day, Continue all other medications as listed.  Follow up with Dr Antoine Poche 01/2013

## 2012-10-29 NOTE — Progress Notes (Signed)
HPI [name] returns for followup after bypass. He was admitted for total knee replacement and had a V. Fib arrest and was found to have three-vessel disease as described. She has a mildly reduced ejection fraction. In rehabilitation she also had a DVT. She's actually recovering from all of this. She is naturally somewhat anxious. She never really had symptoms except maybe some fatigue for heartburn previously. In cardiac rehabilitation she is doing well. She denies any chest pressure, neck or arm discomfort. She's not noticing any palpitations, presyncope or syncope. She has had no weight gain or edema.   Allergies  Allergen Reactions  . Statins Other (See Comments)    Joint pain  . Nickel Rash    Current Outpatient Prescriptions  Medication Sig Dispense Refill  . acetaminophen (TYLENOL) 500 MG tablet Take 1,000 mg by mouth daily as needed for pain.      Marland Kitchen aspirin 325 MG EC tablet Take 1 tablet (325 mg total) by mouth daily.  30 tablet  0  . calcium citrate-vitamin D (CITRACAL+D) 315-200 MG-UNIT per tablet Take 1 tablet by mouth daily.      . Cholecalciferol (VITAMIN D) 2000 UNITS tablet Take 1 tablet (2,000 Units total) by mouth daily.  30 tablet  0  . Coenzyme Q10 (CO Q10) 200 MG CAPS Take 200 mg by mouth daily.  30 capsule  0  . folic acid (FOLVITE) 1 MG tablet Take 1 tablet (1 mg total) by mouth daily.  30 tablet  1  . iron polysaccharides (NIFEREX) 150 MG capsule Take 1 capsule (150 mg total) by mouth daily.  30 capsule  1  . lisinopril (PRINIVIL,ZESTRIL) 2.5 MG tablet Take 1 tablet (2.5 mg total) by mouth daily.  30 tablet  3  . metoprolol (LOPRESSOR) 50 MG tablet Take 1 tablet (50 mg total) by mouth 2 (two) times daily.  60 tablet  3  . oxyCODONE (OXY IR/ROXICODONE) 5 MG immediate release tablet Take 5 mg by mouth daily as needed for pain.      . rosuvastatin (CRESTOR) 10 MG tablet Take 1 tablet (10 mg total) by mouth once a week.  90 tablet  3  . traMADol (ULTRAM) 50 MG  tablet Take 1 tablet (50 mg total) by mouth every 6 (six) hours as needed for pain.  40 tablet  0  . warfarin (COUMADIN) 5 MG tablet Take 1 tablet (5 mg total) by mouth daily at 6 PM.  30 tablet  1   No current facility-administered medications for this visit.    Past Medical History  Diagnosis Date  . Arthritis     a. bilat knees s/p L TKA 07/15/2012  . Anemia   . Hypertension   . Anxiety     Hx: of situational anxiety  . Headache(784.0)     Hx: of migraines  . Hyperlipidemia   . Panic attacks   . Ventricular fibrillation     a. 07/15/2012 in setting of R TKA  . Ischemic cardiomyopathy     Echo 07/19/12: EF 40-45%, basal to mid anteroseptal AK, distal anteroseptal HK, mild MR.   . Coronary artery disease     a. s/p VF arrest during TKR 7/14 2/2 NSTEMI => LHC with 3v CAD =>  s/p CABG 07/21/12 (LIMA-LAD, SVG-RI and OM1, SVG-distal RCA).   . Carotid stenosis     Pre-CABG Dopplers 07/19/12: RCA 40-59%.  . DVT (deep venous thrombosis) 07/2012    Past Surgical History  Procedure Laterality Date  .  Abdominal hysterectomy    . Dilation and curettage of uterus    . Total knee arthroplasty Right 07/15/2012    Procedure: TOTAL KNEE ARTHROPLASTY;  Surgeon: Loreta Ave, MD;  Location: [facility] OR;  Service: Orthopedics;  Laterality: Right;  drapes pulled back to provide cpr, new drape applied, protocol followed  . Coronary artery bypass graft N/A 07/21/2012    Procedure: CORONARY ARTERY BYPASS GRAFTING (CABG);  Surgeon: Delight Ovens, MD;  Location: [facility] OR;  Service: Open Heart Surgery;  Laterality: N/A;  Times 4 using left internal mammary artery and endoscopically harvested left saphenous vein.  . Intraoperative transesophageal echocardiogram N/A 07/21/2012    Procedure: INTRAOPERATIVE TRANSESOPHAGEAL ECHOCARDIOGRAM;  Surgeon: Delight Ovens, MD;  Location: [facility] OR;  Service: Open Heart Surgery;  Laterality: N/A;    ROS:    As stated in the HPI and negative for all other systems.  PHYSICAL  EXAM BP 140/74  Pulse 78  Ht 5\' 2"  (1.575 m)  Wt 144 lb (65.318 kg)  BMI 26.33 kg/m2  SpO2 97% GENERAL:  Well appearing HEENT:  Pupils equal round and reactive, fundi not visualized, oral mucosa unremarkable NECK:  No jugular venous distention, waveform within normal limits, carotid upstroke brisk and symmetric, no bruits, no thyromegaly LYMPHATICS:  No cervical, inguinal adenopathy LUNGS:  Clear to auscultation bilaterally BACK:  No CVA tenderness CHEST:  Well healed sternotomy scar. HEART:  PMI not displaced or sustained,S1 and S2 within normal limits, no S3, no S4, no clicks, no rubs, no murmurs ABD:  Flat, positive bowel sounds normal in frequency in pitch, no bruits, no rebound, no guarding, no midline pulsatile mass, no hepatomegaly, no splenomegaly EXT:  2 plus pulses throughout, no edema, no cyanosis no clubbing SKIN:  No rashes no nodules NEURO:  Cranial nerves II through XII grossly intact, motor grossly intact throughout PSYCH:  Cognitively intact, oriented to person place and time   ASSESSMENT AND PLAN  CAD:  She is doing well and tolerating rehabilitation. I answered quite a few of her questions concerning her recent problems. She will continue with risk reduction. She can reduce to 81 mg aspirin.  DYSLIPIDEMIA:  She has dyslipidemia and is now being followed in our lipid clinic.  DVT:  I will continue the warfarin through January but we'll reduce the aspirin to 81 mg daily.  CARDIOMYOPATHY:  Her EF was mildly reduced.  For now I will continue the meds as listed and I will reassess with an echo in the future.

## 2012-10-30 ENCOUNTER — Encounter (HOSPITAL_COMMUNITY): Payer: Medicare Other

## 2012-10-30 ENCOUNTER — Other Ambulatory Visit: Payer: Self-pay | Admitting: Physician Assistant

## 2012-11-02 ENCOUNTER — Encounter (HOSPITAL_COMMUNITY)
Admission: RE | Admit: 2012-11-02 | Discharge: 2012-11-02 | Disposition: A | Discharge status: Home / Self Care | Payer: Medicare Other | Source: Ambulatory Visit | Attending: Cardiology | Admitting: Cardiology

## 2012-11-04 ENCOUNTER — Encounter (HOSPITAL_COMMUNITY)
Admission: RE | Admit: 2012-11-04 | Discharge: 2012-11-04 | Disposition: A | Discharge status: Home / Self Care | Payer: Medicare Other | Source: Ambulatory Visit | Attending: Cardiology | Admitting: Cardiology

## 2012-11-04 NOTE — Progress Notes (Signed)
[name] 67 y.o. female Nutrition Note Spoke with pt.  Nutrition Plan and Nutrition Survey goals reviewed with pt. Pt is following Step 2 of the Therapeutic Lifestyle Changes diet. Per pt, she wants to lose wt. Pt states her pre-op wt was 160 lb. Pt wt on 07/24/12 166.2 lb. Pt's current wt is down 23.6 lb from wt 3.5 months ago.  Pt has been trying to lose wt by "changing my diet." Wt loss tips reviewed. Pt is pre-diabetic according to 07/20/12 A1c. Pre-diabetes discussed. Pt encouraged to discuss pre-diabetes further with her PCP.Pt expressed understanding of the information reviewed. Pt aware of nutrition education classes offered and wants to attend nutrition classes.  Nutrition Diagnosis   Food-and nutrition-related knowledge deficit related to lack of exposure to information as related to diagnosis of: ? CVD ? Pre-DM (A1c 5.8) ?   Overweight related to excessive energy intake as evidenced by a BMI of 26.1  Nutrition RX/ Re-Estimated Daily Nutrition Needs for: wt loss  1200-1350 Kcal, 30-35 gm fat, 10-12 gm sat fat, 1.6-1.8 gm trans-fat, <1500 mg sodium   Nutrition Intervention   Pt's individual nutrition plan including cholesterol goals reviewed with pt.   Benefits of adopting Therapeutic Lifestyle Changes discussed when Medficts reviewed.   Pt to attend the Portion Distortion class   Pt given handouts for: ? Nutrition I class ? Nutrition II class   Continue client-centered nutrition education by RD, as part of interdisciplinary care.  Goal(s)   Pt to identify food quantities necessary to achieve: ? wt loss to a goal wt of 118-136 lb (53.9-62.1 kg) at graduation from cardiac rehab.   Monitor and Evaluate progress toward nutrition goal with team. Nutrition Risk:  Low   Mickle Plumb, M.Ed, RD, LDN, CDE 11/04/2012 2:45 PM

## 2012-11-06 ENCOUNTER — Encounter (HOSPITAL_COMMUNITY): Payer: Medicare Other

## 2012-11-09 ENCOUNTER — Encounter (HOSPITAL_COMMUNITY)
Admission: RE | Admit: 2012-11-09 | Discharge: 2012-11-09 | Disposition: A | Discharge status: Home / Self Care | Payer: Medicare Other | Source: Ambulatory Visit | Attending: Cardiology | Admitting: Cardiology

## 2012-11-09 DIAGNOSIS — Z5189 Encounter for other specified aftercare: Secondary | ICD-10-CM | POA: Insufficient documentation

## 2012-11-09 DIAGNOSIS — I469 Cardiac arrest, cause unspecified: Secondary | ICD-10-CM | POA: Insufficient documentation

## 2012-11-11 ENCOUNTER — Encounter (HOSPITAL_COMMUNITY)
Admission: RE | Admit: 2012-11-11 | Discharge: 2012-11-11 | Disposition: A | Discharge status: Home / Self Care | Payer: Medicare Other | Source: Ambulatory Visit | Attending: Cardiology | Admitting: Cardiology

## 2012-11-13 ENCOUNTER — Encounter (HOSPITAL_COMMUNITY): Payer: Medicare Other

## 2012-11-16 ENCOUNTER — Encounter (HOSPITAL_COMMUNITY)
Admission: RE | Admit: 2012-11-16 | Discharge: 2012-11-16 | Disposition: A | Discharge status: Home / Self Care | Payer: Medicare Other | Source: Ambulatory Visit | Attending: Cardiology | Admitting: Cardiology

## 2012-11-18 ENCOUNTER — Encounter (HOSPITAL_COMMUNITY)
Admission: RE | Admit: 2012-11-18 | Discharge: 2012-11-18 | Disposition: A | Discharge status: Home / Self Care | Payer: Medicare Other | Source: Ambulatory Visit | Attending: Cardiology | Admitting: Cardiology

## 2012-11-20 ENCOUNTER — Encounter (HOSPITAL_COMMUNITY): Payer: Medicare Other

## 2012-11-23 ENCOUNTER — Encounter (HOSPITAL_COMMUNITY): Payer: Medicare Other

## 2012-11-25 ENCOUNTER — Ambulatory Visit (INDEPENDENT_AMBULATORY_CARE_PROVIDER_SITE_OTHER): Payer: Medicare Other | Admitting: *Deleted

## 2012-11-25 ENCOUNTER — Encounter (HOSPITAL_COMMUNITY)
Admission: RE | Admit: 2012-11-25 | Discharge: 2012-11-25 | Disposition: A | Discharge status: Home / Self Care | Payer: Medicare Other | Source: Ambulatory Visit | Attending: Cardiology | Admitting: Cardiology

## 2012-11-25 ENCOUNTER — Telehealth: Payer: Self-pay | Admitting: Pharmacist

## 2012-11-25 DIAGNOSIS — I824Y9 Acute embolism and thrombosis of unspecified deep veins of unspecified proximal lower extremity: Secondary | ICD-10-CM

## 2012-11-25 DIAGNOSIS — Z7901 Long term (current) use of anticoagulants: Secondary | ICD-10-CM

## 2012-11-25 DIAGNOSIS — I824Y1 Acute embolism and thrombosis of unspecified deep veins of right proximal lower extremity: Secondary | ICD-10-CM

## 2012-11-25 LAB — POCT INR: INR: 2.4

## 2012-11-25 MED ORDER — EZETIMIBE 10 MG PO TABS: 10.0000 mg | ORAL_TABLET | Freq: Every day | ORAL | Status: DC

## 2012-11-25 NOTE — Telephone Encounter (Signed)
Patient called to let me know that after being on Crestor 10 mg qweek for past 6 weeks both of her knees are hurting enough to where it is difficult to get around without a cane.  There was no problem for the first month of Crestor, but over past 10 days the pain has set in.  She has failed other multiple statins, and really wants to avoid them if at all possible.    Patient had a recent CABG earlier this year. Baseline LDL is ~ 160 mg/dL off meds. She is interested in [place] and [place], but wants to stay away from statins given her history..  Risk factors: CABG, HTN, low HDL, age.  LDL goal < 70  Non-HDL goal < 100.   Intolerant: Lipitor daily, Crestor 10 mg daily, Pravachol 10 mg MWF, and now Crestor 10 mg qweek. Diet: Low fat diet, with exception she eats out 1 night per week. No red meat. Eats fish or chicken twice weekly, otherwise eats only fruits and vegetables typically. No alcohol. Rarely drinks soda or eats desserts.  Exercise: For past 4 months since CABG she rides her upright bike 3 days per week, and doing cardiac rehab 3 days per week.   She has still been able to do this even while on Crestor qweek and knee pain.  Plan: 1. Patient instructed to stay off Crestor, and in 2 weeks start Zetia 10 mg qday.  Samples x 4 weeks and rx sent [facility].  If muscle aches occur on Zetia she will reduce to 1/2 tablet daily as the 5 mg dose showed a 15% reduction still in when studied, but even less side effects. 2.  Recheck lipid / liver / vitamin D in 8 weeks (02/08/13).  If vitamin D is low, this could be contributing to her medication intolerances. 3.  See me 02/12/13 in clinic to review results.  Welchol could be considered at that time if needed.  Will discuss 02/12/13.  To Dr. Antoine Poche as FYI only. Marland Kitchen

## 2012-11-27 ENCOUNTER — Encounter (HOSPITAL_COMMUNITY)
Admission: RE | Admit: 2012-11-27 | Discharge: 2012-11-27 | Disposition: A | Discharge status: Home / Self Care | Payer: Medicare Other | Source: Ambulatory Visit | Attending: Cardiology | Admitting: Cardiology

## 2012-11-30 ENCOUNTER — Encounter (HOSPITAL_COMMUNITY)
Admission: RE | Admit: 2012-11-30 | Discharge: 2012-11-30 | Disposition: A | Discharge status: Home / Self Care | Payer: Medicare Other | Source: Ambulatory Visit | Attending: Cardiology | Admitting: Cardiology

## 2012-12-02 ENCOUNTER — Encounter (HOSPITAL_COMMUNITY): Payer: Medicare Other

## 2012-12-04 ENCOUNTER — Encounter (HOSPITAL_COMMUNITY): Payer: Medicare Other

## 2012-12-07 ENCOUNTER — Encounter (HOSPITAL_COMMUNITY)
Admission: RE | Admit: 2012-12-07 | Discharge: 2012-12-07 | Disposition: A | Discharge status: Home / Self Care | Payer: Medicare Other | Source: Ambulatory Visit | Attending: Cardiology | Admitting: Cardiology

## 2012-12-07 DIAGNOSIS — Z5189 Encounter for other specified aftercare: Secondary | ICD-10-CM | POA: Insufficient documentation

## 2012-12-07 DIAGNOSIS — I469 Cardiac arrest, cause unspecified: Secondary | ICD-10-CM | POA: Insufficient documentation

## 2012-12-09 ENCOUNTER — Encounter (HOSPITAL_COMMUNITY)
Admission: RE | Admit: 2012-12-09 | Discharge: 2012-12-09 | Disposition: A | Discharge status: Home / Self Care | Payer: Medicare Other | Source: Ambulatory Visit | Attending: Cardiology | Admitting: Cardiology

## 2012-12-11 ENCOUNTER — Encounter (HOSPITAL_COMMUNITY): Payer: Medicare Other

## 2012-12-14 ENCOUNTER — Encounter (HOSPITAL_COMMUNITY)
Admission: RE | Admit: 2012-12-14 | Discharge: 2012-12-14 | Disposition: A | Discharge status: Home / Self Care | Payer: Medicare Other | Source: Ambulatory Visit | Attending: Cardiology | Admitting: Cardiology

## 2012-12-16 ENCOUNTER — Other Ambulatory Visit: Payer: Medicare Other

## 2012-12-16 ENCOUNTER — Encounter (HOSPITAL_COMMUNITY)
Admission: RE | Admit: 2012-12-16 | Discharge: 2012-12-16 | Disposition: A | Discharge status: Home / Self Care | Payer: Medicare Other | Source: Ambulatory Visit | Attending: Cardiology | Admitting: Cardiology

## 2012-12-18 ENCOUNTER — Encounter (HOSPITAL_COMMUNITY)
Admission: RE | Admit: 2012-12-18 | Discharge: 2012-12-18 | Disposition: A | Discharge status: Home / Self Care | Payer: Medicare Other | Source: Ambulatory Visit | Attending: Cardiology | Admitting: Cardiology

## 2012-12-21 ENCOUNTER — Encounter (HOSPITAL_COMMUNITY): Payer: Medicare Other

## 2012-12-23 ENCOUNTER — Encounter (HOSPITAL_COMMUNITY)
Admission: RE | Admit: 2012-12-23 | Discharge: 2012-12-23 | Disposition: A | Discharge status: Home / Self Care | Payer: Medicare Other | Source: Ambulatory Visit | Attending: Cardiology | Admitting: Cardiology

## 2012-12-23 ENCOUNTER — Ambulatory Visit: Payer: Medicare Other | Admitting: Pharmacist

## 2012-12-25 ENCOUNTER — Encounter (HOSPITAL_COMMUNITY)
Admission: RE | Admit: 2012-12-25 | Discharge: 2012-12-25 | Disposition: A | Discharge status: Home / Self Care | Payer: Medicare Other | Source: Ambulatory Visit | Attending: Cardiology | Admitting: Cardiology

## 2012-12-28 ENCOUNTER — Encounter (HOSPITAL_COMMUNITY)
Admission: RE | Admit: 2012-12-28 | Discharge: 2012-12-28 | Disposition: A | Discharge status: Home / Self Care | Payer: Medicare Other | Source: Ambulatory Visit | Attending: Cardiology | Admitting: Cardiology

## 2012-12-30 ENCOUNTER — Encounter (HOSPITAL_COMMUNITY): Payer: Medicare Other

## 2013-01-03 ENCOUNTER — Other Ambulatory Visit: Payer: Self-pay | Admitting: Physician Assistant

## 2013-01-04 ENCOUNTER — Encounter (HOSPITAL_COMMUNITY): Payer: Medicare Other

## 2013-01-06 ENCOUNTER — Encounter (HOSPITAL_COMMUNITY): Payer: Medicare Other

## 2013-01-08 ENCOUNTER — Encounter (HOSPITAL_COMMUNITY): Payer: Medicare Other

## 2013-01-11 ENCOUNTER — Encounter (HOSPITAL_COMMUNITY): Payer: Self-pay | Attending: Cardiology

## 2013-01-13 ENCOUNTER — Encounter (HOSPITAL_COMMUNITY): Payer: Self-pay

## 2013-01-15 ENCOUNTER — Other Ambulatory Visit: Payer: Self-pay | Admitting: Physician Assistant

## 2013-01-15 ENCOUNTER — Encounter (HOSPITAL_COMMUNITY): Payer: Self-pay

## 2013-01-18 ENCOUNTER — Encounter (HOSPITAL_COMMUNITY): Payer: Self-pay

## 2013-01-20 ENCOUNTER — Encounter (HOSPITAL_COMMUNITY): Payer: Self-pay

## 2013-01-22 ENCOUNTER — Encounter (HOSPITAL_COMMUNITY): Payer: Self-pay

## 2013-01-25 ENCOUNTER — Encounter (HOSPITAL_COMMUNITY): Payer: Self-pay

## 2013-01-25 ENCOUNTER — Other Ambulatory Visit: Payer: Self-pay | Admitting: Physician Assistant

## 2013-01-27 ENCOUNTER — Encounter (HOSPITAL_COMMUNITY): Payer: Self-pay

## 2013-01-29 ENCOUNTER — Ambulatory Visit (INDEPENDENT_AMBULATORY_CARE_PROVIDER_SITE_OTHER): Payer: Medicare Other | Admitting: Cardiology

## 2013-01-29 ENCOUNTER — Encounter (HOSPITAL_COMMUNITY): Payer: Self-pay

## 2013-01-29 ENCOUNTER — Encounter: Payer: Self-pay | Admitting: Cardiology

## 2013-01-29 VITALS — BP 134/80 | HR 70 | Ht 62.0 in | Wt 147.4 lb

## 2013-01-29 DIAGNOSIS — I255 Ischemic cardiomyopathy: Secondary | ICD-10-CM

## 2013-01-29 DIAGNOSIS — I10 Essential (primary) hypertension: Secondary | ICD-10-CM

## 2013-01-29 DIAGNOSIS — Z951 Presence of aortocoronary bypass graft: Secondary | ICD-10-CM

## 2013-01-29 DIAGNOSIS — I2589 Other forms of chronic ischemic heart disease: Secondary | ICD-10-CM

## 2013-01-29 MED ORDER — LISINOPRIL 5 MG PO TABS
5.0000 mg | ORAL_TABLET | Freq: Every day | ORAL | Status: DC
Start: 2013-01-29 — End: 2013-05-06

## 2013-01-29 NOTE — Progress Notes (Signed)
HPI Susan Welch returns for followup after bypass. She was admitted for total knee replacement and had a V. Fib arrest and was found to have three-vessel disease as described. She has a mildly reduced ejection fraction. In rehabilitation she also had a DVT.   Since I Iast saw her she has done well.  The patient denies any new symptoms such as chest discomfort, neck or arm discomfort. There has been no new shortness of breath, PND or orthopnea. There have been no reported palpitations, presyncope or syncope.  She has completed physical therapy.   Allergies  Allergen Reactions  . Statins Other (See Comments)    Joint pain  . Nickel Rash    Current Outpatient Prescriptions  Medication Sig Dispense Refill  . acetaminophen (TYLENOL) 500 MG tablet Take 1,000 mg by mouth daily as needed for pain.      Marland Kitchen aspirin 81 MG EC tablet Take 1 tablet (81 mg total) by mouth daily.    0  . calcium citrate-vitamin D (CITRACAL+D) 315-200 MG-UNIT per tablet Take 1 tablet by mouth daily.      . Cholecalciferol (VITAMIN D) 2000 UNITS tablet Take 1 tablet (2,000 Units total) by mouth daily.  30 tablet  0  . Coenzyme Q10 (CO Q10) 200 MG CAPS Take 200 mg by mouth daily.  30 capsule  0  . ezetimibe (ZETIA) 10 MG tablet Take 1 tablet (10 mg total) by mouth daily.  30 tablet  5  . folic acid (FOLVITE) 1 MG tablet Take 1 tablet (1 mg total) by mouth daily.  30 tablet  1  . lisinopril (PRINIVIL,ZESTRIL) 2.5 MG tablet TAKE 1 TABLET BY MOUTH EVERY DAY  30 tablet  0  . metoprolol (LOPRESSOR) 50 MG tablet TAKE 1 TABLET BY MOUTH TWICE A DAY  60 tablet  0  . warfarin (COUMADIN) 5 MG tablet Take as directed by anticoagulation clinic  30 tablet  3   No current facility-administered medications for this visit.    Past Medical History  Diagnosis Date  . Arthritis     a. bilat knees s/p L TKA 07/15/2012  . Anemia   . Hypertension   . Anxiety     Hx: of situational anxiety  . Headache(784.0)     Hx: of migraines  .  Hyperlipidemia   . Panic attacks   . Ventricular fibrillation     a. 07/15/2012 in setting of R TKA  . Ischemic cardiomyopathy     Echo 07/19/12: EF 40-45%, basal to mid anteroseptal AK, distal anteroseptal HK, mild MR.   . Coronary artery disease     a. s/p VF arrest during TKR 7/14 2/2 NSTEMI => LHC with 3v CAD =>  s/p CABG 07/21/12 (LIMA-LAD, SVG-RI and OM1, SVG-distal RCA).   . Carotid stenosis     Pre-CABG Dopplers 07/19/12: RCA 40-59%.  . DVT (deep venous thrombosis) 07/2012    Past Surgical History  Procedure Laterality Date  . Abdominal hysterectomy    . Dilation and curettage of uterus    . Total knee arthroplasty Right 07/15/2012    Procedure: TOTAL KNEE ARTHROPLASTY;  Surgeon: [name], MD;  Location: [place];  Service: Orthopedics;  Laterality: Right;  drapes pulled back to provide cpr, new drape applied, protocol followed  . Coronary artery bypass graft N/A 07/21/2012    Procedure: CORONARY ARTERY BYPASS GRAFTING (CABG);  Surgeon: Grace Isaac, MD;  Location: [place];  Service: Open Heart Surgery;  Laterality: N/A;  Times  4 using left internal mammary artery and endoscopically harvested left saphenous vein.  . Intraoperative transesophageal echocardiogram N/A 07/21/2012    Procedure: INTRAOPERATIVE TRANSESOPHAGEAL ECHOCARDIOGRAM;  Surgeon: Grace Isaac, MD;  Location: [place];  Service: Open Heart Surgery;  Laterality: N/A;    ROS:    As stated in the HPI and negative for all other systems.  PHYSICAL EXAM BP 134/80  Pulse 70  Ht 5\' 2"  (1.575 m)  Wt 147 lb 6.4 oz (66.86 kg)  BMI 26.95 kg/m2 GENERAL:  Well appearing NECK:  No jugular venous distention, waveform within normal limits, carotid upstroke brisk and symmetric, no bruits, no thyromegaly LYMPHATICS:  No cervical, inguinal adenopathy LUNGS:  Clear to auscultation bilaterally BACK:  No CVA tenderness CHEST:  Well healed sternotomy scar. HEART:  PMI not displaced or sustained,S1 and S2 within normal limits, no  S3, no S4, no clicks, no rubs, no murmurs ABD:  Flat, positive bowel sounds normal in frequency in pitch, no bruits, no rebound, no guarding, no midline pulsatile mass, no hepatomegaly, no splenomegaly EXT:  2 plus pulses throughout, no edema, no cyanosis no clubbing   EKG:  Sinus rhythm, rate 70, axis within normal limits, poor anterior R wave progression, premature atrial contractions, old inferior infarct, nonspecific inferior and lateral T-wave inversions. 01/29/2013  ASSESSMENT AND PLAN  CAD:  The patient has no new sypmtoms.  No further cardiovascular testing is indicated.  We will continue with aggressive risk reduction and meds as listed.  DYSLIPIDEMIA:  She has dyslipidemia and is now being followed in our lipid clinic.  DVT:  She can stop the warfarin at the end of this month.  CARDIOMYOPATHY:  Her EF was mildly reduced.  I will continue to titrate meds with an increase in the lisinopril to 5 mg daily.   CAROTID STENOSIS.  This was mild and we will plan a follow up Doppler later this year.

## 2013-01-29 NOTE — Patient Instructions (Signed)
Please increase your Lisinopril to 5 mg a day. You may stop your Coumadin at the end of the month. Continue all other medications as listed  Follow up in 2 months with Dr Percival Spanish.

## 2013-02-01 ENCOUNTER — Encounter (HOSPITAL_COMMUNITY): Payer: Self-pay

## 2013-02-03 ENCOUNTER — Encounter (HOSPITAL_COMMUNITY): Payer: Self-pay

## 2013-02-05 ENCOUNTER — Encounter (HOSPITAL_COMMUNITY): Payer: Self-pay

## 2013-02-08 ENCOUNTER — Other Ambulatory Visit (INDEPENDENT_AMBULATORY_CARE_PROVIDER_SITE_OTHER): Payer: Medicare Other

## 2013-02-08 ENCOUNTER — Encounter (HOSPITAL_COMMUNITY): Payer: Self-pay | Attending: Cardiology

## 2013-02-08 DIAGNOSIS — Z79899 Other long term (current) drug therapy: Secondary | ICD-10-CM

## 2013-02-08 DIAGNOSIS — E789 Disorder of lipoprotein metabolism, unspecified: Secondary | ICD-10-CM

## 2013-02-08 DIAGNOSIS — IMO0001 Reserved for inherently not codable concepts without codable children: Secondary | ICD-10-CM

## 2013-02-08 LAB — HEPATIC FUNCTION PANEL
ALT: 24 U/L (ref 0–35)
AST: 22 U/L (ref 0–37)
Albumin: 4.2 g/dL (ref 3.5–5.2)
Alkaline Phosphatase: 57 U/L (ref 39–117)
BILIRUBIN DIRECT: 0 mg/dL (ref 0.0–0.3)
Total Bilirubin: 0.7 mg/dL (ref 0.3–1.2)
Total Protein: 7.6 g/dL (ref 6.0–8.3)

## 2013-02-08 LAB — LIPID PANEL
Cholesterol: 249 mg/dL — ABNORMAL HIGH (ref 0–200)
HDL: 56.1 mg/dL (ref >39.00)
TRIGLYCERIDES: 152 mg/dL — AB (ref 0.0–149.0)
Total CHOL/HDL Ratio: 4
VLDL: 30.4 mg/dL (ref 0.0–40.0)

## 2013-02-08 LAB — LDL CHOLESTEROL, DIRECT: Direct LDL: 174.9 mg/dL

## 2013-02-09 LAB — VITAMIN D 25 HYDROXY (VIT D DEFICIENCY, FRACTURES): Vit D, 25-Hydroxy: 68 ng/mL (ref 30–89)

## 2013-02-10 ENCOUNTER — Encounter (HOSPITAL_COMMUNITY): Payer: Self-pay

## 2013-02-12 ENCOUNTER — Encounter (HOSPITAL_COMMUNITY): Payer: Self-pay

## 2013-02-12 ENCOUNTER — Ambulatory Visit (INDEPENDENT_AMBULATORY_CARE_PROVIDER_SITE_OTHER): Payer: Medicare Other | Admitting: Pharmacist

## 2013-02-12 ENCOUNTER — Ambulatory Visit: Payer: Self-pay | Admitting: Pharmacist

## 2013-02-12 VITALS — Wt 147.0 lb

## 2013-02-12 DIAGNOSIS — Z79899 Other long term (current) drug therapy: Secondary | ICD-10-CM

## 2013-02-12 DIAGNOSIS — Z7901 Long term (current) use of anticoagulants: Secondary | ICD-10-CM

## 2013-02-12 DIAGNOSIS — I824Y9 Acute embolism and thrombosis of unspecified deep veins of unspecified proximal lower extremity: Secondary | ICD-10-CM

## 2013-02-12 DIAGNOSIS — E789 Disorder of lipoprotein metabolism, unspecified: Secondary | ICD-10-CM

## 2013-02-12 MED ORDER — COLESEVELAM HCL 3.75 G PO PACK: PACK | ORAL | Status: DC

## 2013-02-12 NOTE — Patient Instructions (Signed)
Plan: 1.  Start Welchol 3.75 g powder for suspension.  Mix in 4-8 ounces of water/juice/tea and drink once daily. 2.  Continue Zetia 10 mg once daily. 3.  Continue low fat diet.  Start eating more green leafy vegetables now that off warfarin.  Add flax seed to morning cereal or yogurt. 4.  I will call you if we get access to PCSK-9 inhibitor study in statin intolerant patients (SPIRE 1 / 2 with Pf[employer]). 5.  Recheck cholesterol in 3 months (05/18/13), and see Ysidro Evert 3 days later 05/21/13 - 10:30 am.

## 2013-02-12 NOTE — Assessment & Plan Note (Signed)
>>  ASSESSMENT AND PLAN FOR LIPID DISORDER WRITTEN ON 02/12/2013 12:29 PM BY SMART, JEREMY G, RPH  Patient tolerating Zetia 10 mg qd well, but didn't get as large of an LDL reduction as hoped.  Patient may be a hypo-absorber.  Will have patient add Welchol to the Zetia given she can't tolerate statins and these agents compliment each others lipid lowering effects.  Patient and I discussed PCSK-9 study for statin intolerant patients, and she would like to be contacted if we get access to study.  She seems to meet the criteria for previous PCSK-9 studies we have done.  She has no h/o cancer.   Patient will start eating more vegetables now that off coumadin, and will cut back on fruit which she hopes will help her lose weight.  Also hopefully more roughage will help prevent constipation which often accompanies Welchol use.  She wants Welchol powder instead of pills. Plan: Plan: 1.  Start Welchol 3.75 g powder for suspension.  Mix in 4-8 ounces of water/juice/tea and drink once daily. 2.  Continue Zetia 10 mg once daily. 3.  Continue low fat diet.  Start eating more green leafy vegetables now that off warfarin.  Add flax seed to morning cereal or yogurt. 4.  I will call you if we get access to PCSK-9 inhibitor study in statin intolerant patients (SPIRE 1 / 2 with Pf[employer]). 5.  Recheck cholesterol in 3 months (05/18/13), and see Riki Rusk 3 days later 05/21/13 - 10:30 am.

## 2013-02-12 NOTE — Progress Notes (Signed)
Patient returns to lipid clinic today for follow up.  She has a long history of failing statins, and a had a CABG last year.  Baseline LDL is ~ 180 mg/dL off meds.  She is fearful of using statins, and has failed multiple daily statins, qod pravastatin, and once weekly Crestor.  She is not willing to use statins again.  She started Zetia 10 mg six weeks ago, and has tolerated this well.  Her TG have improved on Zetia, and non-HDL improved slightly as well.  Her DLL was 145 mg/dL in 09/2012 while on pravastatin 10 mg q M/W/F, and is up to 175 mg/dL today on Zetia 10 mg qd.  She could possibly be a hypoabsorber.  Patient interested in using Zetia and Welchol.  We also discussed PCSK-9 study for statin intolerant patients.  We are hoping to gain access to [facility] 1 and 2 through [place], and she wants to be called to enroll if we get selected for a study site.  Her vitamin D level is normal, so not likely the source of muscle weakness from statin.  Risk factors:  CABG, HTN, low HDL, age LDL goal < 70; Non-HDL goal < 100. Current lipid lowering meds:  Zetia 10 mg qd Intolerant:  Lipitor daily, Crestor 10 mg daily, Pravachol 10 mg MWF, Crestor 10 mg qweek (failed after 5 weeks)  Diet:  Low fat diet, with exception she eats out 1 night per week.  No red meat.  Eats fish or chicken twice weekly, otherwise eats only fruits and vegetables typically.  No alcohol.  Rarely drinks soda or eats desserts.  Breakfast eats cereal every morning, and lunch is typically a sandwich or leftovers. Her lunch is bigger than her dinner.  She just finished warfarin last month, and thus plans on eating a lot more green leafy vegetables.  She will cut down on the fruit she was eating given all the sugar in it. Exercise:  since CABG she rides her upright bike 3 days per week, and doing cardiac rehab 3 days per week.  She is resistant to using statins moving forward, but is interested in [place], and then study drug if she still  isn't at goal on this.   Labs: 02/2013:  LDL 175, TG 152, TC 249, HDL 56, LFTs normal, Vitamin D 68 (normal) - on Zetia 10 mg qd. 09/2012:  LDL 145, TC 230, TG 246, HDL 45 (was on pravastatin 10 mg qMWF at time of these labs).  Stopped it right after lab work though.  Current Outpatient Prescriptions  Medication Sig Dispense Refill  . acetaminophen (TYLENOL) 500 MG tablet Take 1,000 mg by mouth daily as needed for pain.      Marland Kitchen aspirin 81 MG EC tablet Take 1 tablet (81 mg total) by mouth daily.    0  . calcium citrate-vitamin D (CITRACAL+D) 315-200 MG-UNIT per tablet Take 1 tablet by mouth daily.      . Cholecalciferol (VITAMIN D) 2000 UNITS tablet Take 1 tablet (2,000 Units total) by mouth daily.  30 tablet  0  . Coenzyme Q10 (CO Q10) 200 MG CAPS Take 200 mg by mouth daily.  30 capsule  0  . ezetimibe (ZETIA) 10 MG tablet Take 1 tablet (10 mg total) by mouth daily.  30 tablet  5  . folic acid (FOLVITE) 1 MG tablet Take 1 tablet (1 mg total) by mouth daily.  30 tablet  1  . lisinopril (PRINIVIL,ZESTRIL) 5 MG tablet Take  1 tablet (5 mg total) by mouth daily.  30 tablet  11  . metoprolol (LOPRESSOR) 50 MG tablet TAKE 1 TABLET BY MOUTH TWICE A DAY  60 tablet  0   No current facility-administered medications for this visit.   Allergies  Allergen Reactions  . Statins Other (See Comments)    Joint pain  . Nickel Rash   Family History  Problem Relation Age of Onset  . Thyroid disease Mother     has thyroid removed

## 2013-02-12 NOTE — Assessment & Plan Note (Addendum)
Patient tolerating Zetia 10 mg qd well, but didn't get as large of an LDL reduction as hoped.  Patient may be a hypo-absorber.  Will have patient add Welchol to the Zetia given she can't tolerate statins and these agents compliment each others lipid lowering effects.  Patient and I discussed PCSK-9 study for statin intolerant patients, and she would like to be contacted if we get access to study.  She seems to meet the criteria for previous PCSK-9 studies we have done.  She has no h/o cancer.   Patient will start eating more vegetables now that off coumadin, and will cut back on fruit which she hopes will help her lose weight.  Also hopefully more roughage will help prevent constipation which often accompanies Welchol use.  She wants Welchol powder instead of pills. Plan: Plan: 1.  Start Welchol 3.75 g powder for suspension.  Mix in 4-8 ounces of water/juice/tea and drink once daily. 2.  Continue Zetia 10 mg once daily. 3.  Continue low fat diet.  Start eating more green leafy vegetables now that off warfarin.  Add flax seed to morning cereal or yogurt. 4.  I will call you if we get access to PCSK-9 inhibitor study in statin intolerant patients (SPIRE 1 / 2 with Pf[employer]). 5.  Recheck cholesterol in 3 months (05/18/13), and see Ysidro Evert 3 days later 05/21/13 - 10:30 am.

## 2013-02-15 ENCOUNTER — Other Ambulatory Visit: Payer: Self-pay | Admitting: Cardiology

## 2013-02-15 ENCOUNTER — Encounter (HOSPITAL_COMMUNITY): Payer: Self-pay

## 2013-02-17 ENCOUNTER — Encounter (HOSPITAL_COMMUNITY): Payer: Self-pay

## 2013-02-19 ENCOUNTER — Encounter (HOSPITAL_COMMUNITY): Payer: Self-pay

## 2013-02-22 ENCOUNTER — Encounter (HOSPITAL_COMMUNITY): Payer: Self-pay

## 2013-02-22 ENCOUNTER — Encounter: Payer: Self-pay | Admitting: Cardiology

## 2013-02-24 ENCOUNTER — Encounter (HOSPITAL_COMMUNITY): Payer: Self-pay

## 2013-02-26 ENCOUNTER — Encounter (HOSPITAL_COMMUNITY): Payer: Self-pay

## 2013-03-01 ENCOUNTER — Encounter (HOSPITAL_COMMUNITY): Payer: Self-pay

## 2013-03-03 ENCOUNTER — Encounter (HOSPITAL_COMMUNITY): Payer: Self-pay

## 2013-03-05 ENCOUNTER — Encounter (HOSPITAL_COMMUNITY): Payer: Self-pay

## 2013-03-08 ENCOUNTER — Encounter (HOSPITAL_COMMUNITY): Payer: Self-pay

## 2013-03-10 ENCOUNTER — Encounter (HOSPITAL_COMMUNITY): Payer: Self-pay

## 2013-03-12 ENCOUNTER — Encounter (HOSPITAL_COMMUNITY): Payer: Self-pay

## 2013-03-15 ENCOUNTER — Encounter (HOSPITAL_COMMUNITY): Payer: Self-pay

## 2013-03-16 ENCOUNTER — Encounter: Payer: Self-pay | Admitting: *Deleted

## 2013-03-17 ENCOUNTER — Encounter (HOSPITAL_COMMUNITY): Payer: Self-pay

## 2013-03-19 ENCOUNTER — Encounter (HOSPITAL_COMMUNITY): Payer: Self-pay

## 2013-03-22 ENCOUNTER — Encounter (HOSPITAL_COMMUNITY): Payer: Self-pay

## 2013-03-24 ENCOUNTER — Encounter (HOSPITAL_COMMUNITY): Payer: Self-pay

## 2013-03-25 ENCOUNTER — Ambulatory Visit: Payer: Medicare Other | Admitting: Cardiology

## 2013-03-26 ENCOUNTER — Encounter (HOSPITAL_COMMUNITY): Payer: Self-pay

## 2013-03-29 ENCOUNTER — Encounter (HOSPITAL_COMMUNITY): Payer: Self-pay

## 2013-03-31 ENCOUNTER — Encounter (HOSPITAL_COMMUNITY): Payer: Self-pay

## 2013-04-02 ENCOUNTER — Ambulatory Visit: Payer: Medicare Other | Admitting: Cardiology

## 2013-04-02 ENCOUNTER — Encounter (HOSPITAL_COMMUNITY): Payer: Self-pay

## 2013-04-05 ENCOUNTER — Encounter (HOSPITAL_COMMUNITY): Payer: Self-pay

## 2013-04-07 ENCOUNTER — Encounter (HOSPITAL_COMMUNITY): Payer: Self-pay

## 2013-04-09 ENCOUNTER — Encounter (HOSPITAL_COMMUNITY): Payer: Self-pay

## 2013-04-12 ENCOUNTER — Encounter (HOSPITAL_COMMUNITY): Payer: Self-pay

## 2013-04-14 ENCOUNTER — Encounter (HOSPITAL_COMMUNITY): Payer: Self-pay

## 2013-04-16 ENCOUNTER — Encounter (HOSPITAL_COMMUNITY): Payer: Self-pay

## 2013-04-19 ENCOUNTER — Encounter (HOSPITAL_COMMUNITY): Payer: Self-pay

## 2013-04-20 ENCOUNTER — Other Ambulatory Visit: Payer: Self-pay

## 2013-04-20 MED ORDER — METOPROLOL TARTRATE 50 MG PO TABS: ORAL_TABLET | ORAL | Status: DC

## 2013-04-21 ENCOUNTER — Encounter (HOSPITAL_COMMUNITY): Payer: Self-pay

## 2013-04-23 ENCOUNTER — Encounter (HOSPITAL_COMMUNITY): Payer: Self-pay

## 2013-04-26 ENCOUNTER — Encounter (HOSPITAL_COMMUNITY): Payer: Self-pay

## 2013-04-28 ENCOUNTER — Encounter (HOSPITAL_COMMUNITY): Payer: Self-pay

## 2013-04-30 ENCOUNTER — Encounter (HOSPITAL_COMMUNITY): Payer: Self-pay

## 2013-05-03 ENCOUNTER — Encounter (HOSPITAL_COMMUNITY): Payer: Self-pay

## 2013-05-05 ENCOUNTER — Encounter (HOSPITAL_COMMUNITY): Payer: Self-pay

## 2013-05-06 ENCOUNTER — Encounter: Payer: Self-pay | Admitting: Cardiology

## 2013-05-06 ENCOUNTER — Ambulatory Visit (INDEPENDENT_AMBULATORY_CARE_PROVIDER_SITE_OTHER): Payer: Medicare Other | Admitting: Cardiology

## 2013-05-06 VITALS — BP 163/87 | HR 59 | Ht 62.0 in | Wt 150.0 lb

## 2013-05-06 DIAGNOSIS — I2589 Other forms of chronic ischemic heart disease: Secondary | ICD-10-CM

## 2013-05-06 DIAGNOSIS — Z951 Presence of aortocoronary bypass graft: Secondary | ICD-10-CM

## 2013-05-06 DIAGNOSIS — R0989 Other specified symptoms and signs involving the circulatory and respiratory systems: Secondary | ICD-10-CM

## 2013-05-06 DIAGNOSIS — Z79899 Other long term (current) drug therapy: Secondary | ICD-10-CM

## 2013-05-06 DIAGNOSIS — I255 Ischemic cardiomyopathy: Secondary | ICD-10-CM

## 2013-05-06 MED ORDER — LISINOPRIL 5 MG PO TABS: 5.0000 mg | ORAL_TABLET | Freq: Two times a day (BID) | ORAL | Status: DC

## 2013-05-06 NOTE — Patient Instructions (Signed)
Please increase your Lisinopril to 5 mg twice a day. Continue all other medications as listed.  Please have blood work in approximately 10 days.  Your physician has requested that you have a carotid duplex. This test is an ultrasound of the carotid arteries in your neck. It looks at blood flow through these arteries that supply the brain with blood. Allow one hour for this exam. There are no restrictions or special instructions.  Follow up with Dr Percival Spanish in July 2015.

## 2013-05-06 NOTE — Progress Notes (Signed)
HPI Susan Welch returns for followup after bypass. She was admitted for total knee replacement and had a V. Fib arrest and was found to have three-vessel disease as described. She has a mildly reduced ejection fraction. In rehabilitation she also had a DVT.   Since I Iast saw her she has done well.  She has still had problems with range of motion of her knee but this is improving and she's walking a half mile most days now.The patient denies any new symptoms such as chest discomfort, neck or arm discomfort. There has been no new shortness of breath, PND or orthopnea. There have been no reported palpitations, presyncope or syncope.  At the last visit I did increase lisinopril for treatment of her EF of 45%. She tolerated this without problems.   Allergies  Allergen Reactions  . Statins Other (See Comments)    Joint pain  . Nickel Rash    Current Outpatient Prescriptions  Medication Sig Dispense Refill  . acetaminophen (TYLENOL) 500 MG tablet Take 1,000 mg by mouth daily as needed for pain.      Marland Kitchen aspirin 81 MG EC tablet Take 1 tablet (81 mg total) by mouth daily.    0  . calcium citrate-vitamin D (CITRACAL+D) 315-200 MG-UNIT per tablet Take 1 tablet by mouth daily.      . Cholecalciferol (VITAMIN D) 2000 UNITS tablet Take 1 tablet (2,000 Units total) by mouth daily.  30 tablet  0  . Coenzyme Q10 (CO Q10) 200 MG CAPS Take 200 mg by mouth daily.  30 capsule  0  . Colesevelam HCl (WELCHOL) 3.75 G PACK Mix 1 packet with 4-8 ounces of water or juice, and drink once daily  30 each  11  . ezetimibe (ZETIA) 10 MG tablet Take 1 tablet (10 mg total) by mouth daily.  30 tablet  5  . folic acid (FOLVITE) 1 MG tablet Take 1 tablet (1 mg total) by mouth daily.  30 tablet  1  . lisinopril (PRINIVIL,ZESTRIL) 5 MG tablet Take 1 tablet (5 mg total) by mouth daily.  30 tablet  11  . metoprolol (LOPRESSOR) 50 MG tablet TAKE 1 TABLET BY MOUTH TWICE DAILY  60 tablet  1   No current facility-administered  medications for this visit.    Past Medical History  Diagnosis Date  . Arthritis     a. bilat knees s/p L TKA 07/15/2012  . Anemia   . Hypertension   . Anxiety     Hx: of situational anxiety  . Headache(784.0)     Hx: of migraines  . Hyperlipidemia   . Panic attacks   . Ventricular fibrillation     a. 07/15/2012 in setting of R TKA  . Ischemic cardiomyopathy     Echo 07/19/12: EF 40-45%, basal to mid anteroseptal AK, distal anteroseptal HK, mild MR.   . Coronary artery disease     a. s/p VF arrest during TKR 7/14 2/2 NSTEMI => LHC with 3v CAD =>  s/p CABG 07/21/12 (LIMA-LAD, SVG-RI and OM1, SVG-distal RCA).   . Carotid stenosis     Pre-CABG Dopplers 07/19/12: Right 40-59%.  . DVT (deep venous thrombosis) 07/2012    Past Surgical History  Procedure Laterality Date  . Abdominal hysterectomy    . Dilation and curettage of uterus    . Total knee arthroplasty Right 07/15/2012    Procedure: TOTAL KNEE ARTHROPLASTY;  Surgeon: [name], MD;  Location: [place];  Service: Orthopedics;  Laterality:  Right;  drapes pulled back to provide cpr, new drape applied, protocol followed  . Coronary artery bypass graft N/A 07/21/2012    Procedure: CORONARY ARTERY BYPASS GRAFTING (CABG);  Surgeon: Grace Isaac, MD;  Location: [place];  Service: Open Heart Surgery;  Laterality: N/A;  Times 4 using left internal mammary artery and endoscopically harvested left saphenous vein.  . Intraoperative transesophageal echocardiogram N/A 07/21/2012    Procedure: INTRAOPERATIVE TRANSESOPHAGEAL ECHOCARDIOGRAM;  Surgeon: Grace Isaac, MD;  Location: [place];  Service: Open Heart Surgery;  Laterality: N/A;    ROS:    As stated in the HPI and negative for all other systems.  PHYSICAL EXAM BP 163/87  Pulse 59  Ht 5\' 2"  (1.575 m)  Wt 150 lb (68.04 kg)  BMI 27.43 kg/m2 GENERAL:  Well appearing NECK:  No jugular venous distention, waveform within normal limits, carotid upstroke brisk and symmetric, no bruits, no  thyromegaly LYMPHATICS:  No cervical, inguinal adenopathy LUNGS:  Clear to auscultation bilaterally BACK:  No CVA tenderness CHEST:  Well healed sternotomy scar. HEART:  PMI not displaced or sustained,S1 and S2 within normal limits, no S3, no S4, no clicks, no rubs, no murmurs ABD:  Flat, positive bowel sounds normal in frequency in pitch, no bruits, no rebound, no guarding, no midline pulsatile mass, no hepatomegaly, no splenomegaly EXT:  2 plus pulses throughout, trace edema, no cyanosis no clubbing   EKG:  Sinus rhythm, rate 58, axis within normal limits, poor anterior R wave progression, premature atrial contractions, old inferior infarct, nonspecific inferior and lateral T-wave inversions.  No change from prevoius. 05/06/2013  ASSESSMENT AND PLAN  CAD:  The patient has no new sypmtoms.  No further cardiovascular testing is indicated.  We will continue with aggressive risk reduction and meds as listed.  DYSLIPIDEMIA:  She has dyslipidemia and is now being followed in our lipid clinic. Her triglycerides were improved.    DVT:  She stopped her warfarin at the last visit.    CARDIOMYOPATHY:  Her EF was mildly reduced.  I will continue to titrate meds with an increase in the lisinopril to 5 bid.   CAROTID STENOSIS.  This was mild and we will plan a follow up Doppler in July.  I reviewed this with her.

## 2013-05-07 ENCOUNTER — Encounter (HOSPITAL_COMMUNITY): Payer: Self-pay

## 2013-05-10 ENCOUNTER — Encounter (HOSPITAL_COMMUNITY): Payer: Self-pay

## 2013-05-12 ENCOUNTER — Encounter (HOSPITAL_COMMUNITY): Payer: Self-pay

## 2013-05-14 ENCOUNTER — Encounter (HOSPITAL_COMMUNITY): Payer: Self-pay

## 2013-05-17 ENCOUNTER — Encounter (HOSPITAL_COMMUNITY): Payer: Self-pay

## 2013-05-18 ENCOUNTER — Other Ambulatory Visit (INDEPENDENT_AMBULATORY_CARE_PROVIDER_SITE_OTHER): Payer: Medicare Other

## 2013-05-18 DIAGNOSIS — E789 Disorder of lipoprotein metabolism, unspecified: Secondary | ICD-10-CM

## 2013-05-18 DIAGNOSIS — Z79899 Other long term (current) drug therapy: Secondary | ICD-10-CM

## 2013-05-18 DIAGNOSIS — I2589 Other forms of chronic ischemic heart disease: Secondary | ICD-10-CM

## 2013-05-18 DIAGNOSIS — I255 Ischemic cardiomyopathy: Secondary | ICD-10-CM

## 2013-05-18 LAB — BASIC METABOLIC PANEL
BUN: 19 mg/dL (ref 6–23)
CHLORIDE: 106 meq/L (ref 96–112)
CO2: 26 mEq/L (ref 19–32)
Calcium: 9.6 mg/dL (ref 8.4–10.5)
Creatinine, Ser: 0.8 mg/dL (ref 0.4–1.2)
GFR: 79.34 mL/min (ref >60.00)
Glucose, Bld: 78 mg/dL (ref 70–99)
Potassium: 4.1 mEq/L (ref 3.5–5.1)
Sodium: 139 mEq/L (ref 135–145)

## 2013-05-18 LAB — HEPATIC FUNCTION PANEL
ALT: 33 U/L (ref 0–35)
AST: 24 U/L (ref 0–37)
Albumin: 4.1 g/dL (ref 3.5–5.2)
Alkaline Phosphatase: 58 U/L (ref 39–117)
BILIRUBIN DIRECT: 0 mg/dL (ref 0.0–0.3)
Total Bilirubin: 0.6 mg/dL (ref 0.2–1.2)
Total Protein: 7.1 g/dL (ref 6.0–8.3)

## 2013-05-18 LAB — LIPID PANEL
CHOLESTEROL: 215 mg/dL — AB (ref 0–200)
HDL: 58.9 mg/dL (ref >39.00)
LDL Cholesterol: 119 mg/dL — ABNORMAL HIGH (ref 0–99)
Total CHOL/HDL Ratio: 4
Triglycerides: 184 mg/dL — ABNORMAL HIGH (ref 0.0–149.0)
VLDL: 36.8 mg/dL (ref 0.0–40.0)

## 2013-05-19 ENCOUNTER — Encounter (HOSPITAL_COMMUNITY): Payer: Self-pay

## 2013-05-20 ENCOUNTER — Ambulatory Visit: Payer: Medicare Other | Admitting: Pharmacist

## 2013-05-21 ENCOUNTER — Ambulatory Visit (INDEPENDENT_AMBULATORY_CARE_PROVIDER_SITE_OTHER): Payer: Medicare Other | Admitting: Pharmacist

## 2013-05-21 ENCOUNTER — Encounter (HOSPITAL_COMMUNITY): Payer: Self-pay

## 2013-05-21 VITALS — Wt 150.0 lb

## 2013-05-21 DIAGNOSIS — E789 Disorder of lipoprotein metabolism, unspecified: Secondary | ICD-10-CM

## 2013-05-21 NOTE — Assessment & Plan Note (Signed)
>>  ASSESSMENT AND PLAN FOR LIPID DISORDER WRITTEN ON 05/21/2013 11:13 AM BY SMART, JEREMY G, RPH  Patient is thrilled to see her cholesterol improvements and is wanting to do more to further lower LDL if possible.  She is excited about the opportunity to use one of the PCSK-9 inhibitors.  Given Medicare Part D, she would like to enroll into study if possible for cost reasons.  She has met definition of statin intolerance (failed pravastatin 10 mg qd, Crestor 10 mg qweek, and lipitor daily), had CABG in recent past, has elevated LDL, and doesn't have any history of cancer.  She understands study is placebo controlled and uses a SQ medication every 2 weeks.  She would like to enroll. I have passed her information to [employer] to screen patient.  She will call me if any problems or questions come up.

## 2013-05-21 NOTE — Assessment & Plan Note (Signed)
Patient is thrilled to see her cholesterol improvements and is wanting to do more to further lower LDL if possible.  She is excited about the opportunity to use one of the PCSK-9 inhibitors.  Given Medicare Part D, she would like to enroll into study if possible for cost reasons.  She has met definition of statin intolerance (failed pravastatin 10 mg qd, Crestor 10 mg qweek, and lipitor daily), had CABG in recent past, has elevated LDL, and doesn't have any history of cancer.  She understands study is placebo controlled and uses a SQ medication every 2 weeks.  She would like to enroll. I have passed her information to [employer] to screen patient.  She will call me if any problems or questions come up.

## 2013-05-21 NOTE — Progress Notes (Signed)
Patient returns to lipid clinic today for follow up.  She has a long history of failing statins, and a had a CABG last year.  Baseline LDL is ~ 180 mg/dL off meds.  She is fearful of using statins, and has failed multiple daily statins, qod pravastatin, and once weekly Crestor.  She is not willing to use statins again.  She added Welchol to her Zetia at last visit, and LDL is much improved today (LDL dropped from 175 mg/dL to 119 mg/dL on this combination).  We also discussed PCSK-9 study for statin intolerant patients last visit, and SPIRE-II is now enrolling statin intolerant patients, so we discussed this at today's visit. She is tolerating Zetia and Welchol well, with exception of horrible taste of Welchol powder, but she has found a drink to use that makes it palatable.  She is not interested in Welchol tablets.    Her vitamin D level is normal, so not likely the source of muscle weakness from statin.  Risk factors:  CABG, HTN, low HDL, age LDL goal < 70; Non-HDL goal < 100. Current lipid lowering meds:  Zetia 10 mg qd, Welchol powder 3.75 g once daily Intolerant:  Lipitor daily, Crestor 10 mg daily, Pravachol 10 mg MWF, Crestor 10 mg qweek (failed after 5 weeks)  Diet:  Low fat diet, with exception she eats out 1 night per week.  No red meat.  Eats fish or chicken twice weekly, otherwise eats only fruits and vegetables typically.  No alcohol.  Rarely drinks soda or eats desserts.  Breakfast eats cereal every morning, and lunch is typically a sandwich or leftovers. Her lunch is bigger than her dinner.  She is not eating a lot more vegetables and salads since she is no longer on warfarin. Exercise:  since CABG she rides her upright bike 3 days per week.  Labs: 05/2013:  LDL 119, TC 215, TG 184, HDL 59, LFTs normal (Zetia 10 mg qd, Welchol 3.75 g qd) 02/2013:  LDL 175, TG 152, TC 249, HDL 56, LFTs normal, Vitamin D 68 (normal) - on Zetia 10 mg qd. 09/2012:  LDL 145, TC 230, TG 246, HDL 45 (was on  pravastatin 10 mg qMWF at time of these labs).  Stopped it right after lab work though.  Current Outpatient Prescriptions  Medication Sig Dispense Refill  . acetaminophen (TYLENOL) 500 MG tablet Take 1,000 mg by mouth daily as needed for pain.      Marland Kitchen aspirin 81 MG EC tablet Take 1 tablet (81 mg total) by mouth daily.    0  . calcium citrate-vitamin D (CITRACAL+D) 315-200 MG-UNIT per tablet Take 1 tablet by mouth daily.      . Cholecalciferol (VITAMIN D) 2000 UNITS tablet Take 1 tablet (2,000 Units total) by mouth daily.  30 tablet  0  . Coenzyme Q10 (CO Q10) 200 MG CAPS Take 200 mg by mouth daily.  30 capsule  0  . Colesevelam HCl (WELCHOL) 3.75 G PACK Mix 1 packet with 4-8 ounces of water or juice, and drink once daily  30 each  11  . ezetimibe (ZETIA) 10 MG tablet Take 1 tablet (10 mg total) by mouth daily.  30 tablet  5  . folic acid (FOLVITE) 1 MG tablet Take 1 tablet (1 mg total) by mouth daily.  30 tablet  1  . lisinopril (PRINIVIL,ZESTRIL) 5 MG tablet Take 1 tablet (5 mg total) by mouth 2 (two) times daily.  60 tablet  11  . metoprolol (  LOPRESSOR) 50 MG tablet TAKE 1 TABLET BY MOUTH TWICE DAILY  60 tablet  1   No current facility-administered medications for this visit.   Allergies  Allergen Reactions  . Statins Other (See Comments)    Joint pain with Lipitor daily, Pravastatin 10 mg qd and Pravastatin 10 mg M/W/F, Crestor 10 mg once weekly also caused joint aches  . Nickel Rash   Family History  Problem Relation Age of Onset  . Thyroid disease Mother     has thyroid removed

## 2013-05-24 ENCOUNTER — Encounter (HOSPITAL_COMMUNITY): Payer: Self-pay

## 2013-05-26 ENCOUNTER — Encounter (HOSPITAL_COMMUNITY): Payer: Self-pay

## 2013-05-28 ENCOUNTER — Encounter (HOSPITAL_COMMUNITY): Payer: Self-pay

## 2013-06-02 ENCOUNTER — Encounter (HOSPITAL_COMMUNITY): Payer: Self-pay

## 2013-06-04 ENCOUNTER — Encounter (HOSPITAL_COMMUNITY): Payer: Self-pay

## 2013-06-16 ENCOUNTER — Other Ambulatory Visit: Payer: Self-pay | Admitting: *Deleted

## 2013-06-16 MED ORDER — EZETIMIBE 10 MG PO TABS: 10.0000 mg | ORAL_TABLET | Freq: Every day | ORAL | Status: DC

## 2013-06-16 MED ORDER — METOPROLOL TARTRATE 50 MG PO TABS: ORAL_TABLET | ORAL | Status: DC

## 2013-08-02 ENCOUNTER — Ambulatory Visit (INDEPENDENT_AMBULATORY_CARE_PROVIDER_SITE_OTHER): Payer: Medicare Other | Admitting: Cardiology

## 2013-08-02 ENCOUNTER — Ambulatory Visit (HOSPITAL_COMMUNITY)
Admission: RE | Admit: 2013-08-02 | Discharge: 2013-08-02 | Disposition: A | Discharge status: Home / Self Care | Payer: Medicare Other | Source: Ambulatory Visit | Attending: Cardiology | Admitting: Cardiology

## 2013-08-02 ENCOUNTER — Encounter: Payer: Self-pay | Admitting: Cardiology

## 2013-08-02 VITALS — BP 130/80 | HR 60 | Ht 62.0 in | Wt 154.0 lb

## 2013-08-02 DIAGNOSIS — R0989 Other specified symptoms and signs involving the circulatory and respiratory systems: Secondary | ICD-10-CM | POA: Insufficient documentation

## 2013-08-02 DIAGNOSIS — I2589 Other forms of chronic ischemic heart disease: Secondary | ICD-10-CM

## 2013-08-02 DIAGNOSIS — I6529 Occlusion and stenosis of unspecified carotid artery: Secondary | ICD-10-CM

## 2013-08-02 DIAGNOSIS — I255 Ischemic cardiomyopathy: Secondary | ICD-10-CM

## 2013-08-02 DIAGNOSIS — I10 Essential (primary) hypertension: Secondary | ICD-10-CM

## 2013-08-02 NOTE — Progress Notes (Signed)
HPI Susan Welch returns for followup after bypass. She was admitted for total knee replacement and had a V. Fib arrest and was found to have three-vessel disease as described. She has a mildly reduced ejection fraction. In rehabilitation she also had a DVT.   Since I Iast saw her she has done well.  She has still had problems with range of motion of her knee but this is improving and she's walking a half mile most days now.The patient denies any new symptoms such as chest discomfort, neck or arm discomfort. There has been no new shortness of breath, PND or orthopnea. There have been no reported palpitations, presyncope or syncope.  At the last visit I did increase lisinopril for treatment of her EF of 45%. She tolerated this without problems.   Allergies  Allergen Reactions  . Statins Other (See Comments)    Joint pain with Lipitor daily, Pravastatin 10 mg qd and Pravastatin 10 mg M/W/F, Crestor 10 mg once weekly also caused joint aches  . Nickel Rash    Current Outpatient Prescriptions  Medication Sig Dispense Refill  . acetaminophen (TYLENOL) 500 MG tablet Take 1,000 mg by mouth daily as needed for pain.      Marland Kitchen aspirin 81 MG EC tablet Take 1 tablet (81 mg total) by mouth daily.    0  . calcium citrate-vitamin D (CITRACAL+D) 315-200 MG-UNIT per tablet Take 1 tablet by mouth daily.      . Cholecalciferol (VITAMIN D) 2000 UNITS tablet Take 1 tablet (2,000 Units total) by mouth daily.  30 tablet  0  . Coenzyme Q10 (CO Q10) 200 MG CAPS Take 200 mg by mouth daily.  30 capsule  0  . Colesevelam HCl (WELCHOL) 3.75 G PACK Mix 1 packet with 4-8 ounces of water or juice, and drink once daily  30 each  11  . ezetimibe (ZETIA) 10 MG tablet Take 1 tablet (10 mg total) by mouth daily.  30 tablet  1  . folic acid (FOLVITE) 1 MG tablet Take 1 tablet (1 mg total) by mouth daily.  30 tablet  1  . lisinopril (PRINIVIL,ZESTRIL) 5 MG tablet Take 1 tablet (5 mg total) by mouth 2 (two) times daily.  60  tablet  11  . metoprolol (LOPRESSOR) 50 MG tablet TAKE 1 TABLET BY MOUTH TWICE DAILY  60 tablet  1   No current facility-administered medications for this visit.    Past Medical History  Diagnosis Date  . Arthritis     a. bilat knees s/p L TKA 07/15/2012  . Anemia   . Hypertension   . Anxiety     Hx: of situational anxiety  . Headache(784.0)     Hx: of migraines  . Hyperlipidemia   . Panic attacks   . Ventricular fibrillation     a. 07/15/2012 in setting of R TKA  . Ischemic cardiomyopathy     Echo 07/19/12: EF 40-45%, basal to mid anteroseptal AK, distal anteroseptal HK, mild MR.   . Coronary artery disease     a. s/p VF arrest during TKR 7/14 2/2 NSTEMI => LHC with 3v CAD =>  s/p CABG 07/21/12 (LIMA-LAD, SVG-RI and OM1, SVG-distal RCA).   . Carotid stenosis     Pre-CABG Dopplers 07/19/12: Right 40-59%.  . DVT (deep venous thrombosis) 07/2012    Past Surgical History  Procedure Laterality Date  . Abdominal hysterectomy    . Dilation and curettage of uterus    . Total knee arthroplasty  Right 07/15/2012    Procedure: TOTAL KNEE ARTHROPLASTY;  Surgeon: [name], MD;  Location: [place];  Service: Orthopedics;  Laterality: Right;  drapes pulled back to provide cpr, new drape applied, protocol followed  . Coronary artery bypass graft N/A 07/21/2012    Procedure: CORONARY ARTERY BYPASS GRAFTING (CABG);  Surgeon: Grace Isaac, MD;  Location: [place];  Service: Open Heart Surgery;  Laterality: N/A;  Times 4 using left internal mammary artery and endoscopically harvested left saphenous vein.  . Intraoperative transesophageal echocardiogram N/A 07/21/2012    Procedure: INTRAOPERATIVE TRANSESOPHAGEAL ECHOCARDIOGRAM;  Surgeon: Grace Isaac, MD;  Location: [facility];  Service: Open Heart Surgery;  Laterality: N/A;    ROS:    As stated in the HPI and negative for all other systems.  PHYSICAL EXAM BP 130/80  Pulse 60  Ht 5\' 2"  (1.575 m)  Wt 154 lb (69.854 kg)  BMI 28.16 kg/m2 GENERAL:   Well appearing NECK:  No jugular venous distention, waveform within normal limits, carotid upstroke brisk and symmetric, no bruits, no thyromegaly LYMPHATICS:  No cervical, inguinal adenopathy LUNGS:  Clear to auscultation bilaterally BACK:  No CVA tenderness CHEST:  Well healed sternotomy scar. HEART:  PMI not displaced or sustained,S1 and S2 within normal limits, no S3, no S4, no clicks, no rubs, no murmurs ABD:  Flat, positive bowel sounds normal in frequency in pitch, no bruits, no rebound, no guarding, no midline pulsatile mass, no hepatomegaly, no splenomegaly EXT:  2 plus pulses throughout, trace edema, no cyanosis no clubbing   EKG:  Sinus rhythm, rate 60, axis within normal limits, poor anterior R wave progression, old inferior infarct, nonspecific inferior and lateral T-wave inversions. Low voltage.  No change from prevoius. 08/02/2013  ASSESSMENT AND PLAN  CAD:  The patient has no new sypmtoms.  No further cardiovascular testing is indicated.  We will continue with aggressive risk reduction and meds as listed.  DYSLIPIDEMIA:  I sent a message today to the lipid clinic as her LDL was slightly elevated.  She is being considered for the the SPIRE II trial.    CARDIOMYOPATHY:  Her EF was mildly reduced.  She will remain on the meds as listed.  I will check an echo in 4 months to reevaluate her EF.   CAROTID STENOSIS.  This was mild.  She had a follow up Doppler today.  I will follow up these results.

## 2013-08-02 NOTE — Progress Notes (Signed)
Carotid Duplex Completed. Tajh Livsey, BS, RDMS, RVT  

## 2013-08-02 NOTE — Patient Instructions (Signed)
Your physician recommends that you schedule a follow-up appointment in: December to see Dr Percival Spanish  We will also schedule an ECHO to be done at that same day

## 2013-08-10 NOTE — Progress Notes (Signed)
Pt. Called with her results of her carotid doppler

## 2013-08-18 ENCOUNTER — Other Ambulatory Visit: Payer: Self-pay

## 2013-08-18 ENCOUNTER — Ambulatory Visit (HOSPITAL_COMMUNITY): Payer: Medicare Other

## 2013-08-18 MED ORDER — METOPROLOL TARTRATE 50 MG PO TABS: ORAL_TABLET | ORAL | Status: DC

## 2013-09-15 ENCOUNTER — Other Ambulatory Visit: Payer: Self-pay

## 2013-09-15 MED ORDER — EZETIMIBE 10 MG PO TABS: 10.0000 mg | ORAL_TABLET | Freq: Every day | ORAL | Status: DC

## 2013-10-08 ENCOUNTER — Other Ambulatory Visit: Payer: Self-pay | Admitting: Cardiology

## 2013-11-11 ENCOUNTER — Other Ambulatory Visit: Payer: Self-pay

## 2013-11-11 MED ORDER — EZETIMIBE 10 MG PO TABS: 10.0000 mg | ORAL_TABLET | Freq: Every day | ORAL | Status: DC

## 2013-11-12 ENCOUNTER — Telehealth: Payer: Self-pay | Admitting: Cardiology

## 2013-11-15 NOTE — Telephone Encounter (Signed)
Closed encounter °

## 2013-12-11 ENCOUNTER — Other Ambulatory Visit: Payer: Self-pay | Admitting: Cardiology

## 2013-12-11 NOTE — Telephone Encounter (Signed)
Rx was sent to pharmacy electronically. OV 12/21/13

## 2013-12-16 ENCOUNTER — Encounter (HOSPITAL_COMMUNITY): Payer: Self-pay | Admitting: Cardiology

## 2013-12-21 ENCOUNTER — Other Ambulatory Visit: Payer: Self-pay | Admitting: *Deleted

## 2013-12-21 ENCOUNTER — Ambulatory Visit (INDEPENDENT_AMBULATORY_CARE_PROVIDER_SITE_OTHER): Payer: Medicare Other | Admitting: Cardiology

## 2013-12-21 ENCOUNTER — Encounter: Payer: Self-pay | Admitting: Cardiology

## 2013-12-21 VITALS — BP 130/84 | HR 70 | Ht 62.0 in | Wt 164.2 lb

## 2013-12-21 DIAGNOSIS — I255 Ischemic cardiomyopathy: Secondary | ICD-10-CM

## 2013-12-21 DIAGNOSIS — I10 Essential (primary) hypertension: Secondary | ICD-10-CM

## 2013-12-21 MED ORDER — EZETIMIBE 10 MG PO TABS: 10.0000 mg | ORAL_TABLET | Freq: Every day | ORAL | Status: DC

## 2013-12-21 NOTE — Patient Instructions (Signed)
Your physician recommends that you schedule a follow-up appointment in: July and will have an echo same day

## 2013-12-21 NOTE — Progress Notes (Signed)
HPI Susan Welch returns for followup after bypass. She was admitted for total knee replacement and had a V. Fib arrest and was found to have three-vessel disease as described. She has a mildly reduced ejection fraction. In rehabilitation she also had a DVT.   Since I Iast saw her she has done well.  She is in the SPIRE II study.  She is on Welchol and this is likely causing some dyspepsia.  She is exercising.  The patient denies any new symptoms such as chest discomfort, neck or arm discomfort. There has been no new shortness of breath, PND or orthopnea. There have been no reported palpitations, presyncope or syncope.   She is gaining weight but not swelling.  Allergies  Allergen Reactions  . Statins Other (See Comments)    Joint pain with Lipitor daily, Pravastatin 10 mg qd and Pravastatin 10 mg M/W/F, Crestor 10 mg once weekly also caused joint aches  . Nickel Rash    Current Outpatient Prescriptions  Medication Sig Dispense Refill  . acetaminophen (TYLENOL) 500 MG tablet Take 1,000 mg by mouth daily as needed for pain.    Marland Kitchen aspirin 81 MG EC tablet Take 1 tablet (81 mg total) by mouth daily.  0  . calcium citrate-vitamin D (CITRACAL+D) 315-200 MG-UNIT per tablet Take 1 tablet by mouth daily.    . Cholecalciferol (VITAMIN D) 2000 UNITS tablet Take 1 tablet (2,000 Units total) by mouth daily. 30 tablet 0  . Coenzyme Q10 (CO Q10) 200 MG CAPS Take 200 mg by mouth daily. 30 capsule 0  . Colesevelam HCl (WELCHOL) 3.75 G PACK Mix 1 packet with 4-8 ounces of water or juice, and drink once daily 30 each 11  . ezetimibe (ZETIA) 10 MG tablet Take 1 tablet (10 mg total) by mouth daily. 30 tablet 1  . folic acid ([employer] FOLIC ACID) 213 MCG tablet Take 400 mcg by mouth daily.    Marland Kitchen lisinopril (PRINIVIL,ZESTRIL) 5 MG tablet Take 1 tablet (5 mg total) by mouth 2 (two) times daily. 60 tablet 11  . metoprolol (LOPRESSOR) 50 MG tablet TAKE 1 TABLET BY MOUTH TWICE DAILY 60 tablet 5   No current  facility-administered medications for this visit.    Past Medical History  Diagnosis Date  . Arthritis     a. bilat knees s/p L TKA 07/15/2012  . Anemia   . Hypertension   . Anxiety     Hx: of situational anxiety  . Headache(784.0)     Hx: of migraines  . Hyperlipidemia   . Panic attacks   . Ventricular fibrillation     a. 07/15/2012 in setting of R TKA  . Ischemic cardiomyopathy     Echo 07/19/12: EF 40-45%, basal to mid anteroseptal AK, distal anteroseptal HK, mild MR.   . Coronary artery disease     a. s/p VF arrest during TKR 7/14 2/2 NSTEMI => LHC with 3v CAD =>  s/p CABG 07/21/12 (LIMA-LAD, SVG-RI and OM1, SVG-distal RCA).   . Carotid stenosis     Pre-CABG Dopplers 07/19/12: Right 40-59%.  . DVT (deep venous thrombosis) 07/2012    Past Surgical History  Procedure Laterality Date  . Abdominal hysterectomy    . Dilation and curettage of uterus    . Total knee arthroplasty Right 07/15/2012    Procedure: TOTAL KNEE ARTHROPLASTY;  Surgeon: [name], MD;  Location: [facility];  Service: Orthopedics;  Laterality: Right;  drapes pulled back to provide cpr, new drape applied,  protocol followed  . Coronary artery bypass graft N/A 07/21/2012    Procedure: CORONARY ARTERY BYPASS GRAFTING (CABG);  Surgeon: Grace Isaac, MD;  Location: [place];  Service: Open Heart Surgery;  Laterality: N/A;  Times 4 using left internal mammary artery and endoscopically harvested left saphenous vein.  . Intraoperative transesophageal echocardiogram N/A 07/21/2012    Procedure: INTRAOPERATIVE TRANSESOPHAGEAL ECHOCARDIOGRAM;  Surgeon: Grace Isaac, MD;  Location: [place];  Service: Open Heart Surgery;  Laterality: N/A;  . Left heart catheterization with coronary angiogram N/A 07/17/2012    Procedure: LEFT HEART CATHETERIZATION WITH CORONARY ANGIOGRAM;  Surgeon: Minus Breeding, MD;  Location: [facility] CATH LAB;  Service: Cardiovascular;  Laterality: N/A;    ROS:    As stated in the HPI and negative for all other  systems.  PHYSICAL EXAM BP 130/84 mmHg  Pulse 70  Ht 5\' 2"  (1.575 m)  Wt 164 lb 3.2 oz (74.481 kg)  BMI 30.03 kg/m2 GENERAL:  Well appearing NECK:  No jugular venous distention, waveform within normal limits, carotid upstroke brisk and symmetric, no bruits, no thyromegaly LYMPHATICS:  No cervical, inguinal adenopathy LUNGS:  Clear to auscultation bilaterally BACK:  No CVA tenderness CHEST:  Well healed sternotomy scar. HEART:  PMI not displaced or sustained,S1 and S2 within normal limits, no S3, no S4, no clicks, no rubs, no murmurs ABD:  Flat, positive bowel sounds normal in frequency in pitch, no bruits, no rebound, no guarding, no midline pulsatile mass, no hepatomegaly, no splenomegaly EXT:  2 plus pulses throughout, trace edema, no cyanosis no clubbing   EKG:  Sinus rhythm, rate 70, axis within normal limits, poor anterior R wave progression, old inferior infarct, nonspecific inferior and lateral T-wave inversions. Low voltage.  No change from prevoius. 12/21/2013  ASSESSMENT AND PLAN  CAD:  The patient has no new sypmtoms.  No further cardiovascular testing is indicated.  We will continue with aggressive risk reduction and meds as listed.  DYSLIPIDEMIA:  She will remain on Welchol and take Zantac that might be causing some of the dyspepsia.    CARDIOMYOPATHY:  Her EF was mildly reduced.  She will remain on the meds as listed.  I will check an echo when she returns in July of next year.   CAROTID STENOSIS.  This was trace and no follow up is scheduled.

## 2014-03-23 ENCOUNTER — Encounter: Payer: Self-pay | Admitting: *Deleted

## 2014-03-25 ENCOUNTER — Ambulatory Visit (INDEPENDENT_AMBULATORY_CARE_PROVIDER_SITE_OTHER): Payer: Medicare Other | Admitting: Family Medicine

## 2014-03-25 VITALS — BP 132/76 | HR 69 | Temp 98.2°F | Resp 16 | Ht 62.0 in | Wt 167.0 lb

## 2014-03-25 DIAGNOSIS — K1120 Sialoadenitis, unspecified: Secondary | ICD-10-CM | POA: Diagnosis not present

## 2014-03-25 MED ORDER — FLUCONAZOLE 150 MG PO TABS: 150.0000 mg | ORAL_TABLET | Freq: Once | ORAL | Status: DC

## 2014-03-25 MED ORDER — AMOXICILLIN-POT CLAVULANATE 875-125 MG PO TABS
1.0000 | ORAL_TABLET | Freq: Two times a day (BID) | ORAL | Status: DC
Start: 2014-03-25 — End: 2014-06-23

## 2014-03-25 NOTE — Progress Notes (Signed)
69 yo woman with swollen left inner cheek and unusual feeling of gland swelling on the left side.  Patient has undergone CABG x 4 in past and now sees Dr. Percival Spanish.  Objective:  NAD Mild left cheek swelling with buccal leukoplakia and redness adjacent to molars where she has a fractured tooth.  TM's normal Neck:  Mild left cervical adenopathy  Assessment:  Parotitis following viral illness  Plan:  augmentin and diflucan Call if not significantly better Monday.  Robyn Haber, MD  This chart was scribed in my presence and reviewed by me personally.    ICD-9-CM ICD-10-CM   1. Parotitis 527.2 K11.20 amoxicillin-clavulanate (AUGMENTIN) 875-125 MG per tablet     fluconazole (DIFLUCAN) 150 MG tablet     Signed, Robyn Haber, MD

## 2014-03-25 NOTE — Patient Instructions (Signed)
Parotitis °Parotitis is soreness and inflammation of one or both parotid glands. The parotid glands produce saliva. They are located on each side of the face, below and in front of the earlobes. The saliva produced comes out of tiny openings (ducts) inside the cheeks. In most cases, parotitis goes away over time or with treatment. If your parotitis is caused by certain long-term (chronic) diseases, it may come back again.  °CAUSES  °Parotitis can be caused by: °· Viral infections. Mumps is one viral infection that can cause parotitis. °· Bacterial infections. °· Blockage of the salivary ducts due to a salivary stone. °· Narrowing of the salivary ducts. °· Swelling of the salivary ducts. °· Dehydration. °· Autoimmune conditions, such as sarcoidosis or Sjogren syndrome. °· Air from activities such as scuba diving, glass blowing, or playing an instrument (rare). °· Human immunodeficiency virus (HIV) or acquired immunodeficiency syndrome (AIDS). °· Tuberculosis. °SIGNS AND SYMPTOMS  °· The ears may appear to be pushed up and out from their normal position. °· Redness (erythema) of the skin over the parotid glands. °· Pain and tenderness over the parotid glands. °· Swelling in the parotid gland area. °· Yellowish-white fluid (pus) coming from the ducts inside the cheeks. °· Dry mouth. °· Bad taste in the mouth. °DIAGNOSIS  °Your health care provider may determine that you have parotitis based on your symptoms and a physical exam. A sample of fluid may also be taken from the parotid gland and tested to find the cause of your infection. X-rays or computed tomography (CT) scans may be taken if your health care provider thinks you might have a salivary stone blocking your salivary duct. °TREATMENT  °Treatment varies depending upon the cause of your parotitis. If your parotitis is caused by mumps, no treatment is needed. The condition will go away on its own after 7 to 10 days. In other cases, treatment may  include: °· Antibiotic medicine if your infection was caused by bacteria. °· Pain medicines. °· Gland massage. °· Eating sour candy to increase your saliva production. °· Removal of salivary stones. Your health care provider may flush stones out with fluids or remove them with tweezers. °· Surgery to remove the parotid glands. °HOME CARE INSTRUCTIONS  °· If you were prescribed an antibiotic medicine, finish it all even if you start to feel better. °· Put warm compresses on the sore area. °· Take medicines only as directed by your health care provider. °· Drink enough fluids to keep your urine clear or pale yellow. °SEEK IMMEDIATE MEDICAL CARE IF:  °· You have increasing pain or swelling that is not controlled with medicine. °· You have a fever. °MAKE SURE YOU: °· Understand these instructions. °· Will watch your condition. °· Will get help right away if you are not doing well or get worse. °Document Released: 06/15/2001 Document Revised: 05/10/2013 Document Reviewed: 11/19/2010 °ExitCare® Patient Information ©2015 [employer]. This information is not intended to replace advice given to you by your health care provider. Make sure you discuss any questions you have with your health care provider. ° °

## 2014-04-14 ENCOUNTER — Encounter: Payer: Self-pay | Admitting: *Deleted

## 2014-04-14 ENCOUNTER — Telehealth: Payer: Self-pay | Admitting: *Deleted

## 2014-04-14 NOTE — Telephone Encounter (Signed)
Patient dropped of surgical clearance for dental procedure. Paper was given to Dr.Hochrein, which was filled out and signed, then faxed back to [facility]. Patient was cleared for procedure

## 2014-04-14 NOTE — Progress Notes (Signed)
Patient ID: [name], female   DOB: [birth year], 69 y.o.   MRN: [id number] [facility] Informed Consent   Subject Name:  Susan Welch Subject met inclusion and exclusion criteria. The informed consent form, study requirements and expectations were reviewed with the subject and questions and concerns were addressed prior to the signing of the consent form. The subject verbalized understanding of the trail requirements. The subject agreed to participate in the [facility] trial and signed the informed consent. The informed consent was obtained prior to performance of any protocol-specific procedures for the subject. A copy of the signed informed consent was given to the subject and a copy was placed in the subject's medical record.   Jake Bathe, RN 11/24/2013

## 2014-06-14 ENCOUNTER — Encounter: Payer: Self-pay | Admitting: *Deleted

## 2014-06-23 ENCOUNTER — Encounter: Payer: Self-pay | Admitting: Cardiology

## 2014-06-23 ENCOUNTER — Ambulatory Visit (INDEPENDENT_AMBULATORY_CARE_PROVIDER_SITE_OTHER): Payer: Medicare Other | Admitting: Cardiology

## 2014-06-23 VITALS — BP 150/100 | HR 68 | Ht 62.0 in | Wt 168.9 lb

## 2014-06-23 DIAGNOSIS — Z951 Presence of aortocoronary bypass graft: Secondary | ICD-10-CM

## 2014-06-23 DIAGNOSIS — I10 Essential (primary) hypertension: Secondary | ICD-10-CM | POA: Diagnosis not present

## 2014-06-23 MED ORDER — LISINOPRIL 5 MG PO TABS
5.0000 mg | ORAL_TABLET | Freq: Two times a day (BID) | ORAL | Status: DC
Start: 2014-06-23 — End: 2015-06-30

## 2014-06-23 MED ORDER — METOPROLOL TARTRATE 50 MG PO TABS: 50.0000 mg | ORAL_TABLET | Freq: Two times a day (BID) | ORAL | Status: DC

## 2014-06-23 NOTE — Patient Instructions (Signed)
Medication Instructions:   I have refilled your lisinopril and metoprolol.  I sent the refills to [facility].   Labwork:  n/a  Testing/Procedures:   Echocardiogram. Echocardiography is a painless test that uses sound waves to create images of your heart. It provides your doctor with information about the size and shape of your heart and how well your heart's chambers and valves are working. This procedure takes approximately one hour. There are no restrictions for this procedure.    Follow-Up:  1 year with Dr Percival Spanish  Any Other Special Instructions Will Be Listed Below (If Applicable).

## 2014-06-23 NOTE — Progress Notes (Signed)
HPI Susan Welch returns for followup after bypass. She was admitted for total knee replacement and had a V. Fib arrest and was found to have three-vessel disease as described. She has a mildly reduced ejection fraction. She has been doing well. She's been little limited in her walking because she needs to get her left knee operated on. With her current level of activity which can be 4-10,000 steps daily she denies any cardiovascular symptoms. She does not have any chest pressure, neck or arm discomfort. She's not had any palpitations, presyncope or syncope. She has no shortness of breath, PND or orthopnea. She's had no weight gain or edema.    Allergies  Allergen Reactions  . Statins Other (See Comments)    Joint pain with Lipitor daily, Pravastatin 10 mg qd and Pravastatin 10 mg M/W/F, Crestor 10 mg once weekly also caused joint aches  . Nickel Rash    Current Outpatient Prescriptions  Medication Sig Dispense Refill  . acetaminophen (TYLENOL) 500 MG tablet Take 1,000 mg by mouth daily as needed for pain.    Marland Kitchen aspirin 81 MG EC tablet Take 1 tablet (81 mg total) by mouth daily.  0  . calcium citrate-vitamin D (CITRACAL+D) 315-200 MG-UNIT per tablet Take 1 tablet by mouth daily.    . Cholecalciferol (VITAMIN D) 2000 UNITS tablet Take 1 tablet (2,000 Units total) by mouth daily. 30 tablet 0  . Coenzyme Q10 (CO Q10) 200 MG CAPS Take 200 mg by mouth daily. 30 capsule 0  . ezetimibe (ZETIA) 10 MG tablet Take 1 tablet (10 mg total) by mouth daily. 30 tablet 6  . folic acid ([employer]S FOLIC ACID) 269 MCG tablet Take 400 mcg by mouth daily.    Marland Kitchen lisinopril (PRINIVIL,ZESTRIL) 5 MG tablet Take 1 tablet (5 mg total) by mouth 2 (two) times daily. 60 tablet 11  . metoprolol (LOPRESSOR) 50 MG tablet TAKE 1 TABLET BY MOUTH TWICE DAILY 60 tablet 5   No current facility-administered medications for this visit.    Past Medical History  Diagnosis Date  . Arthritis     a. bilat knees s/p L TKA 07/15/2012  .  Anemia   . Hypertension   . Anxiety     Hx: of situational anxiety  . Headache(784.0)     Hx: of migraines  . Hyperlipidemia   . Panic attacks   . Ventricular fibrillation     a. 07/15/2012 in setting of R TKA  . Ischemic cardiomyopathy     Echo 07/19/12: EF 40-45%, basal to mid anteroseptal AK, distal anteroseptal HK, mild MR.   . Coronary artery disease     a. s/p VF arrest during TKR 7/14 2/2 NSTEMI => LHC with 3v CAD =>  s/p CABG 07/21/12 (LIMA-LAD, SVG-RI and OM1, SVG-distal RCA).   . Carotid stenosis     Pre-CABG Dopplers 07/19/12: Right 40-59%.  . DVT (deep venous thrombosis) 07/2012    Past Surgical History  Procedure Laterality Date  . Abdominal hysterectomy    . Dilation and curettage of uterus    . Total knee arthroplasty Right 07/15/2012    Procedure: TOTAL KNEE ARTHROPLASTY;  Surgeon: [name], MD;  Location: [place];  Service: Orthopedics;  Laterality: Right;  drapes pulled back to provide cpr, new drape applied, protocol followed  . Coronary artery bypass graft N/A 07/21/2012    Procedure: CORONARY ARTERY BYPASS GRAFTING (CABG);  Surgeon: Grace Isaac, MD;  Location: [place];  Service: Open Heart Surgery;  Laterality: N/A;  Times 4 using left internal mammary artery and endoscopically harvested left saphenous vein.  . Intraoperative transesophageal echocardiogram N/A 07/21/2012    Procedure: INTRAOPERATIVE TRANSESOPHAGEAL ECHOCARDIOGRAM;  Surgeon: Grace Isaac, MD;  Location: [place];  Service: Open Heart Surgery;  Laterality: N/A;  . Left heart catheterization with coronary angiogram N/A 07/17/2012    Procedure: LEFT HEART CATHETERIZATION WITH CORONARY ANGIOGRAM;  Surgeon: Minus Breeding, MD;  Location: [facility] CATH LAB;  Service: Cardiovascular;  Laterality: N/A;    ROS:    As stated in the HPI and negative for all other systems.  PHYSICAL EXAM BP 150/100 mmHg  Pulse 68  Ht 5\' 2"  (1.575 m)  Wt 168 lb 14.4 oz (76.613 kg)  BMI 30.88 kg/m2 GENERAL:  Well  appearing NECK:  No jugular venous distention, waveform within normal limits, carotid upstroke brisk and symmetric, no bruits, no thyromegaly LYMPHATICS:  No cervical, inguinal adenopathy LUNGS:  Clear to auscultation bilaterally BACK:  No CVA tenderness CHEST:  Well healed sternotomy scar. HEART:  PMI not displaced or sustained,S1 and S2 within normal limits, no S3, no S4, no clicks, no rubs, no murmurs ABD:  Flat, positive bowel sounds normal in frequency in pitch, no bruits, no rebound, no guarding, no midline pulsatile mass, no hepatomegaly, no splenomegaly EXT:  2 plus pulses throughout, no edema, no cyanosis no clubbing   ASSESSMENT AND PLAN  CAD:  The patient has no new sypmtoms.  No further cardiovascular testing is indicated.  We will continue with aggressive risk reduction and meds as listed.  DYSLIPIDEMIA:  She is followed in the SPIRE II study.   Of note she stopped taking WelChol in part because it might have been causing some GI problems. This cleared up with Zantac. She will remain off this medication now that she is involved in the trial and they are following her lipid levels.  CARDIOMYOPATHY:  Her EF was mildly reduced.  She will remain on the meds as listed.  I will check an echo .  CAROTID STENOSIS.  This was trace and no follow up is scheduled.   PREOP:  She would be at acceptable risk for planned surgery without further cardiovascular testing.

## 2014-07-05 ENCOUNTER — Ambulatory Visit (HOSPITAL_COMMUNITY)
Admission: RE | Admit: 2014-07-05 | Discharge: 2014-07-05 | Disposition: A | Discharge status: Home / Self Care | Payer: Medicare Other | Source: Ambulatory Visit | Attending: Cardiovascular Disease | Admitting: Cardiovascular Disease

## 2014-07-05 DIAGNOSIS — Z951 Presence of aortocoronary bypass graft: Secondary | ICD-10-CM

## 2014-07-05 DIAGNOSIS — I10 Essential (primary) hypertension: Secondary | ICD-10-CM

## 2014-07-06 IMAGING — CR DG CHEST 1V PORT
1 series · 1 of 1 positions shown · non-contrast
Comparison: 07/07/2012

CLINICAL DATA: Congestive heart failure.

PORTABLE CHEST - 1 VIEW

[AP]
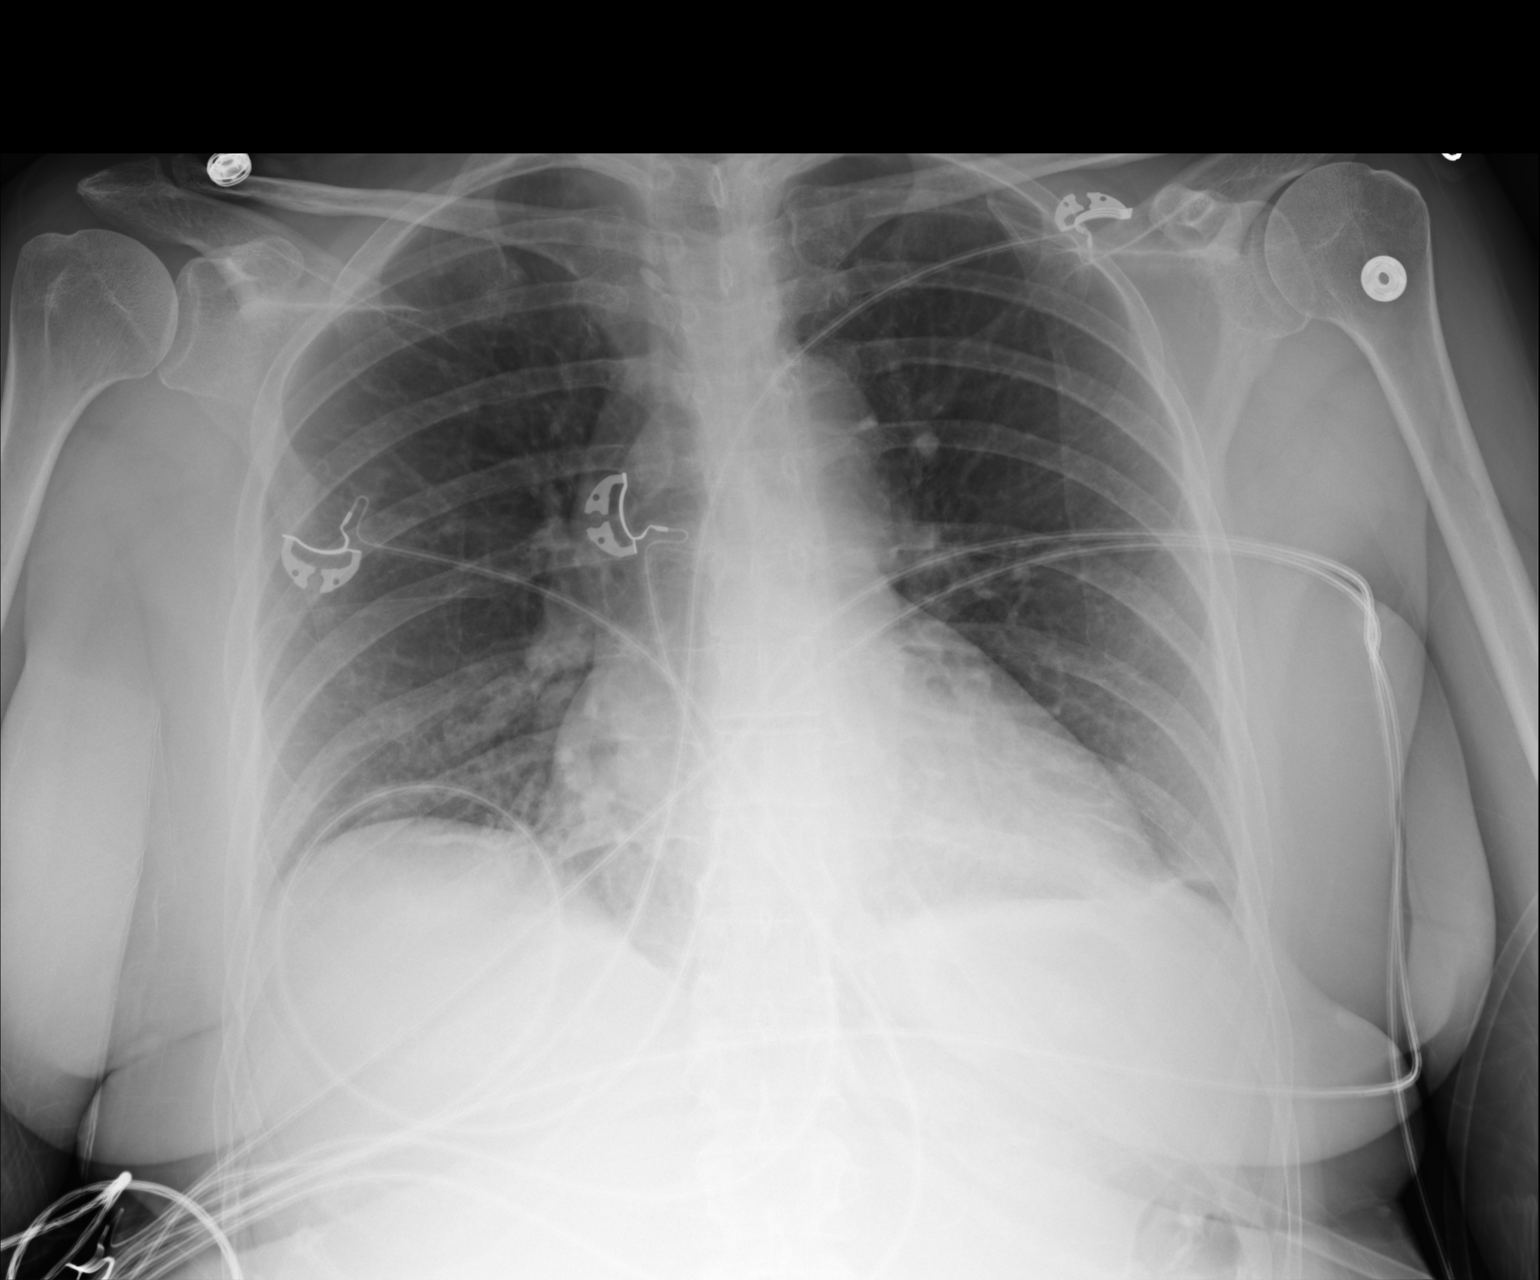

[1 of 1 positions shown; findings below may reference images not displayed]

FINDINGS: Mild cardiac enlargement with normal pulmonary
vascularity.  No edema.  Linear atelectasis in the left lung bases
is new since previous study.  No developing airspace disease or
consolidation.  No blunting of costophrenic angles.  No
pneumothorax.
IMPRESSION: Shallow inspiration with developing linear atelectasis in the lung
bases.  Mild cardiac enlargement.

## 2014-07-08 IMAGING — CR DG CHEST 1V PORT
1 series · 1 of 1 positions shown · non-contrast
Comparison: Chest x-ray 07/19/2012.

CLINICAL DATA: Status post CABG.

PORTABLE CHEST - 1 VIEW

[AP]
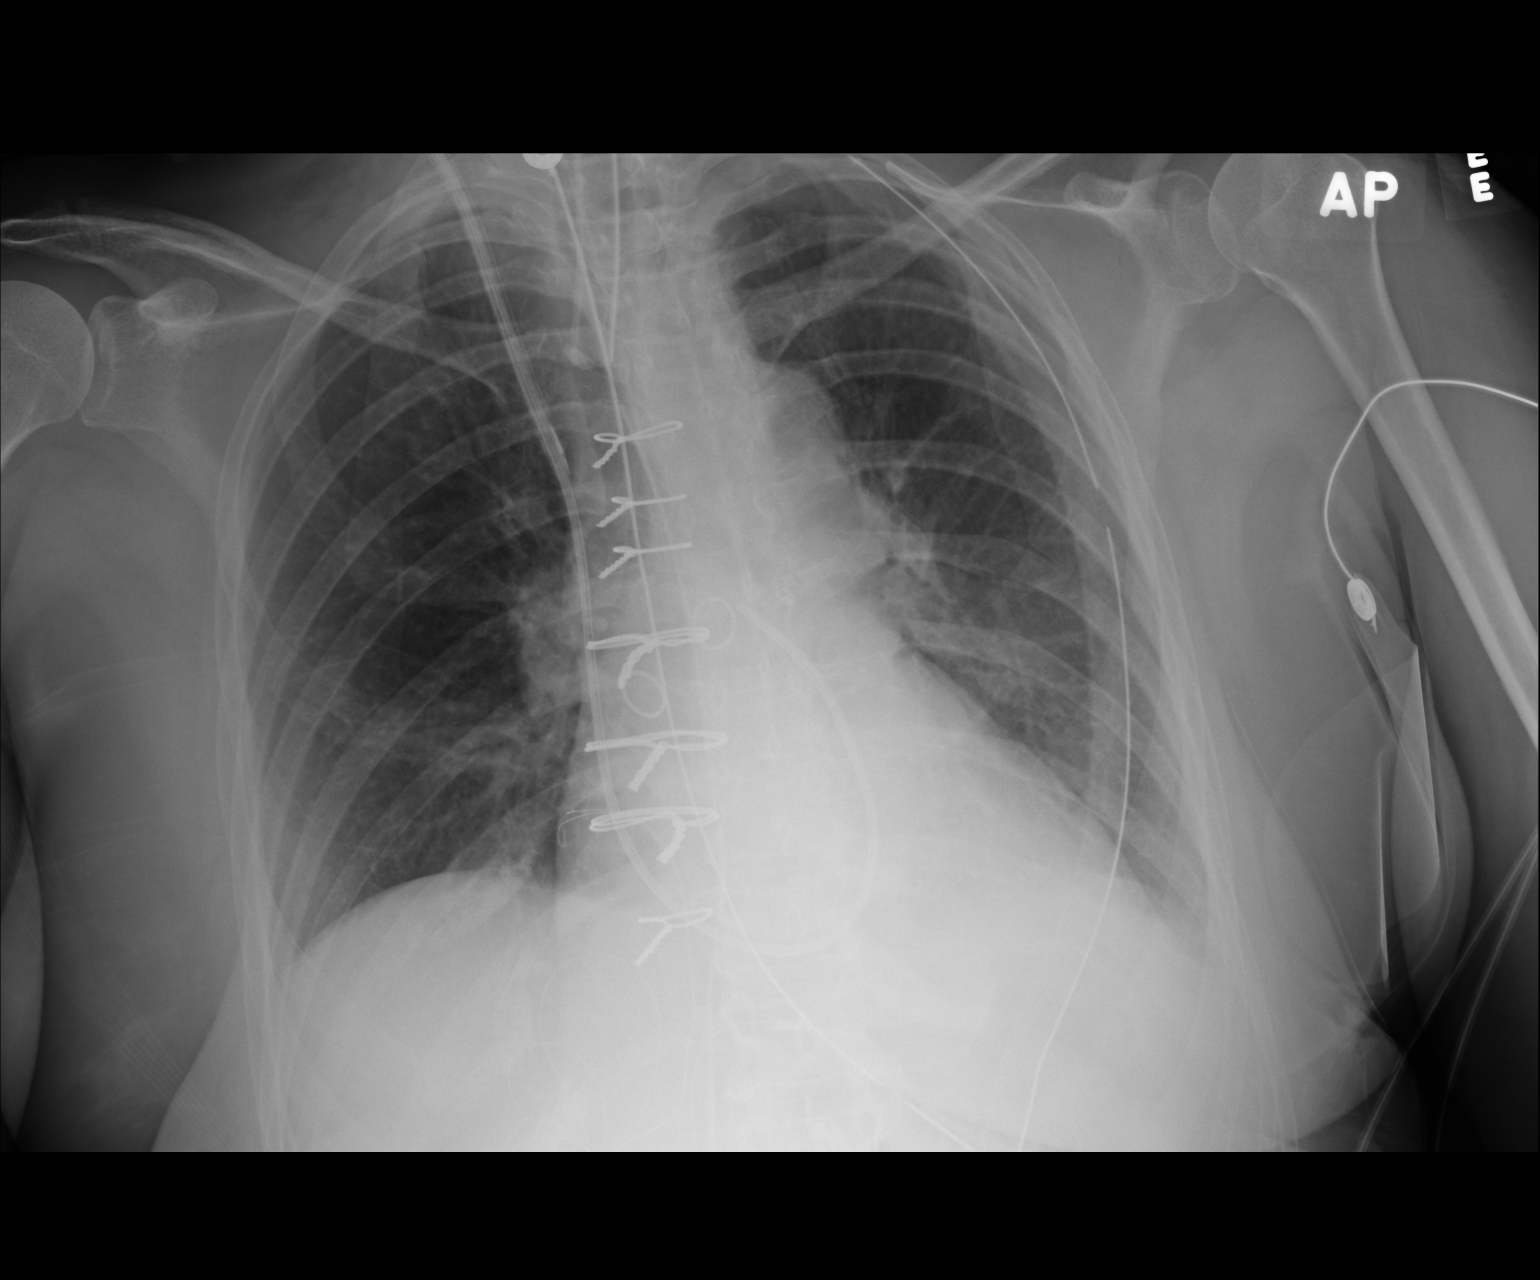

[1 of 1 positions shown; findings below may reference images not displayed]

FINDINGS: An endotracheal tube is in place with tip 4.7 cm above
the carina. Nasogastric tube extends into the mid stomach.  Right
internal jugular central venous Cordis through which a Swan Ganz
catheter has been passed into the distal pulmonic trunk.  Status
post median sternotomy for CABG.  Left-sided chest tube in position
with tip directed into the left apex.  Lung volumes are slightly
low with bibasilar opacities most compatible with mild
postoperative subsegmental atelectasis.  Small left pleural
effusion.  No pneumothorax.  No evidence of pulmonary edema.
Cardiomediastinal silhouette is within normal limits. Epicardial
pacing wires are noted.
IMPRESSION: 1.  Postoperative changes and support apparatus, as above.
2.  Low lung volumes with mild bibasilar (left greater than right)
subsegmental atelectasis and small left pleural effusion.

## 2014-07-09 IMAGING — CR DG CHEST 1V PORT
1 series · 1 of 1 positions shown · non-contrast
Comparison: Portable chest x-ray of 07/21/2012

CLINICAL DATA: Postop CABG, follow-up

PORTABLE CHEST - 1 VIEW

[AP]
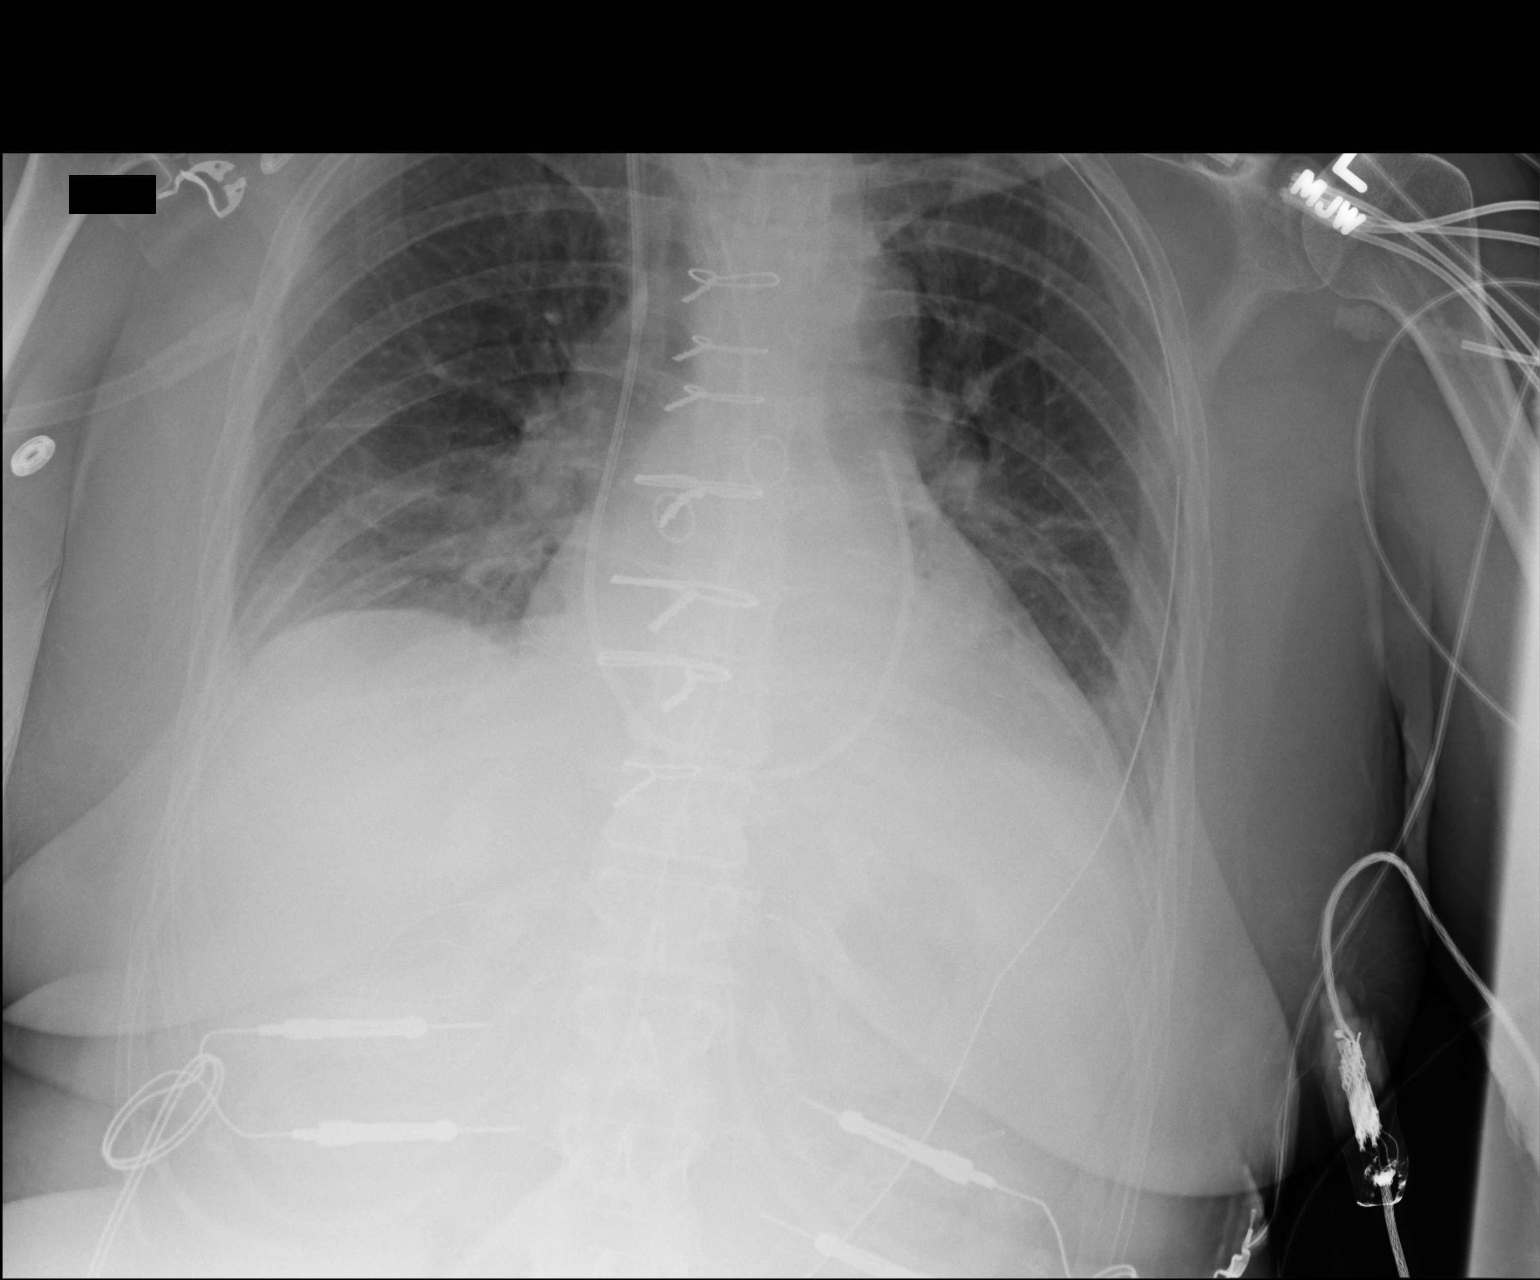

[1 of 1 positions shown; findings below may reference images not displayed]

FINDINGS: The endotracheal tube appears to have been removed.
Aeration has diminished slightly.  There is cardiomegaly present
with basilar atelectasis and probable mild pulmonary vascular
congestion remaining.  Left chest tube is present and no
pneumothorax is seen.  The Swan-Ganz catheter tip is in the
pulmonary outflow tract region.
IMPRESSION: Endotracheal tube removed with decrease in aeration and increase in
basilar atelectasis.  Stable mild congestion.

## 2014-07-10 IMAGING — CR DG CHEST 1V PORT
1 series · 1 of 1 positions shown · non-contrast
Comparison: Plain film chest 07/22/2012.

CLINICAL DATA: Status post CABG.

PORTABLE CHEST - 1 VIEW

[AP]
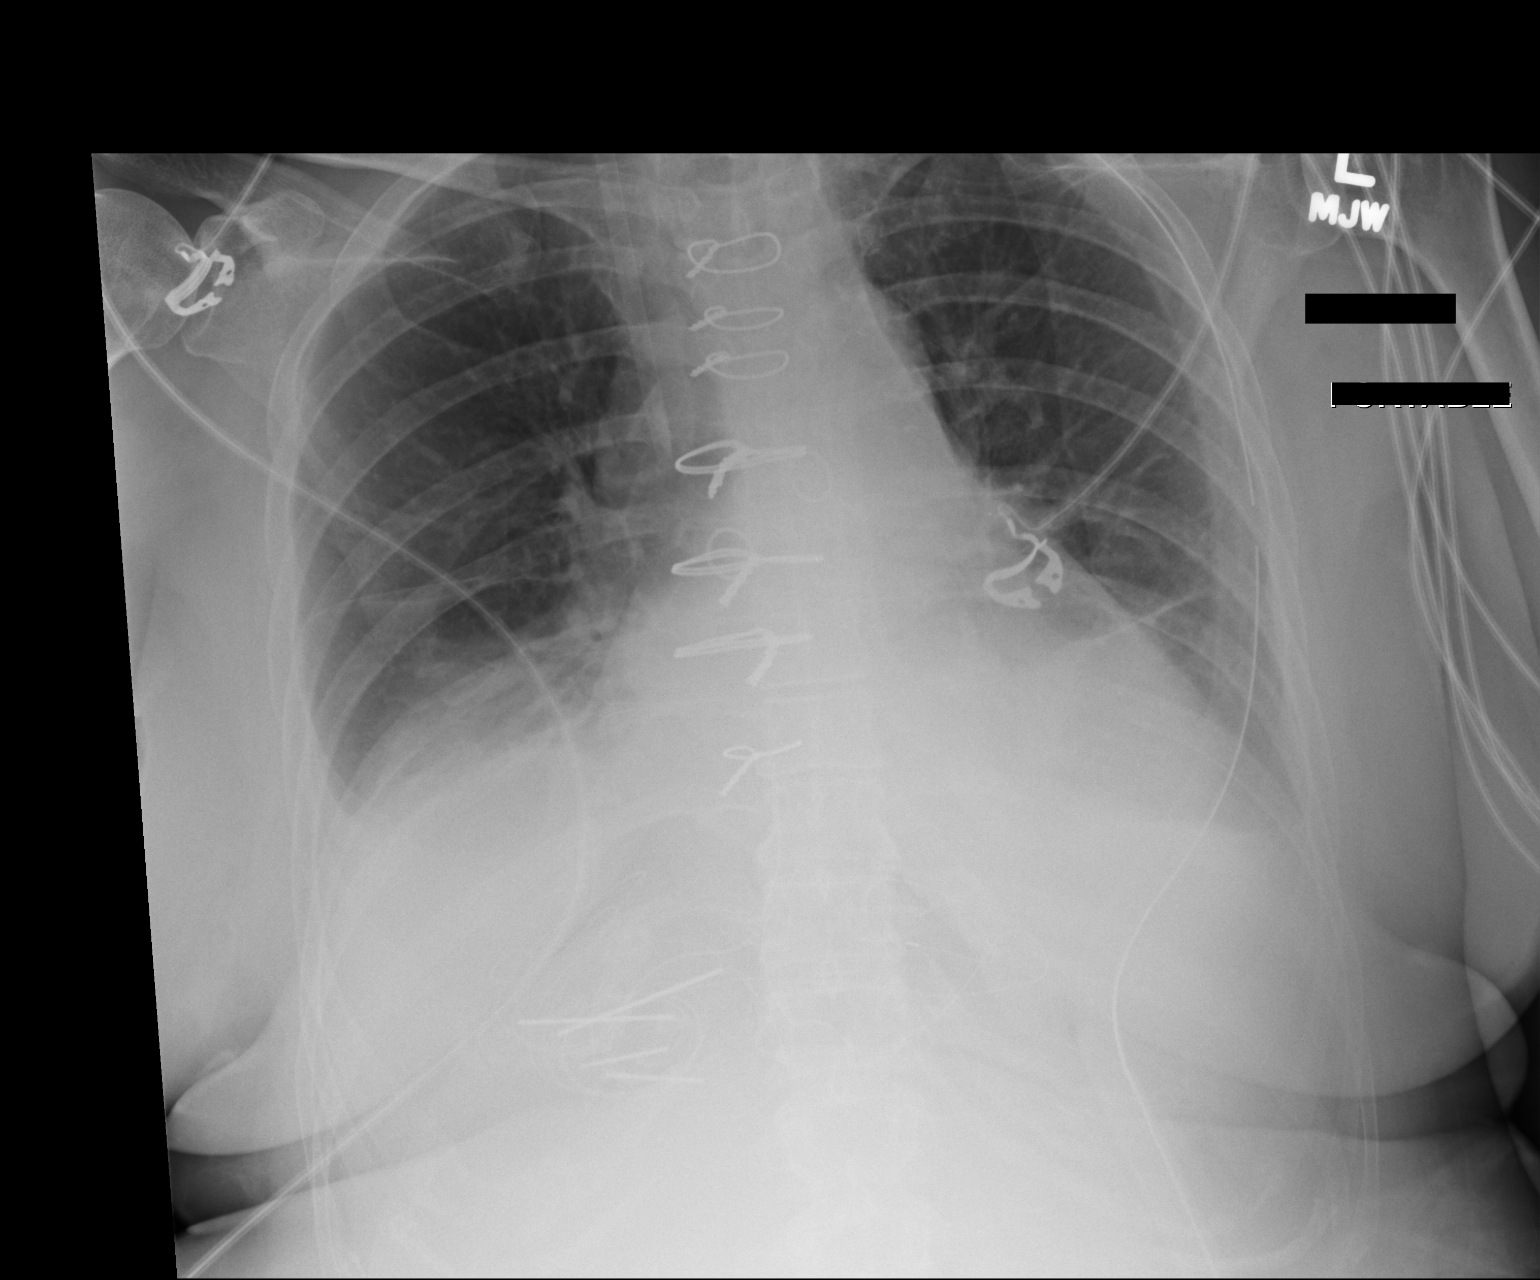

[1 of 1 positions shown; findings below may reference images not displayed]

FINDINGS: Swan-Ganz catheter has been removed with a right IJ
sheath remaining in place.  Left chest tube is again identified.
There is a tiny left apical pneumothorax.  Small bilateral pleural
effusions, larger on the right, have increased and there is some
associated basilar atelectasis.
IMPRESSION: 1.  Tiny left apical pneumothorax with a left chest tube in place.
2.  Some increase in small bilateral pleural effusions and basilar
atelectasis, greater on the right.

## 2014-07-21 ENCOUNTER — Telehealth: Payer: Self-pay | Admitting: *Deleted

## 2014-07-21 NOTE — Telephone Encounter (Signed)
Requesting surgical clearance:   1. Type of surgery: Left total knee replacement  2. Surgeon: Kathryne Hitch  3. Surgical date: Pending  4. Medications that need to be help: None  As per Dr Percival Spanish patient is cleared for her upcoming Surgical Procedure , stop Aspirin as recommend by Dr Percell Miller

## 2014-07-31 ENCOUNTER — Encounter: Payer: Self-pay | Admitting: Family Medicine

## 2014-07-31 ENCOUNTER — Telehealth: Payer: Self-pay | Admitting: Family Medicine

## 2014-07-31 NOTE — Telephone Encounter (Signed)
Call --- I received pre-operative clearance form from Dr. Percell Miller for total knee replacement.  I see that patient has received cardiac clearance from Dr. Percival Spanish.  I cannot clear from a primary care perspective because Dr. Leward Quan has not seen pt in over two years.  Recommend office visit for pre-operative clearance.  When is surgery scheduled?

## 2014-08-01 ENCOUNTER — Ambulatory Visit (INDEPENDENT_AMBULATORY_CARE_PROVIDER_SITE_OTHER): Payer: Medicare Other

## 2014-08-01 ENCOUNTER — Ambulatory Visit (INDEPENDENT_AMBULATORY_CARE_PROVIDER_SITE_OTHER): Payer: Medicare Other | Admitting: Family Medicine

## 2014-08-01 VITALS — BP 130/90 | HR 72 | Temp 98.3°F | Resp 18 | Ht 62.5 in | Wt 169.8 lb

## 2014-08-01 DIAGNOSIS — I25810 Atherosclerosis of coronary artery bypass graft(s) without angina pectoris: Secondary | ICD-10-CM

## 2014-08-01 DIAGNOSIS — Z01818 Encounter for other preprocedural examination: Secondary | ICD-10-CM

## 2014-08-01 DIAGNOSIS — M1712 Unilateral primary osteoarthritis, left knee: Secondary | ICD-10-CM

## 2014-08-01 DIAGNOSIS — Z6830 Body mass index (BMI) 30.0-30.9, adult: Secondary | ICD-10-CM

## 2014-08-01 DIAGNOSIS — Z23 Encounter for immunization: Secondary | ICD-10-CM | POA: Diagnosis not present

## 2014-08-01 DIAGNOSIS — E669 Obesity, unspecified: Secondary | ICD-10-CM

## 2014-08-01 DIAGNOSIS — R0683 Snoring: Secondary | ICD-10-CM

## 2014-08-01 DIAGNOSIS — Z87891 Personal history of nicotine dependence: Secondary | ICD-10-CM

## 2014-08-01 DIAGNOSIS — I255 Ischemic cardiomyopathy: Secondary | ICD-10-CM

## 2014-08-01 DIAGNOSIS — Z1231 Encounter for screening mammogram for malignant neoplasm of breast: Secondary | ICD-10-CM

## 2014-08-01 LAB — POCT URINALYSIS DIPSTICK
Bilirubin, UA: NEGATIVE
Blood, UA: NEGATIVE
Glucose, UA: NEGATIVE
KETONES UA: NEGATIVE
Leukocytes, UA: NEGATIVE
NITRITE UA: NEGATIVE
PH UA: 5
PROTEIN UA: NEGATIVE
SPEC GRAV UA: 1.015
Urobilinogen, UA: 0.2

## 2014-08-01 NOTE — Telephone Encounter (Signed)
Called pt, left detailed message advising to come in.

## 2014-08-01 NOTE — Progress Notes (Signed)
Subjective:    Patient ID: Susan Welch, female    DOB: August 05, 1945, 69 y.o.   MRN: [id number]  08/01/2014  Knee Pain   HPI This 69 y.o. female presents for pre-operative clearance for LEFT knee replacement surgery by Dr. Percell Miller.  Surgery is scheduled pending; waiting for PCP.  Two months from now.   With previous R TKR, she was admitted for total knee replacement and had a V. Fib arrest and was found to have three-vessel disease. She has a mildly reduced ejection fraction.   CAD/CABG: +fatigued but working 60 hours per week.  Heartburn frequently prior to CABG.   Echo 07-05-2014 WNL. No stress testing.  [place] for surgery by cardiology.    DVT RLE: developed after R TKR; maintained Coumadin for six months.     Last physical:  2014 Pap smear:  2014 Mammogram:  05-04-2012 Colonoscopy:  Never; refused Bone density:  Twice in past; 20 years initial; 5 years ago; osteopenia. TDAP:  2010 Pneumovax:  2014;  Zostavax:  never Influenza:  2015    Review of Systems  Constitutional: Negative for fever, chills, diaphoresis and fatigue.  Eyes: Negative for visual disturbance.  Respiratory: Negative for cough and shortness of breath.        Snoring  Cardiovascular: Negative for chest pain, palpitations and leg swelling.  Gastrointestinal: Negative for nausea, vomiting, abdominal pain, diarrhea and constipation.  Endocrine: Negative for cold intolerance, heat intolerance, polydipsia, polyphagia and polyuria.  Musculoskeletal: Positive for arthralgias and gait problem.  Neurological: Negative for dizziness, tremors, seizures, syncope, facial asymmetry, speech difficulty, weakness, light-headedness, numbness and headaches.  Psychiatric/Behavioral: Negative for suicidal ideas, self-injury and dysphoric mood. The patient is not nervous/anxious.     Past Medical History  Diagnosis Date  . Anemia   . Hypertension   . Anxiety     Hx: of situational anxiety  . Headache(784.0)     Hx: of  migraines  . Hyperlipidemia   . Panic attacks   . Ventricular fibrillation     a. 07/15/2012 in setting of R TKA  . Ischemic cardiomyopathy     Echo 07/19/12: EF 40-45%, basal to mid anteroseptal AK, distal anteroseptal HK, mild MR.   . Coronary artery disease     a. s/p VF arrest during TKR 7/14 2/2 NSTEMI => LHC with 3v CAD =>  s/p CABG 07/21/12 (LIMA-LAD, SVG-RI and OM1, SVG-distal RCA).   . Carotid stenosis     Pre-CABG Dopplers 07/19/12: Right 40-59%.  . DVT (deep venous thrombosis) 07/2012  . Arthritis     a. bilat knees s/p R TKA 07/15/2012   Past Surgical History  Procedure Laterality Date  . Abdominal hysterectomy    . Dilation and curettage of uterus    . Total knee arthroplasty Right 07/15/2012    Procedure: TOTAL KNEE ARTHROPLASTY;  Surgeon: [name], MD;  Location: [facility];  Service: Orthopedics;  Laterality: Right;  drapes pulled back to provide cpr, new drape applied, protocol followed  . Coronary artery bypass graft N/A 07/21/2012    Procedure: CORONARY ARTERY BYPASS GRAFTING (CABG);  Surgeon: Grace Isaac, MD;  Location: [facility];  Service: Open Heart Surgery;  Laterality: N/A;  Times 4 using left internal mammary artery and endoscopically harvested left saphenous vein.  . Intraoperative transesophageal echocardiogram N/A 07/21/2012    Procedure: INTRAOPERATIVE TRANSESOPHAGEAL ECHOCARDIOGRAM;  Surgeon: Grace Isaac, MD;  Location: [facility];  Service: Open Heart Surgery;  Laterality: N/A;  . Left heart catheterization with  coronary angiogram N/A 07/17/2012    Procedure: LEFT HEART CATHETERIZATION WITH CORONARY ANGIOGRAM;  Surgeon: Minus Breeding, MD;  Location: [facility] CATH LAB;  Service: Cardiovascular;  Laterality: N/A;   Allergies  Allergen Reactions  . Statins Other (See Comments)    Joint pain with Lipitor daily, Pravastatin 10 mg qd and Pravastatin 10 mg M/W/F, Crestor 10 mg once weekly also caused joint aches  . Nickel Rash   Current Outpatient Prescriptions    Medication Sig Dispense Refill  . acetaminophen (TYLENOL) 500 MG tablet Take 1,000 mg by mouth daily as needed for pain.    Marland Kitchen aspirin 81 MG EC tablet Take 1 tablet (81 mg total) by mouth daily.  0  . calcium citrate-vitamin D (CITRACAL+D) 315-200 MG-UNIT per tablet Take 1 tablet by mouth daily.    . Cholecalciferol (VITAMIN D) 2000 UNITS tablet Take 1 tablet (2,000 Units total) by mouth daily. 30 tablet 0  . Coenzyme Q10 (CO Q10) 200 MG CAPS Take 200 mg by mouth daily. 30 capsule 0  . ezetimibe (ZETIA) 10 MG tablet Take 1 tablet (10 mg total) by mouth daily. 30 tablet 6  . folic acid (CVS FOLIC ACID) 025 MCG tablet Take 400 mcg by mouth daily.    Marland Kitchen lisinopril (PRINIVIL,ZESTRIL) 5 MG tablet Take 1 tablet (5 mg total) by mouth 2 (two) times daily. 60 tablet 11  . metoprolol (LOPRESSOR) 50 MG tablet Take 1 tablet (50 mg total) by mouth 2 (two) times daily. 60 tablet 11   No current facility-administered medications for this visit.   History   Social History  . Marital Status: Widowed    Spouse Name: N/A  . Number of Children: N/A  . Years of Education: N/A   Occupational History  . admin assistant    Social History Main Topics  . Smoking status: Former Smoker    Quit date: 01/08/2000  . Smokeless tobacco: Not on file  . Alcohol Use: Yes     Comment: "occasional wine"  . Drug Use: No  . Sexual Activity: Not Currently    Birth Control/ Protection: Post-menopausal   Other Topics Concern  . Not on file   Social History Narrative   Marital status: Divorced and widowed.      Children: 2 children; 6 grandchildren.      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       Alcohol: none       Exercise: every other day; waling 2 miles; OA limiting activity.      ADLs: independent with ADLs.      Family History  Problem Relation Age of Onset  . Thyroid disease Mother     has thyroid  removed  . Colitis Mother   . COPD Sister        Objective:    BP 130/90 mmHg  Pulse 72  Temp(Src) 98.3 F (36.8 C) (Oral)  Resp 18  Ht 5' 2.5" (1.588 m)  Wt 169 lb 12.8 oz (77.021 kg)  BMI 30.54 kg/m2  SpO2 96% Physical Exam  Constitutional: She is oriented to person, place, and time. She appears well-developed and well-nourished. No distress.  HENT:  Head: Normocephalic and atraumatic.  Right Ear: External ear normal.  Left Ear: External ear normal.  Nose: Nose normal.  Mouth/Throat: Oropharynx is clear and moist.  Eyes: Conjunctivae and EOM are  normal. Pupils are equal, round, and reactive to light.  Neck: Normal range of motion. Neck supple. Carotid bruit is not present. No thyromegaly present.  Cardiovascular: Normal rate, regular rhythm, normal heart sounds and intact distal pulses.  Exam reveals no gallop and no friction rub.   No murmur heard. Well healed midline scar.  Pulmonary/Chest: Effort normal and breath sounds normal. She has no wheezes. She has no rales.  Abdominal: Soft. Bowel sounds are normal. She exhibits no distension and no mass. There is no tenderness. There is no rebound and no guarding.  Musculoskeletal:       Lumbar back: Normal.  Lymphadenopathy:    She has no cervical adenopathy.  Neurological: She is alert and oriented to person, place, and time. No cranial nerve deficit.  Skin: Skin is warm and dry. No rash noted. She is not diaphoretic. No erythema. No pallor.  Psychiatric: She has a normal mood and affect. Her behavior is normal.   Results for orders placed or performed in visit on 08/01/14  POCT urinalysis dipstick  Result Value Ref Range   Color, UA yellow    Clarity, UA clear    Glucose, UA neg    Bilirubin, UA neg    Ketones, UA neg    Spec Grav, UA 1.015    Blood, UA neg    pH, UA 5.0    Protein, UA neg    Urobilinogen, UA 0.2    Nitrite, UA neg    Leukocytes, UA Negative Negative   PREVNAR-13 ADMINISTERED.  [facility] reading  (PRIMARY) by  Dr. Tamala Julian. CXR: NAD      Assessment & Plan:   Pre-operative general physical examination - Plan: POCT urinalysis dipstick, CBC with Differential/Platelet, Comprehensive metabolic panel, DG Chest 2 View  History of tobacco abuse - Plan: DG Chest 2 View  Ischemic cardiomyopathy - Plan: POCT urinalysis dipstick, CBC with Differential/Platelet, Comprehensive metabolic panel, DG Chest 2 View  Coronary artery disease involving autologous vein coronary bypass graft without angina pectoris - Plan: POCT urinalysis dipstick, CBC with Differential/Platelet, Comprehensive metabolic panel, DG Chest 2 View  Primary osteoarthritis of left knee  Need for prophylactic vaccination against Streptococcus pneumoniae (pneumococcus) - Plan: Pneumococcal conjugate vaccine 13-valent IM  Encounter for screening mammogram for breast cancer - Plan: MM Digital Screening  Snoring  BMI 30.0-30.9,adult  Obesity  1. Pre-operative clearance for L TKR:  Clinically stable; obtain labs, CXR.  S/p cardiac clearance by cardiology; recent visit last month.  Asymptomatic. 2.  History of tobacco abuse: obtain CXR for pre-operative clearance. 3.  Ischemic cardiomyopathy: stable; asymptomatic; s/p recent cardiology follow-up; clearance from cardiology. 4. CAD s/p CABG: stable; followed by cardiology. 5.  L OA knee:  Worsening; to undergo L TKR. 6.  S/p Prevnar 13 in preparation for surgery. 7. Breast cancer screening: refer for mammogram. 8.  Snoring: New.  With obesity,CAD and ischemic cardiomyopathy; refer for sleep study in upcoming months. 9. Obesity/BMI 30: recommend weight loss, exercise.     No orders of the defined types were placed in this encounter.    No Follow-up on file.    Destine Zirkle Elayne Guerin, M.D. [facility] [address] [place], [place]  [address] [phone] phone [phone] fax

## 2014-08-02 LAB — COMPREHENSIVE METABOLIC PANEL
ALT: 34 U/L — ABNORMAL HIGH (ref 6–29)
AST: 31 U/L (ref 10–35)
Albumin: 4.6 g/dL (ref 3.6–5.1)
Alkaline Phosphatase: 65 U/L (ref 33–130)
BUN: 22 mg/dL (ref 7–25)
CO2: 28 meq/L (ref 20–31)
CREATININE: 0.86 mg/dL (ref 0.50–0.99)
Calcium: 10.1 mg/dL (ref 8.6–10.4)
Chloride: 104 mEq/L (ref 98–110)
GLUCOSE: 97 mg/dL (ref 65–99)
Potassium: 4.8 mEq/L (ref 3.5–5.3)
Sodium: 141 mEq/L (ref 135–146)
Total Bilirubin: 0.3 mg/dL (ref 0.2–1.2)
Total Protein: 7.7 g/dL (ref 6.1–8.1)

## 2014-08-02 LAB — CBC WITH DIFFERENTIAL/PLATELET
BASOS ABS: 0.1 10*3/uL (ref 0.0–0.1)
BASOS PCT: 1 % (ref 0–1)
EOS PCT: 6 % — AB (ref 0–5)
Eosinophils Absolute: 0.5 10*3/uL (ref 0.0–0.7)
HCT: 43.5 % (ref 36.0–46.0)
Hemoglobin: 14.3 g/dL (ref 12.0–15.0)
LYMPHS ABS: 2.7 10*3/uL (ref 0.7–4.0)
Lymphocytes Relative: 31 % (ref 12–46)
MCH: 28.5 pg (ref 26.0–34.0)
MCHC: 32.9 g/dL (ref 30.0–36.0)
MCV: 86.7 fL (ref 78.0–100.0)
MONOS PCT: 10 % (ref 3–12)
MPV: 10.6 fL (ref 8.6–12.4)
Monocytes Absolute: 0.9 10*3/uL (ref 0.1–1.0)
NEUTROS ABS: 4.5 10*3/uL (ref 1.7–7.7)
NEUTROS PCT: 52 % (ref 43–77)
Platelets: 355 10*3/uL (ref 150–400)
RBC: 5.02 MIL/uL (ref 3.87–5.11)
RDW: 14.3 % (ref 11.5–15.5)
WBC: 8.7 10*3/uL (ref 4.0–10.5)

## 2014-08-03 ENCOUNTER — Other Ambulatory Visit: Payer: Self-pay

## 2014-08-03 DIAGNOSIS — Z1231 Encounter for screening mammogram for malignant neoplasm of breast: Secondary | ICD-10-CM

## 2014-08-23 ENCOUNTER — Telehealth: Payer: Self-pay | Admitting: *Deleted

## 2014-08-25 NOTE — Telephone Encounter (Signed)
Spoke with subject about upcoming knee surgery. She will contact the [facility] to let us know if she will give her injection, PCSK9 Inhibitor injection at [facility] during her rehab. We will reschedule her visit.   By Eulah Pont, RN

## 2014-09-06 ENCOUNTER — Other Ambulatory Visit: Payer: Self-pay | Admitting: Physician Assistant

## 2014-09-06 NOTE — H&P (Signed)
TOTAL KNEE ADMISSION H&P  Patient is being admitted for left total knee arthroplasty.  Subjective:  Chief Complaint:left knee pain.  HPI: Susan Welch, 69 y.o. female, has a history of pain and functional disability in the left knee due to arthritis and has failed non-surgical conservative treatments for greater than 12 weeks to includeNSAID's and/or analgesics.  Onset of symptoms was gradual, starting 5 years ago with gradually worsening course since that time. The patient noted no past surgery on the left knee(s).  Patient currently rates pain in the left knee(s) at 5 out of 10 with activity. Patient has night pain.  Patient has evidence of subchondral sclerosis and joint space narrowing by imaging studies. There is no active infection.  Patient Active Problem List   Diagnosis Date Noted  . Ischemic cardiomyopathy   . S/P CABG x 4 08/20/2012  . Knee joint replacement by other means 07/26/2012  . Osteoarthritis of right knee 07/16/2012  . HTN (hypertension) 09/20/2011  . Lipid disorder 09/20/2011   Past Medical History  Diagnosis Date  . Anemia   . Hypertension   . Anxiety     Hx: of situational anxiety  . Headache(784.0)     Hx: of migraines  . Hyperlipidemia   . Panic attacks   . Ventricular fibrillation     a. 07/15/2012 in setting of R TKA  . Ischemic cardiomyopathy     Echo 07/19/12: EF 40-45%, basal to mid anteroseptal AK, distal anteroseptal HK, mild MR.   . Coronary artery disease     a. s/p VF arrest during TKR 7/14 2/2 NSTEMI => LHC with 3v CAD =>  s/p CABG 07/21/12 (LIMA-LAD, SVG-RI and OM1, SVG-distal RCA).   . Carotid stenosis     Pre-CABG Dopplers 07/19/12: Right 40-59%.  . DVT (deep venous thrombosis) 07/2012  . Arthritis     a. bilat knees s/p R TKA 07/15/2012    Past Surgical History  Procedure Laterality Date  . Abdominal hysterectomy    . Dilation and curettage of uterus    . Total knee arthroplasty Right 07/15/2012    Procedure: TOTAL KNEE ARTHROPLASTY;   Surgeon: [name], MD;  Location: [place];  Service: Orthopedics;  Laterality: Right;  drapes pulled back to provide cpr, new drape applied, protocol followed  . Coronary artery bypass graft N/A 07/21/2012    Procedure: CORONARY ARTERY BYPASS GRAFTING (CABG);  Surgeon: Grace Isaac, MD;  Location: [place];  Service: Open Heart Surgery;  Laterality: N/A;  Times 4 using left internal mammary artery and endoscopically harvested left saphenous vein.  . Intraoperative transesophageal echocardiogram N/A 07/21/2012    Procedure: INTRAOPERATIVE TRANSESOPHAGEAL ECHOCARDIOGRAM;  Surgeon: Grace Isaac, MD;  Location: [place];  Service: Open Heart Surgery;  Laterality: N/A;  . Left heart catheterization with coronary angiogram N/A 07/17/2012    Procedure: LEFT HEART CATHETERIZATION WITH CORONARY ANGIOGRAM;  Surgeon: Minus Breeding, MD;  Location: [facility] CATH LAB;  Service: Cardiovascular;  Laterality: N/A;     (Not in a hospital admission) Allergies  Allergen Reactions  . Statins Other (See Comments)    Joint pain with Lipitor daily, Pravastatin 10 mg qd and Pravastatin 10 mg M/W/F, Crestor 10 mg once weekly also caused joint aches  . Nickel Rash    Social History  Substance Use Topics  . Smoking status: Former Smoker    Quit date: 01/08/2000  . Smokeless tobacco: Not on file  . Alcohol Use: Yes     Comment: "occasional wine"  Family History  Problem Relation Age of Onset  . Thyroid disease Mother     has thyroid removed  . Colitis Mother   . COPD Sister      Review of Systems  Constitutional: Negative.   HENT: Negative.   Eyes: Negative.   Respiratory: Negative.   Cardiovascular: Negative.   Gastrointestinal: Negative.   Genitourinary: Negative.   Musculoskeletal: Positive for joint pain.  Skin: Negative.   Neurological: Negative.   Endo/Heme/Allergies: Negative.   Psychiatric/Behavioral: Negative.     Objective:  Physical Exam  Constitutional: She is oriented to person,  place, and time. She appears well-developed and well-nourished.  HENT:  Head: Normocephalic and atraumatic.  Eyes: EOM are normal. Pupils are equal, round, and reactive to light.  Neck: Normal range of motion. Neck supple.  Cardiovascular: Normal rate and regular rhythm.   Respiratory: Effort normal and breath sounds normal. No respiratory distress. She has no wheezes. She has no rales.  GI: Soft. Bowel sounds are normal.  Musculoskeletal:  Examination of her left knee reveals a correctable varus deformity.  Range of motion 0-125 degrees.  Marked patellofemoral crepitus.  Marked medial joint line tenderness.  She is neurovascularly intact distally  Neurological: She is alert and oriented to person, place, and time.  Skin: Skin is warm and dry.  Psychiatric: She has a normal mood and affect. Her behavior is normal. Judgment and thought content normal.    Vital signs in last 24 hours: @VSRANGES @  Labs:   Estimated body mass index is 30.54 kg/(m^2) as calculated from the following:   Height as of 08/01/14: 5' 2.5" (1.588 m).   Weight as of 08/01/14: 77.021 kg (169 lb 12.8 oz).   Imaging Review Plain radiographs demonstrate severe degenerative joint disease of the left knee(s). The overall alignment ismild varus. The bone quality appears to be fair for age and reported activity level.  Assessment/Plan:  End stage arthritis, left knee   The patient history, physical examination, clinical judgment of the provider and imaging studies are consistent with end stage degenerative joint disease of the left knee(s) and total knee arthroplasty is deemed medically necessary. The treatment options including medical management, injection therapy arthroscopy and arthroplasty were discussed at length. The risks and benefits of total knee arthroplasty were presented and reviewed. The risks due to aseptic loosening, infection, stiffness, patella tracking problems, thromboembolic complications and other  imponderables were discussed. The patient acknowledged the explanation, agreed to proceed with the plan and consent was signed. Patient is being admitted for inpatient treatment for surgery, pain control, PT, OT, prophylactic antibiotics, VTE prophylaxis, progressive ambulation and ADL's and discharge planning. The patient is planning to be discharged to skilled nursing facility

## 2014-09-07 NOTE — Pre-Procedure Instructions (Signed)
Shawndra Clute  09/07/2014      [id number] [place], [place] - [address] [place] [address] Phone: [phone] Fax: [phone]  [id number] - [place], [place] AT [place] [address] Phone: [phone] Fax: [phone]  [place], [address] [place] [address] Phone: [phone] Fax: [phone]    Your procedure is scheduled on Wednesday, September 21, 2014  Report to [facility] Admitting at 11:45 A.M.  Call this number if you have problems the morning of surgery:  [phone]   Remember:  Do not eat food or drink liquids after midnight Tuesday, September 20, 2014  Take these medicines the morning of surgery with A SIP OF WATER :metoprolol (LOPRESSOR), if needed:acetaminophen (TYLENOL) for pain  Stop taking Aspirin, vitamins and herbal medications such as Coenzyme Q10 (CO Q10) . Do not take any NSAIDs ie: Ibuprofen, Advil, Naproxen or any medication containing Aspirin; stop 1 week prior to procedure ( Wednesday, September 14, 2014)   Do not wear jewelry, make-up or nail polish.  Do not wear lotions, powders, or perfumes.  You may not wear deodorant.  Do not shave 48 hours prior to surgery.    Do not bring valuables to the hospital.   [facility] is not responsible for any belongings or valuables.  Contacts, dentures or bridgework may not be worn into surgery.  Leave your suitcase in the car.  After surgery it may be brought to your room.  For patients admitted to the hospital, discharge time will be determined by your treatment team.  Patients discharged the day of surgery will not be allowed to drive home.   Name and phone number of your driver:    Special instructions: [place] - Preparing for Surgery  Before surgery, you can play an important role.  Because skin  is not sterile, your skin needs to be as free of germs as possible.  You can reduce the number of germs on you skin by washing with CHG (chlorahexidine gluconate) soap before surgery.  CHG is an antiseptic cleaner which kills germs and bonds with the skin to continue killing germs even after washing.  Please DO NOT use if you have an allergy to CHG or antibacterial soaps.  If your skin becomes reddened/irritated stop using the CHG and inform your nurse when you arrive at Short Stay.  Do not shave (including legs and underarms) for at least 48 hours prior to the first CHG shower.  You may shave your face.  Please follow these instructions carefully:   1.  Shower with CHG Soap the night before surgery and the morning of Surgery.  2.  If you choose to wash your hair, wash your hair first as usual with your normal shampoo.  3.  After you shampoo, rinse your hair and body thoroughly to remove the Shampoo.  4.  Use CHG as you would any other liquid soap.  You can apply chg directly  to the skin and wash gently with scrungie or a clean washcloth.  5.  Apply the CHG Soap to your body ONLY FROM THE NECK DOWN.  Do not use on open wounds or open sores.  Avoid contact with your eyes, ears, mouth and genitals (private parts).  Wash genitals (private parts) with your normal soap.  6.  Wash thoroughly, paying special attention to  the area where your surgery will be performed.  7.  Thoroughly rinse your body with warm water from the neck down.  8.  DO NOT shower/wash with your normal soap after using and rinsing off the CHG Soap.  9.  Pat yourself dry with a clean towel.            10.  Wear clean pajamas.            11.  Place clean sheets on your bed the night of your first shower and do not sleep with pets.  Day of Surgery  Do not apply any lotions/deodorants the morning of surgery.  Please wear clean clothes to the hospital/surgery center.  Please read over the following fact sheets that you were  given. Pain Booklet, Coughing and Deep Breathing, Blood Transfusion Information, Total Joint Packet, MRSA Information and Surgical Site Infection Prevention

## 2014-09-08 ENCOUNTER — Encounter (HOSPITAL_COMMUNITY)
Admission: RE | Admit: 2014-09-08 | Discharge: 2014-09-08 | Disposition: A | Discharge status: Home / Self Care | Payer: Medicare Other | Source: Ambulatory Visit | Attending: Orthopedic Surgery | Admitting: Orthopedic Surgery

## 2014-09-08 ENCOUNTER — Encounter (HOSPITAL_COMMUNITY): Payer: Self-pay

## 2014-09-08 DIAGNOSIS — Z951 Presence of aortocoronary bypass graft: Secondary | ICD-10-CM | POA: Diagnosis not present

## 2014-09-08 DIAGNOSIS — Z01812 Encounter for preprocedural laboratory examination: Secondary | ICD-10-CM | POA: Diagnosis not present

## 2014-09-08 DIAGNOSIS — Z86718 Personal history of other venous thrombosis and embolism: Secondary | ICD-10-CM | POA: Diagnosis not present

## 2014-09-08 DIAGNOSIS — I255 Ischemic cardiomyopathy: Secondary | ICD-10-CM | POA: Diagnosis not present

## 2014-09-08 DIAGNOSIS — E785 Hyperlipidemia, unspecified: Secondary | ICD-10-CM | POA: Diagnosis not present

## 2014-09-08 DIAGNOSIS — Z96651 Presence of right artificial knee joint: Secondary | ICD-10-CM | POA: Insufficient documentation

## 2014-09-08 DIAGNOSIS — I252 Old myocardial infarction: Secondary | ICD-10-CM | POA: Diagnosis not present

## 2014-09-08 DIAGNOSIS — Z01818 Encounter for other preprocedural examination: Secondary | ICD-10-CM | POA: Insufficient documentation

## 2014-09-08 DIAGNOSIS — M1712 Unilateral primary osteoarthritis, left knee: Secondary | ICD-10-CM | POA: Diagnosis not present

## 2014-09-08 DIAGNOSIS — Z87891 Personal history of nicotine dependence: Secondary | ICD-10-CM | POA: Diagnosis not present

## 2014-09-08 DIAGNOSIS — Z7982 Long term (current) use of aspirin: Secondary | ICD-10-CM | POA: Diagnosis not present

## 2014-09-08 DIAGNOSIS — Z0183 Encounter for blood typing: Secondary | ICD-10-CM | POA: Diagnosis not present

## 2014-09-08 DIAGNOSIS — I10 Essential (primary) hypertension: Secondary | ICD-10-CM | POA: Insufficient documentation

## 2014-09-08 DIAGNOSIS — Z79899 Other long term (current) drug therapy: Secondary | ICD-10-CM | POA: Insufficient documentation

## 2014-09-08 DIAGNOSIS — I2510 Atherosclerotic heart disease of native coronary artery without angina pectoris: Secondary | ICD-10-CM | POA: Diagnosis not present

## 2014-09-08 HISTORY — DX: Presence of spectacles and contact lenses: Z97.3

## 2014-09-08 HISTORY — DX: Gastro-esophageal reflux disease without esophagitis: K21.9

## 2014-09-08 LAB — COMPREHENSIVE METABOLIC PANEL
ALT: 33 U/L (ref 14–54)
ANION GAP: 8 (ref 5–15)
AST: 29 U/L (ref 15–41)
Albumin: 4.3 g/dL (ref 3.5–5.0)
Alkaline Phosphatase: 61 U/L (ref 38–126)
BILIRUBIN TOTAL: 0.5 mg/dL (ref 0.3–1.2)
BUN: 26 mg/dL — ABNORMAL HIGH (ref 6–20)
CO2: 24 mmol/L (ref 22–32)
Calcium: 9.6 mg/dL (ref 8.9–10.3)
Chloride: 106 mmol/L (ref 101–111)
Creatinine, Ser: 0.85 mg/dL (ref 0.44–1.00)
Glucose, Bld: 87 mg/dL (ref 65–99)
POTASSIUM: 4.3 mmol/L (ref 3.5–5.1)
Sodium: 138 mmol/L (ref 135–145)
TOTAL PROTEIN: 7.2 g/dL (ref 6.5–8.1)

## 2014-09-08 LAB — TYPE AND SCREEN
ABO/RH(D): A POS
ANTIBODY SCREEN: NEGATIVE

## 2014-09-08 LAB — PROTIME-INR
INR: 1.1 (ref 0.00–1.49)
PROTHROMBIN TIME: 14.4 s (ref 11.6–15.2)

## 2014-09-08 LAB — SURGICAL PCR SCREEN
MRSA, PCR: NEGATIVE
Staphylococcus aureus: NEGATIVE

## 2014-09-08 LAB — CBC WITH DIFFERENTIAL/PLATELET
Basophils Absolute: 0.1 10*3/uL (ref 0.0–0.1)
Basophils Relative: 1 % (ref 0–1)
EOS PCT: 5 % (ref 0–5)
Eosinophils Absolute: 0.4 10*3/uL (ref 0.0–0.7)
HEMATOCRIT: 42.9 % (ref 36.0–46.0)
Hemoglobin: 14.2 g/dL (ref 12.0–15.0)
LYMPHS PCT: 34 % (ref 12–46)
Lymphs Abs: 2.7 10*3/uL (ref 0.7–4.0)
MCH: 28.9 pg (ref 26.0–34.0)
MCHC: 33.1 g/dL (ref 30.0–36.0)
MCV: 87.4 fL (ref 78.0–100.0)
MONO ABS: 0.5 10*3/uL (ref 0.1–1.0)
MONOS PCT: 6 % (ref 3–12)
NEUTROS ABS: 4.4 10*3/uL (ref 1.7–7.7)
Neutrophils Relative %: 54 % (ref 43–77)
Platelets: 311 10*3/uL (ref 150–400)
RBC: 4.91 MIL/uL (ref 3.87–5.11)
RDW: 13.7 % (ref 11.5–15.5)
WBC: 8.1 10*3/uL (ref 4.0–10.5)

## 2014-09-08 LAB — APTT: aPTT: 30 seconds (ref 24–37)

## 2014-09-09 LAB — URINE CULTURE: Culture: NO GROWTH

## 2014-09-09 NOTE — Progress Notes (Signed)
Anesthesia Chart Review:  Pt is 70 year old female scheduled for L total knee arthroplasty on 09/21/2014 with Dr. Maryla Morrow.   Cardiologist is Dr. Percival Spanish.   PMH includes: CAD (s/p VF arrest during TKR 7/14 due to NSTEMI => LHC with 3v CAD => s/p CABG 07/21/12 (LIMA-LAD, SVG-RI and OM1, SVG-distal RCA)), ischemic cardiomyopathy, HTN, carotid stenosis, hyperlipidemia, anemia, DVT. Former smoker. BMI 31. S/p CABG 07/21/12. S/p R TKA 07/15/12.   Medications include: ASA, zetia, lisinopril, metoprolol.   Preoperative labs reviewed.    Chest x-ray 08/01/2014 reviewed. No active cardiopulmonary disease.   EKG 12/22/2014: NSR. Cannot rule out inferior infarct, age undetermined. No change from prior EKG per Dr. Percival Spanish.   Echo 07/05/2014:  - Left ventricle: The cavity size was normal. Wall thickness was normal. Systolic function was normal. The estimated ejection fraction was in the range of 50% to 55%. Mid to distal anteroseptal hypokinesis. Doppler parameters are consistent with abnormal left ventricular relaxation (grade 1 diastolic dysfunction). The E/e&' ratio is between 8-15, suggesting indeterminate LV filling pressure. - Aortic valve: Trileaflet. Sclerosis without stenosis. There was mild regurgitation. - Mitral valve: Calcified annulus. There was mild regurgitation. - Left atrium: The atrium was normal in size. -Impressions:Compared to the prior echo in 07/2012, the EF has improved to 50-55%.  Carotid duplex US 08/02/2013:  -B ICA demonstrated a mild amount of fibrous plaque with no evidence of significant diameter reduction.   Cardiac cath 07/17/2012:  -Left mainstem: Short -Left anterior descending (LAD): Proximal/ostial focal 90% stenosis. Mid 70% at the take of of the D1. D1 is moderate sized and branching with ostial 80% stenosis and mid 70% at the bifurcation.  -Left circumflex (LCx): RI is very large with long 90% stenosis. AV groove is calcified with long proximal 30%  stenosis, 50% stenosis at the take off of a large MOM. The MOM is otherwise free of high grade stenosis.  -Right coronary artery (RCA): Proximal 100% chronic occlusion. The distal vessel fills via collaterals from the LAD and circ. The distal vessel is large and appears to be free of high grade disease.  -Left ventriculography: Left ventricular systolic function is mildly reduced with inferior akinesis, LVEF is estimated at 45%, there is no significant mitral regurgitation  -Final Conclusions: Severe 3 vessel CAD with mildly reduced EF.  -Recommendations: Plan CABG.   Pt has cardiac clearance from Dr. Percival Spanish in [place] note dated 06/23/2014.   If no changes, I anticipate pt can proceed with surgery as scheduled.   Willeen Cass, FNP-BC [facility] Short Stay Surgical [facility]/Anesthesiology Phone: [phone] 09/09/2014 4:21 PM

## 2014-09-15 ENCOUNTER — Ambulatory Visit
Admission: RE | Admit: 2014-09-15 | Discharge: 2014-09-15 | Disposition: A | Discharge status: Home / Self Care | Payer: Medicare Other | Source: Ambulatory Visit | Attending: Family Medicine | Admitting: Family Medicine

## 2014-09-15 DIAGNOSIS — Z1231 Encounter for screening mammogram for malignant neoplasm of breast: Secondary | ICD-10-CM

## 2014-09-20 NOTE — Progress Notes (Signed)
Message left on patients voicemail and also talked to pts son Mia [place] and informed them of surgical time change and to arrive at 0630 tomorrow.

## 2014-09-21 ENCOUNTER — Inpatient Hospital Stay (HOSPITAL_COMMUNITY): Payer: Medicare Other

## 2014-09-21 ENCOUNTER — Inpatient Hospital Stay (HOSPITAL_COMMUNITY): Payer: Medicare Other | Admitting: Emergency Medicine

## 2014-09-21 ENCOUNTER — Encounter (HOSPITAL_COMMUNITY): Payer: Self-pay | Admitting: *Deleted

## 2014-09-21 ENCOUNTER — Encounter (HOSPITAL_COMMUNITY)
Admission: RE | Disposition: A | Discharge status: Skilled Nursing Facility | Payer: Self-pay | Source: Ambulatory Visit | Attending: Orthopedic Surgery

## 2014-09-21 ENCOUNTER — Inpatient Hospital Stay (HOSPITAL_COMMUNITY)
Admission: RE | Admit: 2014-09-21 | Discharge: 2014-09-23 | DRG: 470 | Disposition: A | Discharge status: Skilled Nursing Facility | Payer: Medicare Other | Source: Ambulatory Visit | Attending: Orthopedic Surgery | Admitting: Orthopedic Surgery

## 2014-09-21 ENCOUNTER — Inpatient Hospital Stay (HOSPITAL_COMMUNITY): Payer: Medicare Other | Admitting: Anesthesiology

## 2014-09-21 DIAGNOSIS — I255 Ischemic cardiomyopathy: Secondary | ICD-10-CM | POA: Diagnosis present

## 2014-09-21 DIAGNOSIS — K219 Gastro-esophageal reflux disease without esophagitis: Secondary | ICD-10-CM | POA: Diagnosis present

## 2014-09-21 DIAGNOSIS — Z888 Allergy status to other drugs, medicaments and biological substances status: Secondary | ICD-10-CM | POA: Diagnosis not present

## 2014-09-21 DIAGNOSIS — M1710 Unilateral primary osteoarthritis, unspecified knee: Secondary | ICD-10-CM

## 2014-09-21 DIAGNOSIS — I10 Essential (primary) hypertension: Secondary | ICD-10-CM | POA: Diagnosis present

## 2014-09-21 DIAGNOSIS — D62 Acute posthemorrhagic anemia: Secondary | ICD-10-CM | POA: Diagnosis not present

## 2014-09-21 DIAGNOSIS — E785 Hyperlipidemia, unspecified: Secondary | ICD-10-CM | POA: Diagnosis present

## 2014-09-21 DIAGNOSIS — Z87891 Personal history of nicotine dependence: Secondary | ICD-10-CM | POA: Diagnosis not present

## 2014-09-21 DIAGNOSIS — M1712 Unilateral primary osteoarthritis, left knee: Principal | ICD-10-CM | POA: Diagnosis present

## 2014-09-21 DIAGNOSIS — I2510 Atherosclerotic heart disease of native coronary artery without angina pectoris: Secondary | ICD-10-CM | POA: Diagnosis present

## 2014-09-21 DIAGNOSIS — Z96659 Presence of unspecified artificial knee joint: Secondary | ICD-10-CM

## 2014-09-21 DIAGNOSIS — M179 Osteoarthritis of knee, unspecified: Secondary | ICD-10-CM | POA: Diagnosis present

## 2014-09-21 DIAGNOSIS — Z951 Presence of aortocoronary bypass graft: Secondary | ICD-10-CM

## 2014-09-21 HISTORY — DX: Unilateral primary osteoarthritis, unspecified knee: M17.10

## 2014-09-21 HISTORY — DX: Osteoarthritis of knee, unspecified: M17.9

## 2014-09-21 HISTORY — PX: TOTAL KNEE ARTHROPLASTY: SHX125

## 2014-09-21 SURGERY — ARTHROPLASTY, KNEE, TOTAL
Anesthesia: Monitor Anesthesia Care | Laterality: Left

## 2014-09-21 MED ORDER — APIXABAN 2.5 MG PO TABS: ORAL_TABLET | ORAL | Status: DC

## 2014-09-21 MED ORDER — DEXTROSE 5 % IV SOLN
500.0000 mg | Freq: Four times a day (QID) | INTRAVENOUS | Status: DC | PRN
  Filled 2014-09-21: qty 5

## 2014-09-21 MED ORDER — ONDANSETRON HCL 4 MG/2ML IJ SOLN
INTRAMUSCULAR | Status: AC
  Filled 2014-09-21: qty 2

## 2014-09-21 MED ORDER — METOCLOPRAMIDE HCL 5 MG PO TABS: 5.0000 mg | ORAL_TABLET | Freq: Three times a day (TID) | ORAL | Status: DC | PRN

## 2014-09-21 MED ORDER — GLYCOPYRROLATE 0.2 MG/ML IJ SOLN
INTRAMUSCULAR | Status: AC
  Filled 2014-09-21: qty 1

## 2014-09-21 MED ORDER — ROCURONIUM BROMIDE 50 MG/5ML IV SOLN
INTRAVENOUS | Status: AC
  Filled 2014-09-21: qty 1

## 2014-09-21 MED ORDER — METOCLOPRAMIDE HCL 5 MG/ML IJ SOLN: 5.0000 mg | Freq: Three times a day (TID) | INTRAMUSCULAR | Status: DC | PRN

## 2014-09-21 MED ORDER — SODIUM CHLORIDE 0.9 % IR SOLN
Status: DC | PRN
  Administered 2014-09-21 (×2): 1000 mL

## 2014-09-21 MED ORDER — OXYCODONE HCL 5 MG PO TABS
ORAL_TABLET | ORAL | Status: AC
  Filled 2014-09-21: qty 2

## 2014-09-21 MED ORDER — BISACODYL 5 MG PO TBEC: 5.0000 mg | DELAYED_RELEASE_TABLET | Freq: Every day | ORAL | Status: DC | PRN

## 2014-09-21 MED ORDER — BUPIVACAINE LIPOSOME 1.3 % IJ SUSP
20.0000 mL | INTRAMUSCULAR | Status: DC
  Filled 2014-09-21: qty 20

## 2014-09-21 MED ORDER — BISACODYL 10 MG RE SUPP: 10.0000 mg | Freq: Every day | RECTAL | Status: DC | PRN

## 2014-09-21 MED ORDER — ACETAMINOPHEN 325 MG PO TABS: 325.0000 mg | ORAL_TABLET | ORAL | Status: DC | PRN

## 2014-09-21 MED ORDER — CELECOXIB 200 MG PO CAPS
200.0000 mg | ORAL_CAPSULE | Freq: Two times a day (BID) | ORAL | Status: DC
  Administered 2014-09-21 – 2014-09-23 (×4): 200 mg via ORAL
  Filled 2014-09-21 (×4): qty 1

## 2014-09-21 MED ORDER — LACTATED RINGERS IV SOLN
INTRAVENOUS | Status: DC
  Administered 2014-09-21 (×2): via INTRAVENOUS

## 2014-09-21 MED ORDER — DEXAMETHASONE SODIUM PHOSPHATE 4 MG/ML IJ SOLN
INTRAMUSCULAR | Status: AC
  Filled 2014-09-21: qty 1

## 2014-09-21 MED ORDER — DIPHENHYDRAMINE HCL 12.5 MG/5ML PO ELIX: 12.5000 mg | ORAL_SOLUTION | ORAL | Status: DC | PRN

## 2014-09-21 MED ORDER — DOCUSATE SODIUM 100 MG PO CAPS
100.0000 mg | ORAL_CAPSULE | Freq: Two times a day (BID) | ORAL | Status: DC
  Administered 2014-09-21 – 2014-09-23 (×5): 100 mg via ORAL
  Filled 2014-09-21 (×5): qty 1

## 2014-09-21 MED ORDER — LIDOCAINE HCL (CARDIAC) 20 MG/ML IV SOLN
INTRAVENOUS | Status: AC
  Filled 2014-09-21: qty 5

## 2014-09-21 MED ORDER — METHOCARBAMOL 500 MG PO TABS
500.0000 mg | ORAL_TABLET | Freq: Four times a day (QID) | ORAL | Status: DC | PRN
  Administered 2014-09-21 – 2014-09-23 (×6): 500 mg via ORAL
  Filled 2014-09-21 (×5): qty 1

## 2014-09-21 MED ORDER — OXYCODONE HCL 5 MG PO TABS
ORAL_TABLET | ORAL | Status: AC
  Filled 2014-09-21: qty 1

## 2014-09-21 MED ORDER — HYDROMORPHONE HCL 1 MG/ML IJ SOLN
0.2500 mg | INTRAMUSCULAR | Status: DC | PRN
  Administered 2014-09-21 (×4): 0.5 mg via INTRAVENOUS

## 2014-09-21 MED ORDER — SODIUM CHLORIDE 0.9 % IJ SOLN
INTRAMUSCULAR | Status: AC
  Filled 2014-09-21: qty 10

## 2014-09-21 MED ORDER — METHOCARBAMOL 500 MG PO TABS
ORAL_TABLET | ORAL | Status: AC
  Filled 2014-09-21: qty 1

## 2014-09-21 MED ORDER — METHOCARBAMOL 500 MG PO TABS: 500.0000 mg | ORAL_TABLET | Freq: Four times a day (QID) | ORAL | Status: DC

## 2014-09-21 MED ORDER — EPHEDRINE SULFATE 50 MG/ML IJ SOLN
INTRAMUSCULAR | Status: AC
  Filled 2014-09-21: qty 1

## 2014-09-21 MED ORDER — APIXABAN 2.5 MG PO TABS
2.5000 mg | ORAL_TABLET | Freq: Two times a day (BID) | ORAL | Status: DC
  Administered 2014-09-22 – 2014-09-23 (×3): 2.5 mg via ORAL
  Filled 2014-09-21 (×3): qty 1

## 2014-09-21 MED ORDER — ONDANSETRON HCL 4 MG PO TABS: 4.0000 mg | ORAL_TABLET | Freq: Three times a day (TID) | ORAL | Status: DC | PRN

## 2014-09-21 MED ORDER — SUCCINYLCHOLINE CHLORIDE 20 MG/ML IJ SOLN
INTRAMUSCULAR | Status: AC
  Filled 2014-09-21: qty 1

## 2014-09-21 MED ORDER — PHENYLEPHRINE 40 MCG/ML (10ML) SYRINGE FOR IV PUSH (FOR BLOOD PRESSURE SUPPORT)
PREFILLED_SYRINGE | INTRAVENOUS | Status: AC
  Filled 2014-09-21: qty 10

## 2014-09-21 MED ORDER — LISINOPRIL 5 MG PO TABS
5.0000 mg | ORAL_TABLET | Freq: Two times a day (BID) | ORAL | Status: DC
  Administered 2014-09-21 – 2014-09-23 (×4): 5 mg via ORAL
  Filled 2014-09-21 (×4): qty 1

## 2014-09-21 MED ORDER — OXYCODONE HCL 5 MG PO TABS
5.0000 mg | ORAL_TABLET | ORAL | Status: DC | PRN
  Administered 2014-09-21 (×3): 10 mg via ORAL
  Administered 2014-09-22: 5 mg via ORAL
  Administered 2014-09-22 (×2): 10 mg via ORAL
  Administered 2014-09-23: 5 mg via ORAL
  Filled 2014-09-21: qty 1
  Filled 2014-09-21 (×2): qty 2
  Filled 2014-09-21: qty 1
  Filled 2014-09-21 (×2): qty 2

## 2014-09-21 MED ORDER — MIDAZOLAM HCL 2 MG/2ML IJ SOLN
INTRAMUSCULAR | Status: AC
  Filled 2014-09-21: qty 4

## 2014-09-21 MED ORDER — DEXAMETHASONE SODIUM PHOSPHATE 10 MG/ML IJ SOLN
10.0000 mg | Freq: Once | INTRAMUSCULAR | Status: AC
  Administered 2014-09-22: 10 mg via INTRAVENOUS
  Filled 2014-09-21: qty 1

## 2014-09-21 MED ORDER — HYDROMORPHONE HCL 1 MG/ML IJ SOLN: 0.5000 mg | INTRAMUSCULAR | Status: DC | PRN

## 2014-09-21 MED ORDER — DEXAMETHASONE SODIUM PHOSPHATE 4 MG/ML IJ SOLN
INTRAMUSCULAR | Status: DC | PRN
  Administered 2014-09-21: 4 mg via INTRAVENOUS

## 2014-09-21 MED ORDER — CEFAZOLIN SODIUM 1-5 GM-% IV SOLN
1.0000 g | Freq: Four times a day (QID) | INTRAVENOUS | Status: AC
  Administered 2014-09-21 (×2): 1 g via INTRAVENOUS
  Filled 2014-09-21 (×2): qty 50

## 2014-09-21 MED ORDER — PROPOFOL 10 MG/ML IV BOLUS
INTRAVENOUS | Status: AC
  Filled 2014-09-21: qty 20

## 2014-09-21 MED ORDER — OXYCODONE HCL 5 MG/5ML PO SOLN: 5.0000 mg | Freq: Once | ORAL | Status: AC | PRN

## 2014-09-21 MED ORDER — POLYETHYLENE GLYCOL 3350 17 G PO PACK: 17.0000 g | PACK | Freq: Every day | ORAL | Status: DC | PRN

## 2014-09-21 MED ORDER — ACETAMINOPHEN 325 MG PO TABS: 650.0000 mg | ORAL_TABLET | Freq: Four times a day (QID) | ORAL | Status: DC | PRN

## 2014-09-21 MED ORDER — PHENOL 1.4 % MT LIQD: 1.0000 | OROMUCOSAL | Status: DC | PRN

## 2014-09-21 MED ORDER — FENTANYL CITRATE (PF) 250 MCG/5ML IJ SOLN
INTRAMUSCULAR | Status: AC
  Filled 2014-09-21: qty 5

## 2014-09-21 MED ORDER — ZOLPIDEM TARTRATE 5 MG PO TABS: 5.0000 mg | ORAL_TABLET | Freq: Every evening | ORAL | Status: DC | PRN

## 2014-09-21 MED ORDER — ONDANSETRON HCL 4 MG PO TABS: 4.0000 mg | ORAL_TABLET | Freq: Four times a day (QID) | ORAL | Status: DC | PRN

## 2014-09-21 MED ORDER — PROPOFOL INFUSION 10 MG/ML OPTIME
INTRAVENOUS | Status: DC | PRN
  Administered 2014-09-21: 75 ug/kg/min via INTRAVENOUS

## 2014-09-21 MED ORDER — PROPOFOL 10 MG/ML IV BOLUS
INTRAVENOUS | Status: DC | PRN
  Administered 2014-09-21 (×2): 20 mg via INTRAVENOUS
  Administered 2014-09-21: 60 mg via INTRAVENOUS
  Administered 2014-09-21: 30 mg via INTRAVENOUS

## 2014-09-21 MED ORDER — FENTANYL CITRATE (PF) 100 MCG/2ML IJ SOLN
INTRAMUSCULAR | Status: DC | PRN
  Administered 2014-09-21 (×3): 50 ug via INTRAVENOUS
  Administered 2014-09-21: 100 ug via INTRAVENOUS
  Administered 2014-09-21 (×2): 50 ug via INTRAVENOUS

## 2014-09-21 MED ORDER — HYDROMORPHONE HCL 1 MG/ML IJ SOLN
INTRAMUSCULAR | Status: AC
  Administered 2014-09-21: 0.5 mg via INTRAVENOUS
  Filled 2014-09-21: qty 1

## 2014-09-21 MED ORDER — ACETAMINOPHEN 160 MG/5ML PO SOLN
325.0000 mg | ORAL | Status: DC | PRN
  Filled 2014-09-21: qty 20.3

## 2014-09-21 MED ORDER — ONDANSETRON HCL 4 MG/2ML IJ SOLN
INTRAMUSCULAR | Status: DC | PRN
  Administered 2014-09-21: 4 mg via INTRAVENOUS

## 2014-09-21 MED ORDER — MAGNESIUM CITRATE PO SOLN: 1.0000 | Freq: Once | ORAL | Status: DC | PRN

## 2014-09-21 MED ORDER — BUPIVACAINE HCL 0.5 % IJ SOLN
INTRAMUSCULAR | Status: DC | PRN
  Administered 2014-09-21: 10 mL

## 2014-09-21 MED ORDER — EZETIMIBE 10 MG PO TABS
10.0000 mg | ORAL_TABLET | Freq: Every day | ORAL | Status: DC
  Administered 2014-09-21 – 2014-09-23 (×3): 10 mg via ORAL
  Filled 2014-09-21 (×3): qty 1

## 2014-09-21 MED ORDER — MENTHOL 3 MG MT LOZG: 1.0000 | LOZENGE | OROMUCOSAL | Status: DC | PRN

## 2014-09-21 MED ORDER — FOLIC ACID 0.5 MG HALF TAB
400.0000 ug | ORAL_TABLET | Freq: Every day | ORAL | Status: DC
  Administered 2014-09-21 – 2014-09-23 (×3): 0.5 mg via ORAL
  Filled 2014-09-21 (×4): qty 1

## 2014-09-21 MED ORDER — CHLORHEXIDINE GLUCONATE 4 % EX LIQD: 60.0000 mL | Freq: Once | CUTANEOUS | Status: DC

## 2014-09-21 MED ORDER — OXYCODONE HCL 5 MG PO TABS
5.0000 mg | ORAL_TABLET | Freq: Once | ORAL | Status: AC | PRN
  Administered 2014-09-21: 5 mg via ORAL

## 2014-09-21 MED ORDER — MIDAZOLAM HCL 5 MG/5ML IJ SOLN
INTRAMUSCULAR | Status: DC | PRN
Start: 2014-09-21 — End: 2014-09-21
  Administered 2014-09-21 (×2): 1 mg via INTRAVENOUS

## 2014-09-21 MED ORDER — ACETAMINOPHEN 650 MG RE SUPP: 650.0000 mg | Freq: Four times a day (QID) | RECTAL | Status: DC | PRN

## 2014-09-21 MED ORDER — METOPROLOL TARTRATE 50 MG PO TABS
50.0000 mg | ORAL_TABLET | Freq: Two times a day (BID) | ORAL | Status: DC
  Administered 2014-09-21 – 2014-09-23 (×4): 50 mg via ORAL
  Filled 2014-09-21 (×4): qty 1

## 2014-09-21 MED ORDER — BUPIVACAINE LIPOSOME 1.3 % IJ SUSP
INTRAMUSCULAR | Status: DC | PRN
  Administered 2014-09-21: 20 mL

## 2014-09-21 MED ORDER — ONDANSETRON HCL 4 MG/2ML IJ SOLN: 4.0000 mg | Freq: Four times a day (QID) | INTRAMUSCULAR | Status: DC | PRN

## 2014-09-21 MED ORDER — BUPIVACAINE IN DEXTROSE 0.75-8.25 % IT SOLN
INTRATHECAL | Status: DC | PRN
  Administered 2014-09-21: 1.6 mL via INTRATHECAL

## 2014-09-21 MED ORDER — POTASSIUM CHLORIDE IN NACL 20-0.9 MEQ/L-% IV SOLN
INTRAVENOUS | Status: DC
  Administered 2014-09-21 – 2014-09-22 (×2): via INTRAVENOUS
  Filled 2014-09-21: qty 1000

## 2014-09-21 MED ORDER — OXYCODONE-ACETAMINOPHEN 5-325 MG PO TABS: 1.0000 | ORAL_TABLET | ORAL | Status: DC | PRN

## 2014-09-21 MED ORDER — SODIUM CHLORIDE 0.9 % IJ SOLN
INTRAMUSCULAR | Status: DC | PRN
  Administered 2014-09-21: 40 mL

## 2014-09-21 MED ORDER — ALUM & MAG HYDROXIDE-SIMETH 200-200-20 MG/5ML PO SUSP
30.0000 mL | ORAL | Status: DC | PRN
  Administered 2014-09-21 – 2014-09-22 (×3): 30 mL via ORAL
  Filled 2014-09-21 (×3): qty 30

## 2014-09-21 MED ORDER — CEFAZOLIN SODIUM-DEXTROSE 2-3 GM-% IV SOLR
2.0000 g | INTRAVENOUS | Status: AC
  Administered 2014-09-21: 2 g via INTRAVENOUS
  Filled 2014-09-21: qty 50

## 2014-09-21 SURGICAL SUPPLY — 61 items
BANDAGE ELASTIC 4 VELCRO ST LF (GAUZE/BANDAGES/DRESSINGS) ×2 IMPLANT
BANDAGE ELASTIC 6 VELCRO ST LF (GAUZE/BANDAGES/DRESSINGS) ×2 IMPLANT
BANDAGE ESMARK 6X9 LF (GAUZE/BANDAGES/DRESSINGS) ×1 IMPLANT
BENZOIN TINCTURE PRP APPL 2/3 (GAUZE/BANDAGES/DRESSINGS) ×2 IMPLANT
BLADE SAG 18X100X1.27 (BLADE) ×4 IMPLANT
BNDG ESMARK 6X9 LF (GAUZE/BANDAGES/DRESSINGS) ×2
BOWL SMART MIX CTS (DISPOSABLE) ×2 IMPLANT
CAPT KNEE TOTAL 3 ×2 IMPLANT
CEMENT BONE SIMPLEX SPEEDSET (Cement) ×4 IMPLANT
COVER SURGICAL LIGHT HANDLE (MISCELLANEOUS) ×2 IMPLANT
CUFF TOURNIQUET SINGLE 34IN LL (TOURNIQUET CUFF) ×2 IMPLANT
DRAPE EXTREMITY T 121X128X90 (DRAPE) ×2 IMPLANT
DRAPE IMP U-DRAPE 54X76 (DRAPES) ×2 IMPLANT
DRAPE PROXIMA HALF (DRAPES) ×2 IMPLANT
DRAPE U-SHAPE 47X51 STRL (DRAPES) ×2 IMPLANT
DRSG PAD ABDOMINAL 8X10 ST (GAUZE/BANDAGES/DRESSINGS) ×2 IMPLANT
DURAPREP 26ML APPLICATOR (WOUND CARE) ×4 IMPLANT
ELECT CAUTERY BLADE 6.4 (BLADE) ×2 IMPLANT
ELECT REM PT RETURN 9FT ADLT (ELECTROSURGICAL) ×2
ELECTRODE REM PT RTRN 9FT ADLT (ELECTROSURGICAL) ×1 IMPLANT
EVACUATOR 1/8 PVC DRAIN (DRAIN) ×2 IMPLANT
FACESHIELD WRAPAROUND (MASK) ×4 IMPLANT
GAUZE SPONGE 4X4 12PLY STRL (GAUZE/BANDAGES/DRESSINGS) ×2 IMPLANT
GLOVE BIOGEL PI IND STRL 7.0 (GLOVE) ×1 IMPLANT
GLOVE BIOGEL PI INDICATOR 7.0 (GLOVE) ×1
GLOVE ORTHO TXT STRL SZ7.5 (GLOVE) ×2 IMPLANT
GLOVE SURG ORTHO 7.0 STRL STRW (GLOVE) ×2 IMPLANT
GOWN STRL REUS W/ TWL LRG LVL3 (GOWN DISPOSABLE) ×2 IMPLANT
GOWN STRL REUS W/ TWL XL LVL3 (GOWN DISPOSABLE) ×1 IMPLANT
GOWN STRL REUS W/TWL LRG LVL3 (GOWN DISPOSABLE) ×2
GOWN STRL REUS W/TWL XL LVL3 (GOWN DISPOSABLE) ×1
HANDPIECE INTERPULSE COAX TIP (DISPOSABLE) ×1
IMMOBILIZER KNEE 22 UNIV (SOFTGOODS) ×2 IMPLANT
IMMOBILIZER KNEE 24 THIGH 36 (MISCELLANEOUS) IMPLANT
IMMOBILIZER KNEE 24 UNIV (MISCELLANEOUS)
KIT BASIN OR (CUSTOM PROCEDURE TRAY) ×2 IMPLANT
KIT ROOM TURNOVER OR (KITS) ×2 IMPLANT
MANIFOLD NEPTUNE II (INSTRUMENTS) ×2 IMPLANT
NEEDLE 18GX1X1/2 (RX/OR ONLY) (NEEDLE) ×2 IMPLANT
NEEDLE HYPO 25GX1X1/2 BEV (NEEDLE) ×2 IMPLANT
NS IRRIG 1000ML POUR BTL (IV SOLUTION) ×2 IMPLANT
PACK TOTAL JOINT (CUSTOM PROCEDURE TRAY) ×2 IMPLANT
PACK UNIVERSAL I (CUSTOM PROCEDURE TRAY) ×2 IMPLANT
PAD ARMBOARD 7.5X6 YLW CONV (MISCELLANEOUS) ×4 IMPLANT
PAD CAST 4YDX4 CTTN HI CHSV (CAST SUPPLIES) ×1 IMPLANT
PADDING CAST COTTON 4X4 STRL (CAST SUPPLIES) ×1
SET HNDPC FAN SPRY TIP SCT (DISPOSABLE) ×1 IMPLANT
STRIP CLOSURE SKIN 1/2X4 (GAUZE/BANDAGES/DRESSINGS) ×4 IMPLANT
SUCTION FRAZIER TIP 10 FR DISP (SUCTIONS) ×2 IMPLANT
SUT MNCRL AB 4-0 PS2 18 (SUTURE) ×2 IMPLANT
SUT VIC AB 0 CT1 27 (SUTURE)
SUT VIC AB 0 CT1 27XBRD ANBCTR (SUTURE) IMPLANT
SUT VIC AB 1 CTX 36 (SUTURE) ×2
SUT VIC AB 1 CTX36XBRD ANBCTR (SUTURE) ×2 IMPLANT
SUT VIC AB 2-0 CT1 27 (SUTURE) ×2
SUT VIC AB 2-0 CT1 TAPERPNT 27 (SUTURE) ×2 IMPLANT
SYR 50ML LL SCALE MARK (SYRINGE) ×2 IMPLANT
SYR CONTROL 10ML LL (SYRINGE) ×2 IMPLANT
TOWEL OR 17X24 6PK STRL BLUE (TOWEL DISPOSABLE) ×2 IMPLANT
TOWEL OR 17X26 10 PK STRL BLUE (TOWEL DISPOSABLE) ×2 IMPLANT
WATER STERILE IRR 1000ML POUR (IV SOLUTION) ×2 IMPLANT

## 2014-09-21 NOTE — H&P (View-Only) (Signed)
TOTAL KNEE ADMISSION H&P  Patient is being admitted for left total knee arthroplasty.  Subjective:  Chief Complaint:left knee pain.  HPI: Susan Welch, 69 y.o. female, has a history of pain and functional disability in the left knee due to arthritis and has failed non-surgical conservative treatments for greater than 12 weeks to includeNSAID's and/or analgesics.  Onset of symptoms was gradual, starting 5 years ago with gradually worsening course since that time. The patient noted no past surgery on the left knee(s).  Patient currently rates pain in the left knee(s) at 5 out of 10 with activity. Patient has night pain.  Patient has evidence of subchondral sclerosis and joint space narrowing by imaging studies. There is no active infection.  Patient Active Problem List   Diagnosis Date Noted  . Ischemic cardiomyopathy   . S/P CABG x 4 08/20/2012  . Knee joint replacement by other means 07/26/2012  . Osteoarthritis of right knee 07/16/2012  . HTN (hypertension) 09/20/2011  . Lipid disorder 09/20/2011   Past Medical History  Diagnosis Date  . Anemia   . Hypertension   . Anxiety     Hx: of situational anxiety  . Headache(784.0)     Hx: of migraines  . Hyperlipidemia   . Panic attacks   . Ventricular fibrillation     a. 07/15/2012 in setting of R TKA  . Ischemic cardiomyopathy     Echo 07/19/12: EF 40-45%, basal to mid anteroseptal AK, distal anteroseptal HK, mild MR.   . Coronary artery disease     a. s/p VF arrest during TKR 7/14 2/2 NSTEMI => LHC with 3v CAD =>  s/p CABG 07/21/12 (LIMA-LAD, SVG-RI and OM1, SVG-distal RCA).   . Carotid stenosis     Pre-CABG Dopplers 07/19/12: Right 40-59%.  . DVT (deep venous thrombosis) 07/2012  . Arthritis     a. bilat knees s/p R TKA 07/15/2012    Past Surgical History  Procedure Laterality Date  . Abdominal hysterectomy    . Dilation and curettage of uterus    . Total knee arthroplasty Right 07/15/2012    Procedure: TOTAL KNEE ARTHROPLASTY;   Surgeon: [name], MD;  Location: [place];  Service: Orthopedics;  Laterality: Right;  drapes pulled back to provide cpr, new drape applied, protocol followed  . Coronary artery bypass graft N/A 07/21/2012    Procedure: CORONARY ARTERY BYPASS GRAFTING (CABG);  Surgeon: Grace Isaac, MD;  Location: [place];  Service: Open Heart Surgery;  Laterality: N/A;  Times 4 using left internal mammary artery and endoscopically harvested left saphenous vein.  . Intraoperative transesophageal echocardiogram N/A 07/21/2012    Procedure: INTRAOPERATIVE TRANSESOPHAGEAL ECHOCARDIOGRAM;  Surgeon: Grace Isaac, MD;  Location: [place];  Service: Open Heart Surgery;  Laterality: N/A;  . Left heart catheterization with coronary angiogram N/A 07/17/2012    Procedure: LEFT HEART CATHETERIZATION WITH CORONARY ANGIOGRAM;  Surgeon: Minus Breeding, MD;  Location: [facility] CATH LAB;  Service: Cardiovascular;  Laterality: N/A;     (Not in a hospital admission) Allergies  Allergen Reactions  . Statins Other (See Comments)    Joint pain with Lipitor daily, Pravastatin 10 mg qd and Pravastatin 10 mg M/W/F, Crestor 10 mg once weekly also caused joint aches  . Nickel Rash    Social History  Substance Use Topics  . Smoking status: Former Smoker    Quit date: 01/08/2000  . Smokeless tobacco: Not on file  . Alcohol Use: Yes     Comment: "occasional wine"  Family History  Problem Relation Age of Onset  . Thyroid disease Mother     has thyroid removed  . Colitis Mother   . COPD Sister      Review of Systems  Constitutional: Negative.   HENT: Negative.   Eyes: Negative.   Respiratory: Negative.   Cardiovascular: Negative.   Gastrointestinal: Negative.   Genitourinary: Negative.   Musculoskeletal: Positive for joint pain.  Skin: Negative.   Neurological: Negative.   Endo/Heme/Allergies: Negative.   Psychiatric/Behavioral: Negative.     Objective:  Physical Exam  Constitutional: She is oriented to person,  place, and time. She appears well-developed and well-nourished.  HENT:  Head: Normocephalic and atraumatic.  Eyes: EOM are normal. Pupils are equal, round, and reactive to light.  Neck: Normal range of motion. Neck supple.  Cardiovascular: Normal rate and regular rhythm.   Respiratory: Effort normal and breath sounds normal. No respiratory distress. She has no wheezes. She has no rales.  GI: Soft. Bowel sounds are normal.  Musculoskeletal:  Examination of her left knee reveals a correctable varus deformity.  Range of motion 0-125 degrees.  Marked patellofemoral crepitus.  Marked medial joint line tenderness.  She is neurovascularly intact distally  Neurological: She is alert and oriented to person, place, and time.  Skin: Skin is warm and dry.  Psychiatric: She has a normal mood and affect. Her behavior is normal. Judgment and thought content normal.    Vital signs in last 24 hours: @VSRANGES @  Labs:   Estimated body mass index is 30.54 kg/(m^2) as calculated from the following:   Height as of 08/01/14: 5' 2.5" (1.588 m).   Weight as of 08/01/14: 77.021 kg (169 lb 12.8 oz).   Imaging Review Plain radiographs demonstrate severe degenerative joint disease of the left knee(s). The overall alignment ismild varus. The bone quality appears to be fair for age and reported activity level.  Assessment/Plan:  End stage arthritis, left knee   The patient history, physical examination, clinical judgment of the provider and imaging studies are consistent with end stage degenerative joint disease of the left knee(s) and total knee arthroplasty is deemed medically necessary. The treatment options including medical management, injection therapy arthroscopy and arthroplasty were discussed at length. The risks and benefits of total knee arthroplasty were presented and reviewed. The risks due to aseptic loosening, infection, stiffness, patella tracking problems, thromboembolic complications and other  imponderables were discussed. The patient acknowledged the explanation, agreed to proceed with the plan and consent was signed. Patient is being admitted for inpatient treatment for surgery, pain control, PT, OT, prophylactic antibiotics, VTE prophylaxis, progressive ambulation and ADL's and discharge planning. The patient is planning to be discharged to skilled nursing facility

## 2014-09-21 NOTE — Anesthesia Preprocedure Evaluation (Addendum)
Anesthesia Evaluation  Patient identified by MRN, date of birth, ID band Patient awake    Reviewed: Allergy & Precautions, NPO status , Patient's Chart, lab work & pertinent test results  History of Anesthesia Complications Negative for: history of anesthetic complications  Airway Mallampati: I  TM Distance: >3 FB Neck ROM: Full    Dental  (+) Teeth Intact, Dental Advisory Given   Pulmonary neg shortness of breath, neg sleep apnea, neg COPD, neg recent URI, former smoker,    breath sounds clear to auscultation       Cardiovascular hypertension, Pt. on medications and Pt. on home beta blockers (-) angina+ CAD, + CABG and + Peripheral Vascular Disease  (-) CHF (-) dysrhythmias (-) Valvular Problems/Murmurs Rhythm:Regular     Neuro/Psych  Headaches, neg Seizures PSYCHIATRIC DISORDERS Anxiety    GI/Hepatic Neg liver ROS, GERD  Controlled and Medicated,  Endo/Other  negative endocrine ROS  Renal/GU negative Renal ROS     Musculoskeletal  (+) Arthritis ,   Abdominal   Peds  Hematology negative hematology ROS (+)   Anesthesia Other Findings   Reproductive/Obstetrics                            Anesthesia Physical Anesthesia Plan  ASA: II  Anesthesia Plan: MAC and Spinal   Post-op Pain Management:    Induction: Intravenous  Airway Management Planned: Nasal Cannula  Additional Equipment: None  Intra-op Plan:   Post-operative Plan: Extubation in OR  Informed Consent: I have reviewed the patients History and Physical, chart, labs and discussed the procedure including the risks, benefits and alternatives for the proposed anesthesia with the patient or authorized representative who has indicated his/her understanding and acceptance.   Dental advisory given  Plan Discussed with: CRNA and Surgeon  Anesthesia Plan Comments:         Anesthesia Quick Evaluation

## 2014-09-21 NOTE — Anesthesia Postprocedure Evaluation (Signed)
  Anesthesia Post-op Note  Patient: Susan Welch  Procedure(s) Performed: Procedure(s): TOTAL LEFT KNEE ARTHROPLASTY (Left)  Patient Location: PACU  Anesthesia Type: MAC, Spinal, General   Level of Consciousness: awake, alert  and oriented  Airway and Oxygen Therapy: Patient Spontanous Breathing  Post-op Pain: mild  Post-op Assessment: Post-op Vital signs reviewed  Post-op Vital Signs: Reviewed  Last Vitals:  Filed Vitals:   09/21/14 2028  BP: 129/69  Pulse: 91  Temp: 36.9 C  Resp: 16    Complications: No apparent anesthesia complications

## 2014-09-21 NOTE — Interval H&P Note (Signed)
History and Physical Interval Note:  09/21/2014 8:27 AM  Susan Welch  has presented today for surgery, with the diagnosis of djd left knee  The various methods of treatment have been discussed with the patient and family. After consideration of risks, benefits and other options for treatment, the patient has consented to  Procedure(s): TOTAL LEFT KNEE ARTHROPLASTY (Left) as a surgical intervention .  The patient's history has been reviewed, patient examined, no change in status, stable for surgery.  I have reviewed the patient's chart and labs.  Questions were answered to the patient's satisfaction.     [name]

## 2014-09-21 NOTE — Progress Notes (Signed)
UR completed. Susan Welch, Susan Welch

## 2014-09-21 NOTE — Plan of Care (Signed)
Problem: Consults Goal: Diagnosis- Total Joint Replacement Primary Total Knee Left     

## 2014-09-21 NOTE — Progress Notes (Signed)
Orthopedic Tech Progress Note Patient Details:  Susan Welch [birth year] [id number]  Patient ID: [name], female   DOB: 04-Apr-1945, 69 y.o.   MRN: [id number] Ortho will provide ohf when it becomes available  Hildred Priest 09/21/2014, 11:14 AM

## 2014-09-21 NOTE — Anesthesia Procedure Notes (Signed)
Procedure Name: MAC Date/Time: 09/21/2014 8:35 AM Performed by: Jenne Campus Pre-anesthesia Checklist: Patient identified, Emergency Drugs available, Suction available, Patient being monitored and Timeout performed Patient Re-evaluated:Patient Re-evaluated prior to inductionOxygen Delivery Method: Simple face mask

## 2014-09-21 NOTE — Discharge Summary (Addendum)
Patient ID: Susan Welch MRN: [id number] DOB/AGE: 06-04-1945 69 y.o.  Admit date: 09/21/2014 Discharge date: 09/23/2014  Admission Diagnoses:  Active Problems:   DJD (degenerative joint disease) of knee   Discharge Diagnoses:  Same  Past Medical History  Diagnosis Date  . Anemia   . Hypertension   . Anxiety     Hx: of situational anxiety  . Headache(784.0)     Hx: of migraines  . Hyperlipidemia   . Panic attacks   . Ventricular fibrillation     a. 07/15/2012 in setting of R TKA  . Ischemic cardiomyopathy     Echo 07/19/12: EF 40-45%, basal to mid anteroseptal AK, distal anteroseptal HK, mild MR.   . Coronary artery disease     a. s/p VF arrest during TKR 7/14 2/2 NSTEMI => LHC with 3v CAD =>  s/p CABG 07/21/12 (LIMA-LAD, SVG-RI and OM1, SVG-distal RCA).   . Carotid stenosis     Pre-CABG Dopplers 07/19/12: Right 40-59%.  . DVT (deep venous thrombosis) 07/2012  . Arthritis     a. bilat knees s/p R TKA 07/15/2012  . Wears glasses   . Pneumonia   . GERD (gastroesophageal reflux disease)     Surgeries: Procedure(s): TOTAL LEFT KNEE ARTHROPLASTY on 09/21/2014   Consultants:    Discharged Condition: Improved  Hospital Course: Susan Welch is an 69 y.o. female who was admitted 09/21/2014 for operative treatment of primary localized osteoarthritis left knee. Patient has severe unremitting pain that affects sleep, daily activities, and work/hobbies. After pre-op clearance the patient was taken to the operating room on 09/21/2014 and underwent  Procedure(s): TOTAL LEFT KNEE ARTHROPLASTY.  Patient with a pre-op Hb of 14.2 developed ABLA on pod#1 with a Hb of 10.8 and 9.4 on pod#2.  She is currently stable but we will continue to follow.  Patient was given perioperative antibiotics:      Anti-infectives    Start     Dose/Rate Route Frequency Ordered Stop   09/21/14 1600  ceFAZolin (ANCEF) IVPB 1 g/50 mL premix     1 g 100 mL/hr over 30 Minutes Intravenous Every 6 hours 09/21/14 1451  09/21/14 2211   09/21/14 0700  ceFAZolin (ANCEF) IVPB 2 g/50 mL premix     2 g 100 mL/hr over 30 Minutes Intravenous To [facility] 09/21/14 0607 09/21/14 0835       Patient was given sequential compression devices, early ambulation, and chemoprophylaxis to prevent DVT.  Patient benefited maximally from hospital stay and there were no complications.    Recent vital signs:  Patient Vitals for the past 24 hrs:  BP Temp Temp src Pulse Resp SpO2  09/23/14 0649 (!) 142/61 mmHg 98.2 F (36.8 C) Oral 70 16 94 %  09/22/14 2114 (!) 138/59 mmHg 97.9 F (36.6 C) Oral 81 18 93 %  09/22/14 1300 (!) 111/51 mmHg 98.4 F (36.9 C) Oral 76 16 93 %  09/22/14 1120 140/64 mmHg - - 78 - -     Recent laboratory studies:   Recent Labs  09/22/14 0542 09/23/14 0404  WBC 11.0* 13.6*  HGB 10.8* 9.4*  HCT 32.9* 28.7*  PLT 270 219  NA 137 137  K 4.9 5.3*  CL 102 105  CO2 27 27  BUN 19 16  CREATININE 0.86 0.68  GLUCOSE 118* 125*  CALCIUM 8.4* 8.7*     Discharge Medications:     Medication List    STOP taking these medications        acetaminophen  500 MG tablet  Commonly known as:  TYLENOL     aspirin 81 MG EC tablet      TAKE these medications        apixaban 2.5 MG Tabs tablet  Commonly known as:  ELIQUIS  Take 1 tab po q 12 hours x 14 days following surgery to prevent blood clots     bisacodyl 5 MG EC tablet  Commonly known as:  DULCOLAX  Take 1 tablet (5 mg total) by mouth daily as needed for moderate constipation.     calcium citrate-vitamin D 315-200 MG-UNIT per tablet  Commonly known as:  CITRACAL+D  Take 1 tablet by mouth daily.     Co Q10 200 MG Caps  Take 200 mg by mouth daily.     [employer] FOLIC ACID 324 MCG tablet  Generic drug:  folic acid  Take 401 mcg by mouth daily.     ezetimibe 10 MG tablet  Commonly known as:  ZETIA  Take 1 tablet (10 mg total) by mouth daily.     Investigational - Study Medication  Inject 150 mg into the skin every 14 (fourteen)  days. bococizumab vs placebo     lisinopril 5 MG tablet  Commonly known as:  PRINIVIL,ZESTRIL  Take 1 tablet (5 mg total) by mouth 2 (two) times daily.     methocarbamol 500 MG tablet  Commonly known as:  ROBAXIN  Take 1 tablet (500 mg total) by mouth 4 (four) times daily.     metoprolol 50 MG tablet  Commonly known as:  LOPRESSOR  Take 1 tablet (50 mg total) by mouth 2 (two) times daily.     ondansetron 4 MG tablet  Commonly known as:  ZOFRAN  Take 1 tablet (4 mg total) by mouth every 8 (eight) hours as needed for nausea or vomiting.     oxyCODONE-acetaminophen 5-325 MG per tablet  Commonly known as:  ROXICET  Take 1-2 tablets by mouth every 4 (four) hours as needed.     Vitamin D 2000 UNITS tablet  Take 1 tablet (2,000 Units total) by mouth daily.        Diagnostic Studies: Dg Knee Left Port  09/21/2014   CLINICAL DATA:  Postop left total knee replacement  EXAM: PORTABLE LEFT KNEE - 1-2 VIEW  COMPARISON:  None.  FINDINGS: Left total knee arthroplasty.  Associated soft tissue gas and surgical drain.  No fracture or dislocation is seen.  Small suprapatellar knee joint effusion.  IMPRESSION: Left total knee arthroplasty in satisfactory position.   Electronically Signed   By: Julian Hy M.D.   On: 09/21/2014 12:53   Mm Screening Breast Tomo Bilateral  09/15/2014   CLINICAL DATA:  Screening.  EXAM: DIGITAL SCREENING BILATERAL MAMMOGRAM WITH 3D TOMO WITH CAD  COMPARISON:  Previous exam(s).  ACR Breast Density Category c: The breast tissue is heterogeneously dense, which may obscure small masses.  FINDINGS: There are no findings suspicious for malignancy. Images were processed with CAD.  IMPRESSION: No mammographic evidence of malignancy. A result letter of this screening mammogram will be mailed directly to the patient.  RECOMMENDATION: Screening mammogram in one year. (Code:[id number])  BI-RADS CATEGORY  1: Negative.   Electronically Signed   By: Everlean Alstrom M.D.   On:  09/15/2014 13:21    Disposition: 06-Home-Health Care Svc    Follow-up Information    Follow up with [name], MD. Schedule an appointment as soon as possible for a visit in 2 weeks.  Specialty:  Orthopedic Surgery   Contact information:   [address] [place] [address] [phone]        Signed: Fannie Knee 09/23/2014, 7:26 AM

## 2014-09-21 NOTE — Evaluation (Signed)
Physical Therapy Evaluation Patient Details Name: Susan Welch MRN: [id number] DOB: 10/20/1945 Today's Date: 09/21/2014   History of Present Illness  Patient admitted s/o L TKA, h/o R TKA, intraoperative arrest, cardiac cath and CABG x 4 in 07/2012.  Also w/ h/o DVT, HTN, headaches, anemia.  Clinical Impression  Patient presents with decreased independence with mobility due to deficits listed in PT problem list.  She will benefit from skilled PT in the acute setting to allow d/c home following SNF rehab stay.     Follow Up Recommendations SNF    Equipment Recommendations  None recommended by PT    Recommendations for Other Services       Precautions / Restrictions Precautions Precautions: Fall;Knee Required Braces or Orthoses: Knee Immobilizer - Left Restrictions Weight Bearing Restrictions: Yes LLE Weight Bearing: Weight bearing as tolerated      Mobility  Bed Mobility Overal bed mobility: Needs Assistance Bed Mobility: Supine to Sit     Supine to sit: Min assist     General bed mobility comments: for L LE  Transfers Overall transfer level: Needs assistance Equipment used: Rolling walker (2 wheeled) Transfers: Sit to/from [facility] Sit to Stand: Min assist Stand pivot transfers: Min assist       General transfer comment: for balance, cues for technique  Ambulation/Gait             General Gait Details: deferred due to post-op  Stairs            Wheelchair Mobility    Modified Rankin (Stroke Patients Only)       Balance Overall balance assessment: Needs assistance   Sitting balance-Leahy Scale: Good     Standing balance support: Bilateral upper extremity supported Standing balance-Leahy Scale: Poor Standing balance comment: UE support needed for balance                             Pertinent Vitals/Pain Pain Assessment: 0-10 Pain Score: 4  Pain Location: left knee with movement, resting without  pain Pain Intervention(s): Monitored during session;Ice applied    Home Living Family/patient expects to be discharged to:: Skilled nursing facility Living Arrangements: Alone Available Help at Discharge: Family;Available PRN/intermittently Type of Home: Apartment Home Access: Ramped entrance     Home Layout: One level Home Equipment: Walker - 4 wheels;Cane - single point;Tub bench      Prior Function Level of Independence: Independent with assistive device(s)               Hand Dominance        Extremity/Trunk Assessment               Lower Extremity Assessment: LLE deficits/detail   LLE Deficits / Details: AAROM 10-45, lifts leg independent from bed, ankle AROM WFL     Communication   Communication: No difficulties  Cognition Arousal/Alertness: Awake/alert Behavior During Therapy: WFL for tasks assessed/performed Overall Cognitive Status: Within Functional Limits for tasks assessed                      General Comments      Exercises Total Joint Exercises Ankle Circles/Pumps: AROM;Both;10 reps;Supine Quad Sets: AROM;Left;10 reps;Supine      Assessment/Plan    PT Assessment Patient needs continued PT services  PT Diagnosis Difficulty walking;Acute pain   PT Problem List Decreased strength;Decreased range of motion;Decreased balance;Decreased mobility;Pain;Decreased knowledge of use of DME  PT Treatment Interventions Balance  training;DME instruction;Gait training;Functional mobility training;Patient/family education;Therapeutic activities;Therapeutic exercise   PT Goals (Current goals can be found in the Care Plan section) Acute Rehab PT Goals Patient Stated Goal: To go to rehab prior to d/c home PT Goal Formulation: With patient Time For Goal Achievement: 09/28/14 Potential to Achieve Goals: Good    Frequency 7X/week   Barriers to discharge Decreased caregiver support      Co-evaluation               End of Session  Equipment Utilized During Treatment: Gait belt Activity Tolerance: Patient tolerated treatment well Patient left: in chair;with call bell/phone within reach Nurse Communication: Other (comment);Mobility status (heartburn)         Time: 3536-1443 PT Time Calculation (min) (ACUTE ONLY): 25 min   Charges:   PT Evaluation $Initial PT Evaluation Tier I: 1 Procedure PT Treatments $Therapeutic Activity: 8-22 mins   PT G Codes:        Varnell Donate,CYNDI 06-Oct-2014, 4:28 PM  Magda Kiel, [place] 10-06-14

## 2014-09-21 NOTE — Transfer of Care (Signed)
Immediate Anesthesia Transfer of Care Note  Patient: Susan Welch  Procedure(s) Performed: Procedure(s): TOTAL LEFT KNEE ARTHROPLASTY (Left)  Patient Location: PACU  Anesthesia Type:General and Spinal  Level of Consciousness: awake, oriented and patient cooperative  Airway & Oxygen Therapy: Patient Spontanous Breathing and Patient connected to face mask oxygen  Post-op Assessment: Report given to RN and Post -op Vital signs reviewed and stable  Post vital signs: Reviewed  Last Vitals:  Filed Vitals:   09/21/14 0704  BP: 189/76  Pulse: 65  Temp: 37.1 C  Resp: 20    Complications: No apparent anesthesia complications

## 2014-09-22 ENCOUNTER — Encounter (HOSPITAL_COMMUNITY): Payer: Self-pay | Admitting: Orthopedic Surgery

## 2014-09-22 LAB — BASIC METABOLIC PANEL
Anion gap: 8 (ref 5–15)
BUN: 19 mg/dL (ref 6–20)
CALCIUM: 8.4 mg/dL — AB (ref 8.9–10.3)
CO2: 27 mmol/L (ref 22–32)
CREATININE: 0.86 mg/dL (ref 0.44–1.00)
Chloride: 102 mmol/L (ref 101–111)
GFR calc non Af Amer: >60 mL/min (ref >60)
Glucose, Bld: 118 mg/dL — ABNORMAL HIGH (ref 65–99)
Potassium: 4.9 mmol/L (ref 3.5–5.1)
SODIUM: 137 mmol/L (ref 135–145)

## 2014-09-22 LAB — CBC
HCT: 32.9 % — ABNORMAL LOW (ref 36.0–46.0)
Hemoglobin: 10.8 g/dL — ABNORMAL LOW (ref 12.0–15.0)
MCH: 29.1 pg (ref 26.0–34.0)
MCHC: 32.8 g/dL (ref 30.0–36.0)
MCV: 88.7 fL (ref 78.0–100.0)
PLATELETS: 270 10*3/uL (ref 150–400)
RBC: 3.71 MIL/uL — ABNORMAL LOW (ref 3.87–5.11)
RDW: 14 % (ref 11.5–15.5)
WBC: 11 10*3/uL — ABNORMAL HIGH (ref 4.0–10.5)

## 2014-09-22 NOTE — Clinical Social Work Note (Signed)
Clinical Social Work Assessment  Patient Details  Name: Susan Welch MRN: [id number] Date of Birth: Aug 10, 1945  Date of referral:  09/22/14               Reason for consult:  Facility Placement, Discharge Planning                Permission sought to share information with:  Chartered certified accountant granted to share information::  Yes, Verbal Permission Granted  Name::        Agency::  [facility]  Relationship::     Contact Information:     Housing/Transportation Living arrangements for the past 2 months:  Single Family Home Source of Information:  Patient Patient Interpreter Needed:  None Criminal Activity/Legal Involvement Pertinent to Current Situation/Hospitalization:  No - Comment as needed Significant Relationships:  Adult Children Lives with:  Self Do you feel safe going back to the place where you live?  No (High fall risk.) Need for family participation in patient care:  No (Coment) (Patient able to make own decisions.)  Care giving concerns:  Patient expressed no concerns at this time.   Social Worker assessment / plan:  CSW received referral for possible SNF placement at time of discharge. CSW met with patient to discuss discharge disposition. Patient expressed understanding of PT recommendation for SNF placement at time of discharge. Per patient, patient has pre-registered with [facility]. CSW to continue to follow and assist with discharge planning needs.  Employment status:  Retired Forensic scientist:  Programmer, applications ([employer]) PT Recommendations:  [facility] [place] / Referral to community resources:  [place]  Patient/Family's Response to care:  Patient understanding and agreeable to CSW plan of care.  Patient/Family's Understanding of and Emotional Response to Diagnosis, Current Treatment, and Prognosis:  Patient understanding and agreeable to CSW  plan of care.  Emotional Assessment Appearance:  Appears stated age Attitude/Demeanor/Rapport:  Other (Pleasant.) Affect (typically observed):  Pleasant, Accepting, Appropriate Orientation:  Oriented to Self, Oriented to Place, Oriented to  Time, Oriented to Situation Alcohol / Substance use:  Not Applicable Psych involvement (Current and /or in the community):  No (Comment) (Not appropriate on this admission.)  Discharge Needs  Concerns to be addressed:  No discharge needs identified Readmission within the last 30 days:  No Current discharge risk:  None Barriers to Discharge:  No Barriers Identified   Caroline Sauger, LCSW 09/22/2014, 12:29 PM [phone]

## 2014-09-22 NOTE — Evaluation (Signed)
Occupational Therapy Evaluation and Discharge Summary Patient Details Name: Susan Welch MRN: [id number] DOB: 1945-11-05 Today's Date: 09/22/2014    History of Present Illness Patient admitted s/o L TKA, h/o R TKA, intraoperative arrest, cardiac cath and CABG x 4 in 07/2012.  Also w/ h/o DVT, HTN, headaches, anemia.   Clinical Impression   Pt admitted for above diagnosis and has the deficits listed below although is doing very well with basic adls.  Since pt lives alone, feel SNF is best option.  All other OT needs can be addressed at SNF.    Follow Up Recommendations  SNF    Equipment Recommendations  None recommended by OT    Recommendations for Other Services       Precautions / Restrictions Precautions Precautions: Fall;Knee Required Braces or Orthoses: Knee Immobilizer - Left Restrictions Weight Bearing Restrictions: Yes LLE Weight Bearing: Weight bearing as tolerated      Mobility Bed Mobility Overal bed mobility: Needs Assistance Bed Mobility: Supine to Sit     Supine to sit: Supervision;HOB elevated        Transfers Overall transfer level: Needs assistance Equipment used: Rolling walker (2 wheeled) Transfers: Sit to/from [facility] Sit to Stand: Supervision Stand pivot transfers: Supervision       General transfer comment: Cues for hand placement    Balance Overall balance assessment: Needs assistance Sitting-balance support: Feet supported Sitting balance-Leahy Scale: Good     Standing balance support: Bilateral upper extremity supported;During functional activity Standing balance-Leahy Scale: Fair Standing balance comment: Pt able to let go of walker for short amounts of time to pull up pants and clean self on commode.                            ADL Overall ADL's : Needs assistance/impaired Eating/Feeding: Independent;Sitting   Grooming: Set up;Standing   Upper Body Bathing: Set up;Sitting   Lower Body  Bathing: Minimal assistance;Sit to/from stand   Upper Body Dressing : Set up;Sitting   Lower Body Dressing: Moderate assistance;Sit to/from stand   Toilet Transfer: Supervision/safety;Ambulation;Comfort height toilet;Grab bars   Toileting- Clothing Manipulation and Hygiene: Supervision/safety;Sit to/from stand       Functional mobility during ADLs: Min guard;Rolling walker General ADL Comments: Pt doing very well with basic adls requiring mod assist for LE dressing and otherwise is min guard.     Vision     Perception     Praxis      Pertinent Vitals/Pain Pain Assessment: 0-10 Pain Score: 4  Pain Location: l knee Pain Descriptors / Indicators: Aching Pain Intervention(s): Repositioned;Monitored during session     Hand Dominance Right   Extremity/Trunk Assessment Upper Extremity Assessment Upper Extremity Assessment: Overall WFL for tasks assessed   Lower Extremity Assessment Lower Extremity Assessment: Defer to PT evaluation   Cervical / Trunk Assessment Cervical / Trunk Assessment: Normal   Communication Communication Communication: No difficulties   Cognition Arousal/Alertness: Awake/alert Behavior During Therapy: WFL for tasks assessed/performed Overall Cognitive Status: Within Functional Limits for tasks assessed                     General Comments       Exercises       Shoulder Instructions      Home Living Family/patient expects to be discharged to:: Skilled nursing facility Living Arrangements: Alone Available Help at Discharge: Family;Available PRN/intermittently Type of Home: Apartment Home Access: Ramped entrance  Home Layout: One level     Bathroom Shower/Tub: Tub/shower unit Shower/tub characteristics: Curtain       Home Equipment: Environmental consultant - 4 wheels;Cane - single point;Tub bench          Prior Functioning/Environment Level of Independence: Independent with assistive device(s)             OT Diagnosis:  Generalized weakness   OT Problem List: Decreased strength;Impaired balance (sitting and/or standing);Decreased knowledge of use of DME or AE;Pain   OT Treatment/Interventions:      OT Goals(Current goals can be found in the care plan section) Acute Rehab OT Goals Patient Stated Goal: To go to rehab prior to d/c home OT Goal Formulation: All assessment and education complete, DC therapy  OT Frequency:     Barriers to D/C:            Co-evaluation              End of Session Equipment Utilized During Treatment: Rolling walker CPM Left Knee CPM Left Knee: Off Nurse Communication: Mobility status  Activity Tolerance: Patient tolerated treatment well Patient left: in chair;with call bell/phone within reach;with family/visitor present   Time: 1610-9604 OT Time Calculation (min): 20 min Charges:  OT General Charges $OT Visit: 1 Procedure OT Evaluation $Initial OT Evaluation Tier I: 1 Procedure G-Codes:    Glenford Peers 10/05/2014, 12:20 PM  [phone]

## 2014-09-22 NOTE — Progress Notes (Signed)
Physical Therapy Treatment Patient Details Name: Susan Welch MRN: [id number] DOB: 10-Oct-1945 Today's Date: 09/22/2014    History of Present Illness Patient admitted s/o L TKA, h/o R TKA, intraoperative arrest, cardiac cath and CABG x 4 in 07/2012.  Also w/ h/o DVT, HTN, headaches, anemia.    PT Comments    Susan Welch is making good progress w/ therapy.  She ambulated 50 ft in hallway w/ one incident of Lt knee instability, no true knee buckle noted.  She is anticipating d/c to SNF today.  Follow Up Recommendations  SNF     Equipment Recommendations  None recommended by PT    Recommendations for Other Services       Precautions / Restrictions Precautions Precautions: Fall;Knee Required Braces or Orthoses: Knee Immobilizer - Left Restrictions Weight Bearing Restrictions: Yes LLE Weight Bearing: Weight bearing as tolerated    Mobility  Bed Mobility Overal bed mobility: Needs Assistance Bed Mobility: Supine to Sit;Sit to Supine     Supine to sit: Modified independent (Device/Increase time);HOB elevated Sit to supine: Min assist   General bed mobility comments: mod I w/ increased time for supine>sit and min assist managing Lt LE into bed for sit>supine  Transfers Overall transfer level: Needs assistance Equipment used: Rolling walker (2 wheeled) Transfers: Sit to/from Stand Sit to Stand: Supervision         General transfer comment: Pt w/ good technique.  No cues or physical assist needed.  Ambulation/Gait Ambulation/Gait assistance: Supervision Ambulation Distance (Feet): 50 Feet Assistive device: Rolling walker (2 wheeled) Gait Pattern/deviations: Step-to pattern;Step-through pattern;Antalgic;Trunk flexed;Decreased weight shift to left;Decreased stride length;Decreased stance time - left   Gait velocity interpretation: Below normal speed for age/gender General Gait Details: Cues to stand upright.  Pt w/ emerging step through pattern   Stairs             Wheelchair Mobility    Modified Rankin (Stroke Patients Only)       Balance Overall balance assessment: Needs assistance Sitting-balance support: No upper extremity supported;Feet supported Sitting balance-Leahy Scale: Good     Standing balance support: Bilateral upper extremity supported;During functional activity Standing balance-Leahy Scale: Fair                      Cognition Arousal/Alertness: Awake/alert Behavior During Therapy: WFL for tasks assessed/performed Overall Cognitive Status: Within Functional Limits for tasks assessed                      Exercises Total Joint Exercises Ankle Circles/Pumps: AROM;Both;10 reps;Supine Quad Sets: AROM;Left;10 reps;Supine Knee Flexion: AROM;AAROM;Left;5 reps;Seated Goniometric ROM: 0-82    General Comments        Pertinent Vitals/Pain Pain Assessment: 0-10 Pain Score: 4  Pain Location: Lt knee Pain Descriptors / Indicators: Aching Pain Intervention(s): Monitored during session;Limited activity within patient's tolerance;Repositioned    Home Living                      Prior Function            PT Goals (current goals can now be found in the care plan section) Acute Rehab PT Goals Patient Stated Goal: To go to rehab prior to d/c home PT Goal Formulation: With patient Time For Goal Achievement: 09/28/14 Potential to Achieve Goals: Good Progress towards PT goals: Progressing toward goals    Frequency  7X/week    PT Plan Current plan remains appropriate    Co-evaluation  End of Session   Activity Tolerance: Patient tolerated treatment well;Patient limited by fatigue Patient left: in bed;with call bell/phone within reach;with SCD's reapplied     Time: 7614-7092 PT Time Calculation (min) (ACUTE ONLY): 20 min  Charges:  $Gait Training: 8-22 mins                    G CodesJoslyn Welch PT, [place] [phone] Pager: [phone] 09/22/2014, 4:00 PM

## 2014-09-22 NOTE — Progress Notes (Signed)
Subjective: 1 Day Post-Op Procedure(s) (LRB): TOTAL LEFT KNEE ARTHROPLASTY (Left) Patient reports pain as mild.  No nausea/vomiting, chest pain/sob, lightheadedness/dizziness.  Negative flatus/bm.  Tolerating diet.  Objective: Vital signs in last 24 hours: Temp:  [97 F (36.1 C)-98.8 F (37.1 C)] 98.7 F (37.1 C) (09/15 0459) Pulse Rate:  [65-94] 84 (09/15 0459) Resp:  [8-20] 16 (09/15 0459) BP: (117-189)/(61-88) 126/67 mmHg (09/15 0459) SpO2:  [94 %-100 %] 94 % (09/15 0459) Weight:  [75.751 kg (167 lb)] 75.751 kg (167 lb) (09/14 0704)  Intake/Output from previous day: 09/14 0701 - 09/15 0700 In: 3318.3 [P.O.:390; I.V.:2828.3; IV Piggyback:100] Out: 2055 [Urine:1350; Drains:655; Blood:50] Intake/Output this shift: Total I/O In: 1616.7 [P.O.:390; I.V.:1176.7; IV Piggyback:50] Out: 1180 [Urine:950; Drains:230]  No results for input(s): HGB in the last 72 hours. No results for input(s): WBC, RBC, HCT, PLT in the last 72 hours.  Recent Labs  09/22/14 0542  NA 137  K 4.9  CL 102  CO2 27  BUN 19  CREATININE 0.86  GLUCOSE 118*  CALCIUM 8.4*   No results for input(s): LABPT, INR in the last 72 hours.  Neurologically intact Neurovascular intact Sensation intact distally Intact pulses distally Dorsiflexion/Plantar flexion intact Compartment soft hemovac drain pulled by me today  Assessment/Plan: 1 Day Post-Op Procedure(s) (LRB): TOTAL LEFT KNEE ARTHROPLASTY (Left) Advance diet Up with therapy Discharge to SNF today or tomorrow pending insurance approval WBAT LLE Dry dressing change prn   Fannie Knee 09/22/2014, 6:49 AM

## 2014-09-22 NOTE — Progress Notes (Signed)
Assisted nursing student pull/ obtain 0800 medications for this patient. Will administer Rx with observation [place] instructor.

## 2014-09-22 NOTE — Op Note (Signed)
NAME:  Susan Welch, Susan Welch NO.:  [id number]  MEDICAL RECORD NO.:  [id number]  LOCATION:  5N26C                        FACILITY:  [facility]  PHYSICIAN:  [name], M.D. DATE OF BIRTH:  1945/07/24  DATE OF PROCEDURE:  09/21/2014 DATE OF DISCHARGE:                              OPERATIVE REPORT   PREOPERATIVE DIAGNOSES:  Right knee end-stage arthritis, primary generalized.  Varus alignment, bone loss medial compartment.  POSTOPERATIVE DIAGNOSES:  Right knee end-stage arthritis, primary generalized.  Varus alignment, bone loss medial compartment.  PROCEDURE:  Modified minimally invasive left total knee replacement utilizing a [employer] Oxinium Titanium prosthesis.  A cemented pegged posterior stabilized #4 femoral component.  Cemented #3 tibial component, 9-mm PS polyethylene insert.  A 29 mm x 7.5 mm cemented pegged patellar component.  SURGEON:  [name], M.D.  ASSISTANT:  Elmyra Ricks, P.A., present throughout the entire case and necessary for timely completion of procedure.  ANESTHESIA:  Initially spinal which was ineffective, followed by general.  SPECIMENS:  None.  CULTURES:  None.  COMPLICATIONS:  None.  DRESSINGS:  Soft compressive knee immobilizer.  DRAINS:  Hemovac x1.  TOURNIQUET TIME:  45 minutes.  PROCEDURE:  The patient was brought to operating room, placed on the operating table in supine position.  After adequate anesthesia had been obtained, tourniquet applied.  Prepped and draped in usual sterile fashion.  Exsanguinated with elevation of Esmarch.  Tourniquet inflated to 350 mmHg.  Straight incision above the patella down to tibial tubercle.  Medial arthrotomy, vastus splitting, preserving quad tendon. Medial capsule released.  Intramedullary guide, distal femur.  9 mm resection, 5 degrees of valgus.  Using epicondylar axis, the femur was sized, cut, and fitted for pegged posterior stabilized #4  component. Proximal tibial resection with extramedullary guide.  Most of bone removed laterally because of the deficiency medially.  Sized to #3 component.  Patella exposed, posterior 7.5 mm removed, drill sized and fitted for 29 mm component.  Trials put in place.  With the 9-mm insert on the tibia, I was very pleased by mechanical axis, balancing, motion, stability, and patellar tracking.  Tibia was marked for rotation and hand reamed.  All trials removed.  Copious irrigation with a pulse irrigating device.  Cement prepared, placed on all components, firmly seated.  Polyethylene attached to tibia, knee reduced.  Patella with a clamp.  Once cement hardened, the knee was irrigated again.  Soft tissues injected with Exparel.  Hemovac placed.  Arthrotomy closed with Ethibond.  Subcutaneous and subcuticular closure.  Margins were injected with Marcaine.  Sterile compressive dressing applied.  Tourniquet deflated and removed.  Knee immobilizer applied.  Anesthesia reversed. Brought to the recovery room.  Tolerated the surgery well.  No complications.     [name], M.D.     DFM/MEDQ  D:  09/21/2014  T:  09/22/2014  Job:  [id number]

## 2014-09-22 NOTE — Discharge Instructions (Signed)
INSTRUCTIONS AFTER JOINT REPLACEMENT   o Remove items at home which could result in a fall. This includes throw rugs or furniture in walking pathways o ICE to the affected joint every three hours while awake for 30 minutes at a time, for at least the first 3-5 days, and then as needed for pain and swelling.  Continue to use ice for pain and swelling. You may notice swelling that will progress down to the foot and ankle.  This is normal after surgery.  Elevate your leg when you are not up walking on it.   o Continue to use the breathing machine you got in the hospital (incentive spirometer) which will help keep your temperature down.  It is common for your temperature to cycle up and down following surgery, especially at night when you are not up moving around and exerting yourself.  The breathing machine keeps your lungs expanded and your temperature down.  BLOOD THINNER:  TAKE ELIQUIS AS DIRECTED FOR A TOTAL OF 14 DAYS FOLLOWING SURGERY TO PREVENT BLOOD CLOTS.  ONCE FINISHED WITH ELIQUIS, TAKE ASPIRIN 325 MG ONE TAB ONCE DAILY FOR THE NEXT 14 DAYS.  THIS IS ALSO TO PREVENT BLOOD CLOTS.   DIET:  As you were doing prior to hospitalization, we recommend a well-balanced diet.  DRESSING / WOUND CARE / SHOWERING  You may change your dressing 3-5 days after surgery.  Then change the dressing every day with sterile gauze.  Please use good hand washing techniques before changing the dressing.  Do not use any lotions or creams on the incision until instructed by your surgeon. and You may shower 3 days after surgery, but keep the wounds dry during showering.  You may use an occlusive plastic wrap (Press'n Seal for example), NO SOAKING/SUBMERGING IN THE BATHTUB.  If the bandage gets wet, change with a clean dry gauze.  If the incision gets wet, pat the wound dry with a clean towel.  ACTIVITY  o Increase activity slowly as tolerated, but follow the weight bearing instructions below.   o No driving for 6 weeks  or until further direction given by your physician.  You cannot drive while taking narcotics.  o No lifting or carrying greater than 10 lbs. until further directed by your surgeon. o Avoid periods of inactivity such as sitting longer than an hour when not asleep. This helps prevent blood clots.  o You may return to work once you are authorized by your doctor.     WEIGHT BEARING   Weight bearing as tolerated with assist device (walker, cane, etc) as directed, use it as long as suggested by your surgeon or therapist, typically at least 4-6 weeks.   EXERCISES  Results after joint replacement surgery are often greatly improved when you follow the exercise, range of motion and muscle strengthening exercises prescribed by your doctor. Safety measures are also important to protect the joint from further injury. Any time any of these exercises cause you to have increased pain or swelling, decrease what you are doing until you are comfortable again and then slowly increase them. If you have problems or questions, call your caregiver or physical therapist for advice.   Rehabilitation is important following a joint replacement. After just a few days of immobilization, the muscles of the leg can become weakened and shrink (atrophy).  These exercises are designed to build up the tone and strength of the thigh and leg muscles and to improve motion. Often times heat used for twenty to  thirty minutes before working out will loosen up your tissues and help with improving the range of motion but do not use heat for the first two weeks following surgery (sometimes heat can increase post-operative swelling).   These exercises can be done on a training (exercise) mat, on the floor, on a table or on a bed. Use whatever works the best and is most comfortable for you.    Use music or television while you are exercising so that the exercises are a pleasant break in your day. This will make your life better with the  exercises acting as a break in your routine that you can look forward to.   Perform all exercises about fifteen times, three times per day or as directed.  You should exercise both the operative leg and the other leg as well.  Exercises include:    Quad Sets - Tighten up the muscle on the front of the thigh (Quad) and hold for 5-10 seconds.    Straight Leg Raises - With your knee straight (if you were given a brace, keep it on), lift the leg to 60 degrees, hold for 3 seconds, and slowly lower the leg.  Perform this exercise against resistance later as your leg gets stronger.   Leg Slides: Lying on your back, slowly slide your foot toward your buttocks, bending your knee up off the floor (only go as far as is comfortable). Then slowly slide your foot back down until your leg is flat on the floor again.   Angel Wings: Lying on your back spread your legs to the side as far apart as you can without causing discomfort.   Hamstring Strength:  Lying on your back, push your heel against the floor with your leg straight by tightening up the muscles of your buttocks.  Repeat, but this time bend your knee to a comfortable angle, and push your heel against the floor.  You may put a pillow under the heel to make it more comfortable if necessary.   A rehabilitation program following joint replacement surgery can speed recovery and prevent re-injury in the future due to weakened muscles. Contact your doctor or a physical therapist for more information on knee rehabilitation.    CONSTIPATION  Constipation is defined medically as fewer than three stools per week and severe constipation as less than one stool per week.  Even if you have a regular bowel pattern at home, your normal regimen is likely to be disrupted due to multiple reasons following surgery.  Combination of anesthesia, postoperative narcotics, change in appetite and fluid intake all can affect your bowels.   YOU MUST use at least one of the  following options; they are listed in order of increasing strength to get the job done.  They are all available over the counter, and you may need to use some, POSSIBLY even all of these options:    Drink plenty of fluids (prune juice may be helpful) and high fiber foods Colace 100 mg by mouth twice a day  Senokot for constipation as directed and as needed Dulcolax (bisacodyl), take with full glass of water  Miralax (polyethylene glycol) once or twice a day as needed.  If you have tried all these things and are unable to have a bowel movement in the first 3-4 days after surgery call either your surgeon or your primary doctor.    If you experience loose stools or diarrhea, hold the medications until you stool forms back up.  If your  symptoms do not get better within 1 week or if they get worse, check with your doctor.  If you experience "the worst abdominal pain ever" or develop nausea or vomiting, please contact the office immediately for further recommendations for treatment.   ITCHING:  If you experience itching with your medications, try taking only a single pain pill, or even half a pain pill at a time.  You can also use Benadryl over the counter for itching or also to help with sleep.   TED HOSE STOCKINGS:  Use stockings on both legs until for at least 2 weeks or as directed by physician office. They may be removed at night for sleeping.  MEDICATIONS:  See your medication summary on the After Visit Summary that nursing will review with you.  You may have some home medications which will be placed on hold until you complete the course of blood thinner medication.  It is important for you to complete the blood thinner medication as prescribed.  PRECAUTIONS:  If you experience chest pain or shortness of breath - call 911 immediately for transfer to the hospital emergency department.   If you develop a fever greater that 101 F, purulent drainage from wound, increased redness or drainage from  wound, foul odor from the wound/dressing, or calf pain - CONTACT YOUR SURGEON.                                                   FOLLOW-UP APPOINTMENTS:  If you do not already have a post-op appointment, please call the office for an appointment to be seen by your surgeon.  Guidelines for how soon to be seen are listed in your After Visit Summary, but are typically between 1-4 weeks after surgery.  OTHER INSTRUCTIONS:   Knee Replacement:  Do not place pillow under knee, focus on keeping the knee straight while resting. CPM instructions: 0-90 degrees, 2 hours in the morning, 2 hours in the afternoon, and 2 hours in the evening. Place foam block, curve side up under heel at all times except when in CPM or when walking.  DO NOT modify, tear, cut, or change the foam block in any way.  MAKE SURE YOU:   Understand these instructions.   Get help right away if you are not doing well or get worse.    Thank you for letting us be a part of your medical care team.  It is a privilege we respect greatly.  We hope these instructions will help you stay on track for a fast and full recovery!   Information on my medicine - ELIQUIS (apixaban)  This medication education was reviewed with me or my healthcare representative as part of my discharge preparation.  The pharmacist that spoke with me during my hospital stay was:  Arman Bogus, [facility]  Why was Eliquis prescribed for you? Eliquis was prescribed for you to reduce the risk of blood clots forming after orthopedic surgery.    What do You need to know about Eliquis? Take your Eliquis TWICE DAILY - one tablet in the morning and one tablet in the evening with or without food.  It would be best to take the dose about the same time each day.  If you have difficulty swallowing the tablet whole please discuss with your pharmacist how to take the medication safely.  Take  Eliquis exactly as prescribed by your doctor and DO NOT stop taking Eliquis  without talking to the doctor who prescribed the medication.  Stopping without other medication to take the place of Eliquis may increase your risk of developing a clot.  After discharge, you should have regular check-up appointments with your healthcare provider that is prescribing your Eliquis.  What do you do if you miss a dose? If a dose of ELIQUIS is not taken at the scheduled time, take it as soon as possible on the same day and twice-daily administration should be resumed.  The dose should not be doubled to make up for a missed dose.  Do not take more than one tablet of ELIQUIS at the same time.  Important Safety Information A possible side effect of Eliquis is bleeding. You should call your healthcare provider right away if you experience any of the following: ? Bleeding from an injury or your nose that does not stop. ? Unusual colored urine (red or dark brown) or unusual colored stools (red or black). ? Unusual bruising for unknown reasons. ? A serious fall or if you hit your head (even if there is no bleeding).  Some medicines may interact with Eliquis and might increase your risk of bleeding or clotting while on Eliquis. To help avoid this, consult your healthcare provider or pharmacist prior to using any new prescription or non-prescription medications, including herbals, vitamins, non-steroidal anti-inflammatory drugs (NSAIDs) and supplements.  This website has more information on Eliquis (apixaban): http://www.eliquis.com/eliquis/home

## 2014-09-23 LAB — BASIC METABOLIC PANEL
Anion gap: 5 (ref 5–15)
BUN: 16 mg/dL (ref 6–20)
CALCIUM: 8.7 mg/dL — AB (ref 8.9–10.3)
CHLORIDE: 105 mmol/L (ref 101–111)
CO2: 27 mmol/L (ref 22–32)
CREATININE: 0.68 mg/dL (ref 0.44–1.00)
Glucose, Bld: 125 mg/dL — ABNORMAL HIGH (ref 65–99)
Potassium: 5.3 mmol/L — ABNORMAL HIGH (ref 3.5–5.1)
SODIUM: 137 mmol/L (ref 135–145)

## 2014-09-23 LAB — CBC
HCT: 28.7 % — ABNORMAL LOW (ref 36.0–46.0)
HEMOGLOBIN: 9.4 g/dL — AB (ref 12.0–15.0)
MCH: 29.1 pg (ref 26.0–34.0)
MCHC: 32.8 g/dL (ref 30.0–36.0)
MCV: 88.9 fL (ref 78.0–100.0)
PLATELETS: 219 10*3/uL (ref 150–400)
RBC: 3.23 MIL/uL — ABNORMAL LOW (ref 3.87–5.11)
RDW: 14.2 % (ref 11.5–15.5)
WBC: 13.6 10*3/uL — ABNORMAL HIGH (ref 4.0–10.5)

## 2014-09-23 NOTE — Progress Notes (Signed)
Subjective: 2 Days Post-Op Procedure(s) (LRB): TOTAL LEFT KNEE ARTHROPLASTY (Left) Patient reports pain as mild.  No nausea/vomiting, chest pain/sob, lightheadedness/dizziness.  Positive flatus no bm.  Tolerating diet.  Objective: Vital signs in last 24 hours: Temp:  [97.9 F (36.6 C)-98.4 F (36.9 C)] 98.2 F (36.8 C) (09/16 0649) Pulse Rate:  [70-81] 70 (09/16 0649) Resp:  [16-18] 16 (09/16 0649) BP: (111-142)/(51-64) 142/61 mmHg (09/16 0649) SpO2:  [93 %-94 %] 94 % (09/16 0649)  Intake/Output from previous day: 09/15 0701 - 09/16 0700 In: 720 [P.O.:720] Out: -  Intake/Output this shift:     Recent Labs  09/22/14 0542 09/23/14 0404  HGB 10.8* 9.4*    Recent Labs  09/22/14 0542 09/23/14 0404  WBC 11.0* 13.6*  RBC 3.71* 3.23*  HCT 32.9* 28.7*  PLT 270 219    Recent Labs  09/22/14 0542 09/23/14 0404  NA 137 137  K 4.9 5.3*  CL 102 105  CO2 27 27  BUN 19 16  CREATININE 0.86 0.68  GLUCOSE 118* 125*  CALCIUM 8.4* 8.7*   No results for input(s): LABPT, INR in the last 72 hours.  Neurologically intact Neurovascular intact Sensation intact distally Intact pulses distally Dorsiflexion/Plantar flexion intact Incision: dressing C/D/I No cellulitis present Compartment soft  Negative homans bilaterally Dressing changed by me today  Assessment/Plan: 2 Days Post-Op Procedure(s) (LRB): TOTAL LEFT KNEE ARTHROPLASTY (Left) Advance diet Up with therapy Discharge to SNF today WBAT LLE ABLA-mild and stable Dry dressing change prn  Fannie Knee 09/23/2014, 7:43 AM

## 2014-09-23 NOTE — Discharge Planning (Signed)
Patient to be discharged to [facility]. Patient and patient's family updated at bedside.  [employer] Authorization received 09/22/2014: [id number] Next review date: 09/25/2014 RUG level: RUA  Facility: [facility] RN report number: [phone] (Room 111) Transportation: car  [facility] Say[place], [place] [facility] [place] [phone]) and Surgical [phone])

## 2014-09-23 NOTE — Progress Notes (Signed)
Physical Therapy Treatment Patient Details Name: Susan Welch MRN: [id number] DOB: 1945-10-30 Today's Date: 09/23/2014    History of Present Illness Patient admitted s/o L TKA, h/o R TKA, intraoperative arrest, cardiac cath and CABG x 4 in 07/2012.  Also w/ h/o DVT, HTN, headaches, anemia.    PT Comments    Pt with improved gait pattern today.  Cueing for proper technique with gait and therex.  Pt is motivated to work with PT.  Follow Up Recommendations  SNF     Equipment Recommendations  None recommended by PT    Recommendations for Other Services       Precautions / Restrictions Precautions Precautions: Fall;Knee Restrictions LLE Weight Bearing: Weight bearing as tolerated    Mobility  Bed Mobility Overal bed mobility: Needs Assistance Bed Mobility: Supine to Sit;Sit to Supine     Supine to sit: Supervision Sit to supine: Supervision   General bed mobility comments: cues for technique   Transfers   Equipment used: Rolling walker (2 wheeled) Transfers: Sit to/from Stand Sit to Stand: Supervision         General transfer comment: Pt w/ good technique.  No cues or physical assist needed.  Ambulation/Gait Ambulation/Gait assistance: Min guard Ambulation Distance (Feet): 120 Feet Assistive device: Rolling walker (2 wheeled) Gait Pattern/deviations: Step-through pattern;Trunk flexed   Gait velocity interpretation: Below normal speed for age/gender General Gait Details: Cues for posture.  Improved step length today. No buckeling noted.   Stairs            Wheelchair Mobility    Modified Rankin (Stroke Patients Only)       Balance     Sitting balance-Leahy Scale: Good       Standing balance-Leahy Scale: Fair                      Cognition Arousal/Alertness: Awake/alert Behavior During Therapy: WFL for tasks assessed/performed Overall Cognitive Status: Within Functional Limits for tasks assessed                       Exercises Total Joint Exercises Ankle Circles/Pumps: AROM;Both;10 reps;Supine Quad Sets: AROM;Left;10 reps;Supine Heel Slides: AAROM;Left;10 reps;Supine Hip ABduction/ADduction: AROM;Left;10 reps;Supine Straight Leg Raises: Strengthening;Left;10 reps;Supine    General Comments        Pertinent Vitals/Pain Pain Assessment: 0-10 Pain Score: 3  Pain Location: L knee Pain Descriptors / Indicators: Aching;Operative site guarding Pain Intervention(s): Monitored during session;Repositioned (declined ice)    Home Living                      Prior Function            PT Goals (current goals can now be found in the care plan section) Acute Rehab PT Goals Patient Stated Goal: To go to rehab prior to d/c home PT Goal Formulation: With patient Time For Goal Achievement: 09/28/14 Potential to Achieve Goals: Good Progress towards PT goals: Progressing toward goals    Frequency  7X/week    PT Plan Current plan remains appropriate    Co-evaluation             End of Session Equipment Utilized During Treatment: Gait belt Activity Tolerance: Patient tolerated treatment well Patient left: in chair;with call bell/phone within reach     Time: 1152-1215 PT Time Calculation (min) (ACUTE ONLY): 23 min  Charges:  $Gait Training: 8-22 mins $Therapeutic Exercise: 8-22 mins  G Codes:      SMITH,KAREN LUBECK 09/23/2014, 12:50 PM

## 2014-09-23 NOTE — Clinical Social Work Placement (Signed)
   CLINICAL SOCIAL WORK PLACEMENT  NOTE  Date:  09/23/2014  Patient Details  Name: Susan Welch MRN: [id number] Date of Birth: November 09, 1945  Clinical Social Work is seeking post-discharge placement for this patient at the [place] level of care (*CSW will initial, date and re-position this form in  chart as items are completed):  Yes   Patient/family provided with [facility] Work Department's list of facilities offering this level of care within the geographic area requested by the patient (or if unable, by the patient's family).  Yes   Patient/family informed of their freedom to choose among providers that offer the needed level of care, that participate in Medicare, Medicaid or managed care program needed by the patient, have an available bed and are willing to accept the patient.  Yes   Patient/family informed of [facility]'s ownership interest in [facility] and [facility], as well as of the fact that they are under no obligation to receive care at these facilities.  PASRR submitted to EDS on 09/23/14     PASRR number received on 09/23/14     Existing PASRR number confirmed on  (n/a)     FL2 transmitted to all facilities in geographic area requested by pt/family on 09/23/14     FL2 transmitted to all facilities within larger geographic area on  (n/a)     Patient informed that his/her managed care company has contracts with or will negotiate with certain facilities, including the following:   (yes, [facility])     Yes   Patient/family informed of bed offers received.  Patient chooses bed at [facility]     Physician recommends and patient chooses bed at      Patient to be transferred to [facility] on 09/23/14.  Patient to be transferred to facility by car     Patient family notified on 09/23/14 of transfer.  Name of family member notified:  Patient at bedside.     PHYSICIAN     Additional Comment:    _______________________________________________ Caroline Sauger, LCSW 09/23/2014, 11:13 AM

## 2014-09-23 NOTE — Care Management Note (Signed)
Case Management Note  Patient Details  Name: Susan Welch MRN: [id number] Date of Birth: 22-Mar-1945  Subjective/Objective:         S/p left total knee arthroplasty           Action/Plan: PT/OT recommended SNF, referral made to CSW. Per CSW patient to discharge to [facility].   Expected Discharge Date:                  Expected Discharge Plan:  Skilled Nursing Facility  In-House Referral:  Clinical Social Work  Discharge planning Services  NA  Post Acute Care Choice:  NA Choice offered to:     DME Arranged:    DME Agency:     HH Arranged:     Agency:     Status of Service:  Completed, signed off  Medicare Important Message Given:    Date Medicare IM Given:    Medicare IM give by:    Date Additional Medicare IM Given:    Additional Medicare Important Message give by:     If discussed at [place] of Stay Meetings, dates discussed:    Additional Comments:  Nila Nephew, RN 09/23/2014, 8:29 AM

## 2014-09-23 NOTE — Progress Notes (Signed)
Patient is going to [facility] for further therapy.  Called report to [place].  Patient is being transported by her grand daughter today.

## 2014-10-12 ENCOUNTER — Other Ambulatory Visit: Payer: Self-pay | Admitting: Cardiology

## 2014-11-11 ENCOUNTER — Encounter: Payer: Self-pay | Admitting: *Deleted

## 2014-11-11 DIAGNOSIS — Z006 Encounter for examination for normal comparison and control in clinical research program: Secondary | ICD-10-CM

## 2014-11-11 NOTE — Progress Notes (Signed)
Informational telephone call to inform patient that the SPIRE I and SPIRE II Research study has come to a close and NOT to take any more injections of the investigational Product. Patient states her last dose November 3. Informed patient that we will be contacting patient next week to schedule End of study visit. Questions encouraged and answered.

## 2014-12-27 ENCOUNTER — Encounter: Payer: Self-pay | Admitting: *Deleted

## 2014-12-27 DIAGNOSIS — Z006 Encounter for examination for normal comparison and control in clinical research program: Secondary | ICD-10-CM

## 2014-12-27 NOTE — Progress Notes (Signed)
Subject has completed participation in the [facility] research study.  No adverse/serious events to report.

## 2015-05-09 DIAGNOSIS — Z96652 Presence of left artificial knee joint: Secondary | ICD-10-CM | POA: Diagnosis not present

## 2015-06-01 DIAGNOSIS — L821 Other seborrheic keratosis: Secondary | ICD-10-CM | POA: Diagnosis not present

## 2015-06-01 DIAGNOSIS — L820 Inflamed seborrheic keratosis: Secondary | ICD-10-CM | POA: Diagnosis not present

## 2015-06-01 DIAGNOSIS — D485 Neoplasm of uncertain behavior of skin: Secondary | ICD-10-CM | POA: Diagnosis not present

## 2015-06-01 DIAGNOSIS — D1724 Benign lipomatous neoplasm of skin and subcutaneous tissue of left leg: Secondary | ICD-10-CM | POA: Diagnosis not present

## 2015-06-29 NOTE — Progress Notes (Signed)
HPI Mrs. Folkert returns for followup after bypass. She was admitted for total knee replacement and had a V. Fib arrest and was found to have three-vessel disease as described. She has a mildly reduced ejection fraction.  She returns for follow up.  Since I last saw her she had TKR.  She has done very well with this.  She is walking daily. She volunteers at the hospital. The patient denies any new symptoms such as chest discomfort, neck or arm discomfort. There has been no new shortness of breath, PND or orthopnea. There have been no reported palpitations, presyncope or syncope.     Allergies  Allergen Reactions  . Oxycodone Other (See Comments)    Flu like symptoms.  . Statins Other (See Comments)    Joint pain with Lipitor daily, Pravastatin 10 mg qd and Pravastatin 10 mg M/W/F, Crestor 10 mg once weekly also caused joint aches  . Nickel Rash    Current Outpatient Prescriptions  Medication Sig Dispense Refill  . aspirin 81 MG tablet Take 81 mg by mouth daily.    . calcium citrate-vitamin D (CITRACAL+D) 315-200 MG-UNIT per tablet Take 1 tablet by mouth daily.    . Cholecalciferol (VITAMIN D) 2000 UNITS tablet Take 1 tablet (2,000 Units total) by mouth daily. 30 tablet 0  . Coenzyme Q10 (CO Q10) 200 MG CAPS Take 200 mg by mouth daily. 30 capsule 0  . ezetimibe (ZETIA) 10 MG tablet Take 1 tablet (10 mg total) by mouth daily. 30 tablet 11  . folic acid ([employer]S FOLIC ACID) A999333 MCG tablet Take 400 mcg by mouth daily.    Marland Kitchen lisinopril (PRINIVIL,ZESTRIL) 5 MG tablet Take 1 tablet (5 mg total) by mouth 2 (two) times daily. 60 tablet 11  . metoprolol (LOPRESSOR) 50 MG tablet Take 1 tablet (50 mg total) by mouth 2 (two) times daily. 60 tablet 11   No current facility-administered medications for this visit.    Past Medical History  Diagnosis Date  . Anemia   . Hypertension   . Anxiety     Hx: of situational anxiety  . Headache(784.0)     Hx: of migraines  . Hyperlipidemia   . Panic  attacks   . Ventricular fibrillation ([place])     a. 07/15/2012 in setting of R TKA  . Ischemic cardiomyopathy     Echo 07/19/12: EF 40-45%, basal to mid anteroseptal AK, distal anteroseptal HK, mild MR.   . Coronary artery disease     a. s/p VF arrest during TKR 7/14 2/2 NSTEMI => LHC with 3v CAD =>  s/p CABG 07/21/12 (LIMA-LAD, SVG-RI and OM1, SVG-distal RCA).   . Carotid stenosis     Pre-CABG Dopplers 07/19/12: Right 40-59%.  . DVT (deep venous thrombosis) ([place]) 07/2012  . Arthritis     a. bilat knees s/p R TKA 07/15/2012  . Wears glasses   . Pneumonia   . GERD (gastroesophageal reflux disease)     Past Surgical History  Procedure Laterality Date  . Abdominal hysterectomy    . Dilation and curettage of uterus    . Total knee arthroplasty Right 07/15/2012    Procedure: TOTAL KNEE ARTHROPLASTY;  Surgeon: [name], MD;  Location: [place];  Service: Orthopedics;  Laterality: Right;  drapes pulled back to provide cpr, new drape applied, protocol followed  . Coronary artery bypass graft N/A 07/21/2012    Procedure: CORONARY ARTERY BYPASS GRAFTING (CABG);  Surgeon: Grace Isaac, MD;  Location: [place];  Service: Open Heart Surgery;  Laterality: N/A;  Times 4 using left internal mammary artery and endoscopically harvested left saphenous vein.  . Intraoperative transesophageal echocardiogram N/A 07/21/2012    Procedure: INTRAOPERATIVE TRANSESOPHAGEAL ECHOCARDIOGRAM;  Surgeon: Grace Isaac, MD;  Location: [place];  Service: Open Heart Surgery;  Laterality: N/A;  . Left heart catheterization with coronary angiogram N/A 07/17/2012    Procedure: LEFT HEART CATHETERIZATION WITH CORONARY ANGIOGRAM;  Surgeon: Minus Breeding, MD;  Location: [facility] CATH LAB;  Service: Cardiovascular;  Laterality: N/A;  . Total knee arthroplasty Left 09/21/2014    Procedure: TOTAL LEFT KNEE ARTHROPLASTY;  Surgeon: [name], MD;  Location: [place];  Service: Orthopedics;  Laterality: Left;    ROS:    As stated in the HPI  and negative for all other systems.  PHYSICAL EXAM BP 122/74 mmHg  Pulse 75  Ht 5\' 2"  (1.575 m)  Wt 162 lb (73.483 kg)  BMI 29.62 kg/m2 GENERAL:  Well appearing NECK:  No jugular venous distention, waveform within normal limits, carotid upstroke brisk and symmetric, no bruits, no thyromegaly LYMPHATICS:  No cervical, inguinal adenopathy LUNGS:  Clear to auscultation bilaterally BACK:  No CVA tenderness CHEST:  Well healed sternotomy scar. HEART:  PMI not displaced or sustained,S1 and S2 within normal limits, no S3, no S4, no clicks, no rubs, no murmurs ABD:  Flat, positive bowel sounds normal in frequency in pitch, no bruits, no rebound, no guarding, no midline pulsatile mass, no hepatomegaly, no splenomegaly EXT:  2 plus pulses throughout, no edema, no cyanosis no clubbing  EKG:  Sinus rhythm, rate 75, axis within normal limits, intervals within normal limits, old anteroseptal infarct, no acute ST-T wave changes.  ASSESSMENT AND PLAN  CAD:  The patient has no new sypmtoms.  No further cardiovascular testing is indicated.  We will continue with aggressive risk reduction and meds as listed.  DYSLIPIDEMIA:  She was followed in the [facility] study but this has ended.  I will check a lipid profile. I suspect she will need PCSK9.  We will see if she is eligible for another trial or if he can get her in the lipid clinic to start this at the trial is not available area  CARDIOMYOPATHY:  Her EF was normal last year on follow up echo.  No further work up is suggested.   CAROTID STENOSIS.  This was trace and no follow up is scheduled.

## 2015-06-30 ENCOUNTER — Ambulatory Visit (INDEPENDENT_AMBULATORY_CARE_PROVIDER_SITE_OTHER): Payer: Medicare Other | Admitting: Cardiology

## 2015-06-30 ENCOUNTER — Encounter: Payer: Self-pay | Admitting: Cardiology

## 2015-06-30 VITALS — BP 122/74 | HR 75 | Ht 62.0 in | Wt 162.0 lb

## 2015-06-30 DIAGNOSIS — I10 Essential (primary) hypertension: Secondary | ICD-10-CM | POA: Diagnosis not present

## 2015-06-30 DIAGNOSIS — E785 Hyperlipidemia, unspecified: Secondary | ICD-10-CM | POA: Diagnosis not present

## 2015-06-30 DIAGNOSIS — I2510 Atherosclerotic heart disease of native coronary artery without angina pectoris: Secondary | ICD-10-CM

## 2015-06-30 MED ORDER — EZETIMIBE 10 MG PO TABS: 10.0000 mg | ORAL_TABLET | Freq: Every day | ORAL | Status: DC

## 2015-06-30 MED ORDER — LISINOPRIL 5 MG PO TABS: 5.0000 mg | ORAL_TABLET | Freq: Two times a day (BID) | ORAL | Status: DC

## 2015-06-30 MED ORDER — METOPROLOL TARTRATE 50 MG PO TABS: 50.0000 mg | ORAL_TABLET | Freq: Two times a day (BID) | ORAL | Status: DC

## 2015-06-30 NOTE — Patient Instructions (Signed)
Medication Instructions:  Continue current medications  Labwork: Fasting Lipids  Testing/Procedures: NONE  Follow-Up: Your physician wants you to follow-up in: 1 Year. You will receive a reminder letter in the mail two months in advance. If you don't receive a letter, please call our office to schedule the follow-up appointment.   Any Other Special Instructions Will Be Listed Below (If Applicable).   If you need a refill on your cardiac medications before your next appointment, please call your pharmacy.

## 2015-07-27 ENCOUNTER — Encounter: Payer: Self-pay | Admitting: Cardiology

## 2015-07-27 DIAGNOSIS — Z006 Encounter for examination for normal comparison and control in clinical research program: Secondary | ICD-10-CM

## 2015-08-02 ENCOUNTER — Encounter: Payer: Self-pay | Admitting: *Deleted

## 2015-08-02 DIAGNOSIS — Z006 Encounter for examination for normal comparison and control in clinical research program: Secondary | ICD-10-CM

## 2015-08-02 NOTE — Progress Notes (Signed)
Late entry:     Subject met inclusion and exclusion criteria. The informed consent form, study requirements and expectations were reviewed with the subject and questions and concerns were addressed prior to the signing of the consent form. The subject verbalized understanding of the trail requirements. The subject agreed to participate in the CLEAR trial and signed the informed consent. The informed consent was obtained prior to performance of any protocol-specific procedures for the subject. A copy of the signed informed consent was given to the subject and a copy was placed in the subject's medical record.        Jake Bathe, RN July 27, 2015 [id number]

## 2015-08-09 ENCOUNTER — Encounter: Payer: Self-pay | Admitting: Cardiology

## 2015-08-09 ENCOUNTER — Encounter: Payer: Self-pay | Admitting: *Deleted

## 2015-08-09 DIAGNOSIS — Z006 Encounter for examination for normal comparison and control in clinical research program: Secondary | ICD-10-CM

## 2015-08-09 NOTE — Progress Notes (Signed)
CLEAR Visit S2 completed. Physical completed by Dr. Lia Foyer. Patients inclusion and exclusion reviewed. Labs reviewed potasium 5.8 from S1 Visit. Research study required labs and local BMP drawn for potassium. Dispensed bottle Number [id number] with instructions. Next research appointment schedule. Questions encouraged and answered. I informed patient I will notify her with potassium results.

## 2015-08-10 ENCOUNTER — Telehealth: Payer: Self-pay | Admitting: *Deleted

## 2015-08-10 NOTE — Telephone Encounter (Signed)
Potassium results 5.4 given to patient with instructions that its borderline high but okay & to make sure she is hydrated well. I also told her I would copy epic note to Dr. Percival Spanish and his nurse and if further instruction were needed she may would hear from them. Questions encouraged and answered, patient verbalized understanding.

## 2015-08-15 ENCOUNTER — Other Ambulatory Visit: Payer: Self-pay | Admitting: *Deleted

## 2015-08-15 MED ORDER — AMBULATORY NON FORMULARY MEDICATION: 180.0000 mg | Freq: Every day | Status: DC

## 2015-09-12 ENCOUNTER — Encounter: Payer: Self-pay | Admitting: Cardiology

## 2015-11-23 ENCOUNTER — Telehealth: Payer: Self-pay | Admitting: *Deleted

## 2015-11-23 DIAGNOSIS — Z79899 Other long term (current) drug therapy: Secondary | ICD-10-CM | POA: Diagnosis not present

## 2015-11-23 NOTE — Telephone Encounter (Signed)
Pt aware of her blood work, BMP ordered for pt to get done

## 2015-11-23 NOTE — Telephone Encounter (Signed)
-----   Message from Minus Breeding, MD sent at 11/22/2015  5:00 PM EST ----- Potassium was slightly high.  Please repeat a BMET.

## 2015-11-24 LAB — BASIC METABOLIC PANEL
BUN: 19 mg/dL (ref 7–25)
CALCIUM: 10.2 mg/dL (ref 8.6–10.4)
CO2: 29 mmol/L (ref 20–31)
Chloride: 101 mmol/L (ref 98–110)
Creat: 0.77 mg/dL (ref 0.60–0.93)
GLUCOSE: 83 mg/dL (ref 65–99)
Potassium: 5.4 mmol/L — ABNORMAL HIGH (ref 3.5–5.3)
Sodium: 139 mmol/L (ref 135–146)

## 2015-11-28 ENCOUNTER — Telehealth: Payer: Self-pay | Admitting: *Deleted

## 2015-11-28 NOTE — Telephone Encounter (Signed)
Pt is aware of her lab work, I told pt se will need to recheck a BMP in 1 week, pt stated she is going to have blood work done next Tuesday at Dr Lia Foyer office and she will have him draw a BMP

## 2015-11-28 NOTE — Telephone Encounter (Signed)
-----   Message from Minus Breeding, MD sent at 11/27/2015  9:45 AM EST ----- Please ask her to reduce the potassium containing foods if she is consuming and repeat again in one week.  Call Ms. Swartout with the results and send results to [facility], MD

## 2015-12-05 ENCOUNTER — Other Ambulatory Visit: Payer: Self-pay | Admitting: *Deleted

## 2015-12-05 DIAGNOSIS — Z79899 Other long term (current) drug therapy: Secondary | ICD-10-CM

## 2015-12-05 LAB — BASIC METABOLIC PANEL
BUN: 27 mg/dL — AB (ref 7–25)
CALCIUM: 10.1 mg/dL (ref 8.6–10.4)
CO2: 25 mmol/L (ref 20–31)
CREATININE: 1 mg/dL — AB (ref 0.60–0.93)
Chloride: 102 mmol/L (ref 98–110)
GLUCOSE: 84 mg/dL (ref 65–99)
Potassium: 5.1 mmol/L (ref 3.5–5.3)
Sodium: 139 mmol/L (ref 135–146)

## 2015-12-05 NOTE — Progress Notes (Unsigned)
Patient presented to [facility] for her repeat BMET today.

## 2015-12-14 ENCOUNTER — Encounter: Payer: Self-pay | Admitting: *Deleted

## 2015-12-14 ENCOUNTER — Telehealth: Payer: Self-pay | Admitting: Cardiology

## 2015-12-14 DIAGNOSIS — Z006 Encounter for examination for normal comparison and control in clinical research program: Secondary | ICD-10-CM

## 2015-12-14 DIAGNOSIS — E875 Hyperkalemia: Secondary | ICD-10-CM | POA: Insufficient documentation

## 2015-12-14 HISTORY — DX: Hyperkalemia: E87.5

## 2015-12-14 NOTE — Telephone Encounter (Signed)
New message  Susan Welch is with a research study group that the pt is part of  He needs to discuss the pt's potasium of 6.0 with Nya  Please call back, will be there until 5

## 2015-12-14 NOTE — Progress Notes (Addendum)
Patient was in Research clinic on 12/12/2015 for follow up in the [place].  She stated that she had been going to Dr. Rosezella [place] office for BMETs due to an increased Potassium level and requested that I call her with BMET results from this visit.  Received the results today and the Potassium level from our Central lab, [facility], is 6.0.  Spoke with Dr. Lia Foyer who advised calling Dr. Rosezella [place] office and speak with his nurse to report findings. Reached [facility] office, spoke with Jodie at the scheduling desk who attempted to direct my call to [facility].  Unable to connect me and I gave my office phone number for her to call me back.  Gave Jodie patients name, birth date and Potassium results. Have contacted the patient with the results.  Called [phone] to reach triage nurse to report high Potassium level 6.0. Spoke with Ermalinda Barrios who is going to help me get connected with [facility] office. Received call from [place] at [place] office.  Gave creatinine level of 1.00.  Receiving orders from Dr. Ellyn Hack to 1. Liberalize fluids 2. Hold lisinopril tonight and tomorrow 3. Go to [facility] 12/15/2015 for a repeat BMET.  Spoke with patient and explained Dr. Allison Quarry orders to her.  She verbalized understanding and was very appreciative of our help.

## 2015-12-14 NOTE — Telephone Encounter (Signed)
Spoke with DOD-Harding he states to hold lisinopril today and tomorrow liberalize fluids redraw BMET tomorrow.  Camila Li notified he will call pt and relay orders  Stat BMET ordered-per DOD

## 2015-12-15 LAB — BASIC METABOLIC PANEL
BUN: 22 mg/dL (ref 7–25)
CALCIUM: 9.7 mg/dL (ref 8.6–10.4)
CO2: 26 mmol/L (ref 20–31)
Chloride: 103 mmol/L (ref 98–110)
Creat: 0.84 mg/dL (ref 0.60–0.93)
GLUCOSE: 89 mg/dL (ref 65–99)
POTASSIUM: 4.8 mmol/L (ref 3.5–5.3)
SODIUM: 141 mmol/L (ref 135–146)

## 2016-05-14 DIAGNOSIS — H524 Presbyopia: Secondary | ICD-10-CM | POA: Diagnosis not present

## 2016-05-15 ENCOUNTER — Ambulatory Visit (INDEPENDENT_AMBULATORY_CARE_PROVIDER_SITE_OTHER): Payer: Medicare Other | Admitting: Physician Assistant

## 2016-05-15 ENCOUNTER — Encounter: Payer: Self-pay | Admitting: Physician Assistant

## 2016-05-15 VITALS — BP 188/79 | HR 57 | Temp 98.2°F | Resp 18 | Ht 62.0 in | Wt 162.0 lb

## 2016-05-15 DIAGNOSIS — R05 Cough: Secondary | ICD-10-CM

## 2016-05-15 DIAGNOSIS — R0981 Nasal congestion: Secondary | ICD-10-CM | POA: Diagnosis not present

## 2016-05-15 DIAGNOSIS — R030 Elevated blood-pressure reading, without diagnosis of hypertension: Secondary | ICD-10-CM

## 2016-05-15 DIAGNOSIS — J209 Acute bronchitis, unspecified: Secondary | ICD-10-CM | POA: Diagnosis not present

## 2016-05-15 DIAGNOSIS — R059 Cough, unspecified: Secondary | ICD-10-CM

## 2016-05-15 MED ORDER — MUCINEX DM MAXIMUM STRENGTH 60-1200 MG PO TB12: 1.0000 | ORAL_TABLET | Freq: Two times a day (BID) | ORAL | 1 refills | Status: DC

## 2016-05-15 MED ORDER — AZITHROMYCIN 250 MG PO TABS: ORAL_TABLET | ORAL | 0 refills | Status: DC

## 2016-05-15 MED ORDER — BENZONATATE 100 MG PO CAPS: 100.0000 mg | ORAL_CAPSULE | Freq: Three times a day (TID) | ORAL | 0 refills | Status: DC | PRN

## 2016-05-15 NOTE — Progress Notes (Signed)
Susan Welch  MRN: [id number] DOB: November 12, 1945  PCP: Wardell Honour, MD  Subjective:  Pt is a 71 year old female PMH HTN, HLD, who presents to clinic for cough x 6 days.  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 She felt a tickle in her throat last week with a cough. Last night her cough got much worse "it feels like it is going into my chest". Cough is now productive. She endorses some runny nose. She is sleeping well.  She has tried Robitussin DM and halls cough drop.  Denies fever, chills, sneezing, night sweats, muscle aches, chst pain, wheezing, n/v/d.  Review of Systems  Constitutional: Negative for chills, diaphoresis, fatigue and fever.  HENT: Positive for congestion and rhinorrhea. Negative for postnasal drip, sinus pressure, sneezing and sore throat.   Respiratory: Positive for cough. Negative for chest tightness, shortness of breath and wheezing.   Cardiovascular: Negative for chest pain and palpitations.  Gastrointestinal: Negative for abdominal pain, diarrhea, nausea and vomiting.  Neurological: Negative for weakness, light-headedness and headaches.    Patient Active Problem List   Diagnosis Date Noted  . Serum potassium elevated 12/14/2015  . DJD (degenerative joint disease) of knee 09/21/2014  . Ischemic cardiomyopathy   . S/P CABG x 4 08/20/2012  . Knee joint replacement by other means 07/26/2012  . Osteoarthritis of right knee 07/16/2012  . HTN (hypertension) 09/20/2011  . Lipid disorder 09/20/2011    Current Outpatient Prescriptions on File Prior to Visit  Medication Sig Dispense Refill  . AMBULATORY NON FORMULARY MEDICATION Take 180 mg by mouth daily. Medication Name: Bempedoic acid 180 mgs vs placebo, CLEAR research study, drug provided    . aspirin 81 MG tablet Take 81 mg by mouth daily.    . calcium citrate-vitamin D (CITRACAL+D) 315-200 MG-UNIT per tablet Take 1 tablet by mouth daily.    . Cholecalciferol (VITAMIN D) 2000 UNITS tablet Take 1 tablet  (2,000 Units total) by mouth daily. 30 tablet 0  . Coenzyme Q10 (CO Q10) 200 MG CAPS Take 200 mg by mouth daily. 30 capsule 0  . ezetimibe (ZETIA) 10 MG tablet Take 1 tablet (10 mg total) by mouth daily. 30 tablet 11  . folic acid ([employer]S FOLIC ACID) 270 MCG tablet Take 400 mcg by mouth daily.    Marland Kitchen lisinopril (PRINIVIL,ZESTRIL) 5 MG tablet Take 1 tablet (5 mg total) by mouth 2 (two) times daily. 60 tablet 11  . metoprolol (LOPRESSOR) 50 MG tablet Take 1 tablet (50 mg total) by mouth 2 (two) times daily. 60 tablet 11   No current facility-administered medications on file prior to visit.     Allergies  Allergen Reactions  . Oxycodone Other (See Comments)    Flu like symptoms.  . Statins Other (See Comments)    Joint pain with Lipitor daily, Pravastatin 10 mg qd and Pravastatin 10 mg M/W/F, Crestor 10 mg once weekly also caused joint aches  . Nickel Rash     Objective:  BP (!) 188/79   Pulse (!) 57   Temp 98.2 F (36.8 C) (Oral)   Resp 18   Ht 5\' 2"  (1.575 m)   Wt 162 lb (73.5 kg)   SpO2 97%   BMI 29.63 kg/m   Physical Exam  Constitutional: She is oriented to person, place, and time and well-developed, well-nourished, and in no distress. No distress.  HENT:  Right Ear: Tympanic membrane normal.  Left Ear: Tympanic membrane normal.  Mouth/Throat: Oropharynx is  clear and moist and mucous membranes are normal.  Cardiovascular: Normal rate, regular rhythm and normal heart sounds.   Pulmonary/Chest: Effort normal and breath sounds normal. No respiratory distress.  Neurological: She is alert and oriented to person, place, and time. GCS score is 15.  Skin: Skin is warm and dry.  Psychiatric: Mood, memory, affect and judgment normal.  Vitals reviewed.   Assessment and Plan :  1. Acute bronchitis, unspecified organism 2. Nasal congestion 3. Cough - azithromycin (ZITHROMAX) 250 MG tablet; Take 2 tabs PO x 1 dose, then 1 tab PO QD x 4 days  Dispense: 6 tablet; Refill: 0 -  Dextromethorphan-Guaifenesin (MUCINEX DM MAXIMUM STRENGTH) 60-1200 MG TB12; Take 1 tablet by mouth every 12 (twelve) hours.  Dispense: 14 each; Refill: 1 - benzonatate (TESSALON) 100 MG capsule; Take 1-2 capsules (100-200 mg total) by mouth 3 (three) times daily as needed for cough.  Dispense: 40 capsule; Refill: 0 - Push fluids and rest. RTC in 5-7 days if no improvement. Consider chest x-ray at that time.  4. Elevated blood pressure reading - Recheck vitals   Mercer Pod, PA-C  [facility] 05/15/2016 8:41 AM

## 2016-05-15 NOTE — Patient Instructions (Addendum)
Please stay well hydrated -drink 1-2 liters of water a day.  Come back in 5-7 days if you are not better.   Thank you for coming in today. I hope you feel we met your needs.  Feel free to call [facility] if you have any questions or further requests.  Please consider signing up for MyChart if you do not already have it, as this is a great way to communicate with me.  Best,  Whitney McVey, PA-C   IF you received an x-ray today, you will receive an invoice from [facility]. Please contact [facility] at [phone] with questions or concerns regarding your invoice.   IF you received labwork today, you will receive an invoice from [facility]. Please contact [facility] at [phone] with questions or concerns regarding your invoice.   Our billing staff will not be able to assist you with questions regarding bills from these companies.  You will be contacted with the lab results as soon as they are available. The fastest way to get your results is to activate your My Chart account. Instructions are located on the last page of this paperwork. If you have not heard from [place] regarding the results in 2 weeks, please contact this office.

## 2016-05-17 ENCOUNTER — Telehealth: Payer: Self-pay | Admitting: Cardiology

## 2016-05-24 NOTE — Telephone Encounter (Signed)
Close encounter 

## 2016-06-03 ENCOUNTER — Other Ambulatory Visit: Payer: Self-pay | Admitting: Cardiology

## 2016-06-16 NOTE — Progress Notes (Signed)
HPI [name] returns for followup after bypass. She was admitted for total knee replacement and had a V. Fib arrest and was found to have three-vessel disease as described. She had a mildly reduced ejection fraction that was back to normal on follow up.  She had the other knee replaced. She has done very well.  She did have a fall in [place].  This was not syncope and she did not injure herself.  She does some exercising.  The patient denies any new symptoms such as chest discomfort, neck or arm discomfort. There has been no new shortness of breath, PND or orthopnea. There have been no reported palpitations, presyncope or syncope.   Allergies  Allergen Reactions  . Oxycodone Other (See Comments)    Flu like symptoms.  . Statins Other (See Comments)    Joint pain with Lipitor daily, Pravastatin 10 mg qd and Pravastatin 10 mg M/W/F, Crestor 10 mg once weekly also caused joint aches  . Nickel Rash    Current Outpatient Prescriptions  Medication Sig Dispense Refill  . AMBULATORY NON FORMULARY MEDICATION Take 180 mg by mouth daily. Medication Name: Bempedoic acid 180 mgs vs placebo, CLEAR research study, drug provided    . aspirin 81 MG tablet Take 81 mg by mouth daily.    . calcium citrate-vitamin D (CITRACAL+D) 315-200 MG-UNIT per tablet Take 1 tablet by mouth daily.    . Cholecalciferol (VITAMIN D) 2000 UNITS tablet Take 1 tablet (2,000 Units total) by mouth daily. 30 tablet 0  . Coenzyme Q10 (CO Q10) 200 MG CAPS Take 200 mg by mouth daily. 30 capsule 0  . ezetimibe (ZETIA) 10 MG tablet Take 1 tablet (10 mg total) by mouth daily. 30 tablet 11  . folic acid ([employer]S FOLIC ACID) 998 MCG tablet Take 400 mcg by mouth daily.    Marland Kitchen lisinopril (PRINIVIL,ZESTRIL) 10 MG tablet Take 1 tablet (10 mg total) by mouth 2 (two) times daily. 180 tablet 3  . metoprolol tartrate (LOPRESSOR) 50 MG tablet TAKE ONE TABLET BY MOUTH TWICE A DAY 60 tablet 0   No current facility-administered medications for this  visit.     Past Medical History:  Diagnosis Date  . Anemia   . Anxiety    Hx: of situational anxiety  . Arthritis    a. bilat knees s/p R TKA 07/15/2012  . Carotid stenosis    Pre-CABG Dopplers 07/19/12: Right 40-59%.  . Coronary artery disease    a. s/p VF arrest during TKR 7/14 2/2 NSTEMI => LHC with 3v CAD =>  s/p CABG 07/21/12 (LIMA-LAD, SVG-RI and OM1, SVG-distal RCA).   Marland Kitchen DVT (deep venous thrombosis) ([place]) 07/2012  . GERD (gastroesophageal reflux disease)   . Headache(784.0)    Hx: of migraines  . Hyperlipidemia   . Hypertension   . Ischemic cardiomyopathy    Echo 07/19/12: EF 40-45%, basal to mid anteroseptal AK, distal anteroseptal HK, mild MR.   . Panic attacks   . Pneumonia   . Ventricular fibrillation ([place])    a. 07/15/2012 in setting of R TKA  . Wears glasses     Past Surgical History:  Procedure Laterality Date  . ABDOMINAL HYSTERECTOMY    . CORONARY ARTERY BYPASS GRAFT N/A 07/21/2012   Procedure: CORONARY ARTERY BYPASS GRAFTING (CABG);  Surgeon: Grace Isaac, MD;  Location: [place];  Service: Open Heart Surgery;  Laterality: N/A;  Times 4 using left internal mammary artery and endoscopically harvested left saphenous vein.  Marland Kitchen  DILATION AND CURETTAGE OF UTERUS    . INTRAOPERATIVE TRANSESOPHAGEAL ECHOCARDIOGRAM N/A 07/21/2012   Procedure: INTRAOPERATIVE TRANSESOPHAGEAL ECHOCARDIOGRAM;  Surgeon: Grace Isaac, MD;  Location: [place];  Service: Open Heart Surgery;  Laterality: N/A;  . LEFT HEART CATHETERIZATION WITH CORONARY ANGIOGRAM N/A 07/17/2012   Procedure: LEFT HEART CATHETERIZATION WITH CORONARY ANGIOGRAM;  Surgeon: Minus Breeding, MD;  Location: [facility] CATH LAB;  Service: Cardiovascular;  Laterality: N/A;  . TOTAL KNEE ARTHROPLASTY Right 07/15/2012   Procedure: TOTAL KNEE ARTHROPLASTY;  Surgeon: [name], MD;  Location: [place];  Service: Orthopedics;  Laterality: Right;  drapes pulled back to provide cpr, new drape applied, protocol followed  . TOTAL KNEE  ARTHROPLASTY Left 09/21/2014   Procedure: TOTAL LEFT KNEE ARTHROPLASTY;  Surgeon: [name], MD;  Location: [place];  Service: Orthopedics;  Laterality: Left;    ROS:    As stated in the HPI and negative for all other systems. PHYSICAL EXAM BP (!) 146/78   Pulse 60   Ht 5\' 2"  (1.575 m)   Wt 163 lb 3.2 oz (74 kg)   BMI 29.85 kg/m   GENERAL:  Well appearing NECK:  No jugular venous distention, waveform within normal limits, carotid upstroke brisk and symmetric, no bruits, no thyromegaly LUNGS:  Clear to auscultation bilaterally CHEST:  Unremarkable HEART:  PMI not displaced or sustained,S1 and S2 within normal limits, no S3, no S4, no clicks, no rubs, no murmurs ABD:  Flat, positive bowel sounds normal in frequency in pitch, no bruits, no rebound, no guarding, no midline pulsatile mass, no hepatomegaly, no splenomegaly EXT:  2 plus pulses throughout, no edema, no cyanosis no clubbing    EKG:  Sinus rhythm, rate 60, axis within normal limits, intervals within normal limits, old anteroseptal infarct, no acute ST-T wave changes.  No change from 06/30/15    ASSESSMENT AND PLAN  CAD:  The patient has no new sypmtoms.  No further cardiovascular testing is indicated.  We will continue with aggressive risk reduction and meds as listed.   HTN:  Her BP is not well controlled.  I will increase the lisinopril to 10 mg bid and check a BMET in two weeks.   DYSLIPIDEMIA:  She is followed in the CLEAR trial.  She is truly intolerant of all statins even at low doses.  I explored this with her again today.  She will continue the Zetia.  CARDIOMYOPATHY:  Her EF was normal in 2016.  Med changes as above.   CAROTID STENOSIS.   This was trace.  No change in therapy is planned.

## 2016-06-17 ENCOUNTER — Encounter: Payer: Self-pay | Admitting: Cardiology

## 2016-06-17 ENCOUNTER — Ambulatory Visit (INDEPENDENT_AMBULATORY_CARE_PROVIDER_SITE_OTHER): Payer: Medicare Other | Admitting: Cardiology

## 2016-06-17 VITALS — BP 146/78 | HR 60 | Ht 62.0 in | Wt 163.2 lb

## 2016-06-17 DIAGNOSIS — I2510 Atherosclerotic heart disease of native coronary artery without angina pectoris: Secondary | ICD-10-CM | POA: Insufficient documentation

## 2016-06-17 DIAGNOSIS — E785 Hyperlipidemia, unspecified: Secondary | ICD-10-CM

## 2016-06-17 DIAGNOSIS — Z79899 Other long term (current) drug therapy: Secondary | ICD-10-CM

## 2016-06-17 DIAGNOSIS — I10 Essential (primary) hypertension: Secondary | ICD-10-CM | POA: Diagnosis not present

## 2016-06-17 HISTORY — DX: Atherosclerotic heart disease of native coronary artery without angina pectoris: I25.10

## 2016-06-17 HISTORY — DX: Other long term (current) drug therapy: Z79.899

## 2016-06-17 MED ORDER — LISINOPRIL 10 MG PO TABS: 10.0000 mg | ORAL_TABLET | Freq: Two times a day (BID) | ORAL | 3 refills | Status: DC

## 2016-06-17 NOTE — Patient Instructions (Signed)
Medication Instructions:  INCREASE- Lisinopril 10 mg twice a day  Labwork: BMP in 2 weeks  Testing/Procedures: None Ordered  Follow-Up: Your physician wants you to follow-up in: 1 Year. You will receive a reminder letter in the mail two months in advance. If you don't receive a letter, please call our office to schedule the follow-up appointment.   Any Other Special Instructions Will Be Listed Below (If Applicable).   If you need a refill on your cardiac medications before your next appointment, please call your pharmacy.

## 2016-06-19 NOTE — Addendum Note (Signed)
Addended by: Milderd Meager on: 06/19/2016 01:51 PM   Modules accepted: Orders

## 2016-07-03 ENCOUNTER — Other Ambulatory Visit: Payer: Self-pay | Admitting: Cardiology

## 2016-07-08 DIAGNOSIS — Z79899 Other long term (current) drug therapy: Secondary | ICD-10-CM | POA: Diagnosis not present

## 2016-07-08 LAB — BASIC METABOLIC PANEL
BUN / CREAT RATIO: 23 (ref 12–28)
BUN: 20 mg/dL (ref 8–27)
CO2: 22 mmol/L (ref 20–29)
CREATININE: 0.87 mg/dL (ref 0.57–1.00)
Calcium: 10.2 mg/dL (ref 8.7–10.3)
Chloride: 99 mmol/L (ref 96–106)
GFR calc Af Amer: 78 mL/min/{1.73_m2} (ref >59)
GFR, EST NON AFRICAN AMERICAN: 68 mL/min/{1.73_m2} (ref >59)
GLUCOSE: 84 mg/dL (ref 65–99)
Potassium: 5.5 mmol/L — ABNORMAL HIGH (ref 3.5–5.2)
SODIUM: 139 mmol/L (ref 134–144)

## 2016-07-09 ENCOUNTER — Telehealth: Payer: Self-pay | Admitting: *Deleted

## 2016-07-09 DIAGNOSIS — E875 Hyperkalemia: Secondary | ICD-10-CM

## 2016-07-09 NOTE — Telephone Encounter (Signed)
-----   Message from Minus Breeding, MD sent at 07/09/2016 12:14 PM EDT ----- Her potassium was elevated.  Can we have this repeated on Friday.   Call Ms. Pernice with the results and send results to Wardell Honour, MD

## 2016-07-09 NOTE — Telephone Encounter (Signed)
Spoke with pt, aware of lab results. She has been eating a lot of tomatoes and avocados. She will return for labs on Friday. Orders placed.

## 2016-07-12 ENCOUNTER — Other Ambulatory Visit: Payer: Self-pay | Admitting: *Deleted

## 2016-07-12 DIAGNOSIS — E875 Hyperkalemia: Secondary | ICD-10-CM | POA: Diagnosis not present

## 2016-07-12 LAB — BASIC METABOLIC PANEL
BUN / CREAT RATIO: 32 — AB (ref 12–28)
BUN: 24 mg/dL (ref 8–27)
CHLORIDE: 101 mmol/L (ref 96–106)
CO2: 23 mmol/L (ref 20–29)
Calcium: 9.9 mg/dL (ref 8.7–10.3)
Creatinine, Ser: 0.76 mg/dL (ref 0.57–1.00)
GFR, EST AFRICAN AMERICAN: 92 mL/min/{1.73_m2} (ref >59)
GFR, EST NON AFRICAN AMERICAN: 80 mL/min/{1.73_m2} (ref >59)
Glucose: 93 mg/dL (ref 65–99)
Potassium: 5.5 mmol/L — ABNORMAL HIGH (ref 3.5–5.2)
Sodium: 142 mmol/L (ref 134–144)

## 2016-07-16 ENCOUNTER — Telehealth: Payer: Self-pay | Admitting: *Deleted

## 2016-07-16 DIAGNOSIS — Z79899 Other long term (current) drug therapy: Secondary | ICD-10-CM

## 2016-07-16 MED ORDER — AMLODIPINE BESYLATE 2.5 MG PO TABS: 2.5000 mg | ORAL_TABLET | Freq: Every day | ORAL | 3 refills | Status: DC

## 2016-07-16 NOTE — Telephone Encounter (Signed)
Pt is aware of her blood work, advised pt to decrease Lisinopril to once daily and START Amlodipine 2.5 mg daily, medication send to pt [place], BMP ordered for pt to get done in 1 week.Marland Kitchen

## 2016-07-16 NOTE — Telephone Encounter (Signed)
-----   Message from Minus Breeding, MD sent at 07/13/2016  9:40 AM EDT ----- Her potassium is still up.  Have her reduce the potassium containing foods in her diet.  She will need to reduce the Lisinopril back down to once daily.  She will need Norvasc 2.5 mg po once daily disp number 90 with 3 refills.  Check BMET in one week.

## 2016-07-22 ENCOUNTER — Other Ambulatory Visit: Payer: Self-pay | Admitting: Cardiology

## 2016-07-24 DIAGNOSIS — Z79899 Other long term (current) drug therapy: Secondary | ICD-10-CM | POA: Diagnosis not present

## 2016-07-24 LAB — BASIC METABOLIC PANEL
BUN/Creatinine Ratio: 26 (ref 12–28)
BUN: 21 mg/dL (ref 8–27)
CO2: 22 mmol/L (ref 20–29)
CREATININE: 0.81 mg/dL (ref 0.57–1.00)
Calcium: 10.1 mg/dL (ref 8.7–10.3)
Chloride: 99 mmol/L (ref 96–106)
GFR calc Af Amer: 85 mL/min/{1.73_m2} (ref >59)
GFR, EST NON AFRICAN AMERICAN: 74 mL/min/{1.73_m2} (ref >59)
Glucose: 101 mg/dL — ABNORMAL HIGH (ref 65–99)
Potassium: 5.1 mmol/L (ref 3.5–5.2)
SODIUM: 138 mmol/L (ref 134–144)

## 2016-07-26 ENCOUNTER — Encounter (INDEPENDENT_AMBULATORY_CARE_PROVIDER_SITE_OTHER): Payer: Medicare Other | Admitting: Ophthalmology

## 2016-07-26 DIAGNOSIS — D3132 Benign neoplasm of left choroid: Secondary | ICD-10-CM

## 2016-07-26 DIAGNOSIS — H35033 Hypertensive retinopathy, bilateral: Secondary | ICD-10-CM

## 2016-07-26 DIAGNOSIS — I10 Essential (primary) hypertension: Secondary | ICD-10-CM | POA: Diagnosis not present

## 2016-07-26 DIAGNOSIS — H2513 Age-related nuclear cataract, bilateral: Secondary | ICD-10-CM | POA: Diagnosis not present

## 2016-07-26 DIAGNOSIS — H43813 Vitreous degeneration, bilateral: Secondary | ICD-10-CM | POA: Diagnosis not present

## 2016-08-19 DIAGNOSIS — H35033 Hypertensive retinopathy, bilateral: Secondary | ICD-10-CM | POA: Diagnosis not present

## 2016-08-19 DIAGNOSIS — H25041 Posterior subcapsular polar age-related cataract, right eye: Secondary | ICD-10-CM | POA: Diagnosis not present

## 2016-08-19 DIAGNOSIS — H2513 Age-related nuclear cataract, bilateral: Secondary | ICD-10-CM | POA: Diagnosis not present

## 2016-08-19 DIAGNOSIS — H25013 Cortical age-related cataract, bilateral: Secondary | ICD-10-CM | POA: Diagnosis not present

## 2016-09-06 ENCOUNTER — Encounter: Payer: Self-pay | Admitting: *Deleted

## 2016-09-06 DIAGNOSIS — Z006 Encounter for examination for normal comparison and control in clinical research program: Secondary | ICD-10-CM

## 2016-09-06 NOTE — Progress Notes (Addendum)
Subject to [facility] for visit T6-M12 in the [place] study.  Stated that she did experience some higher potassium levels this summer and had been seeing Dr. Percival Spanish.  No other c/o, saes to report.  She will be having cataract surgery next week.  99% compliant with her meds, new meds administered, TC and Clinic visits scheduled.  Subject reconsented to [place] version 4.1 [id number] Local version [phone]

## 2016-09-07 IMAGING — CR DG KNEE 1-2V PORT*L*
2 series · 2 of 2 positions shown · non-contrast
Comparison: None.

CLINICAL DATA: Postop left total knee replacement

EXAM:
PORTABLE LEFT KNEE - 1-2 VIEW

[AP]
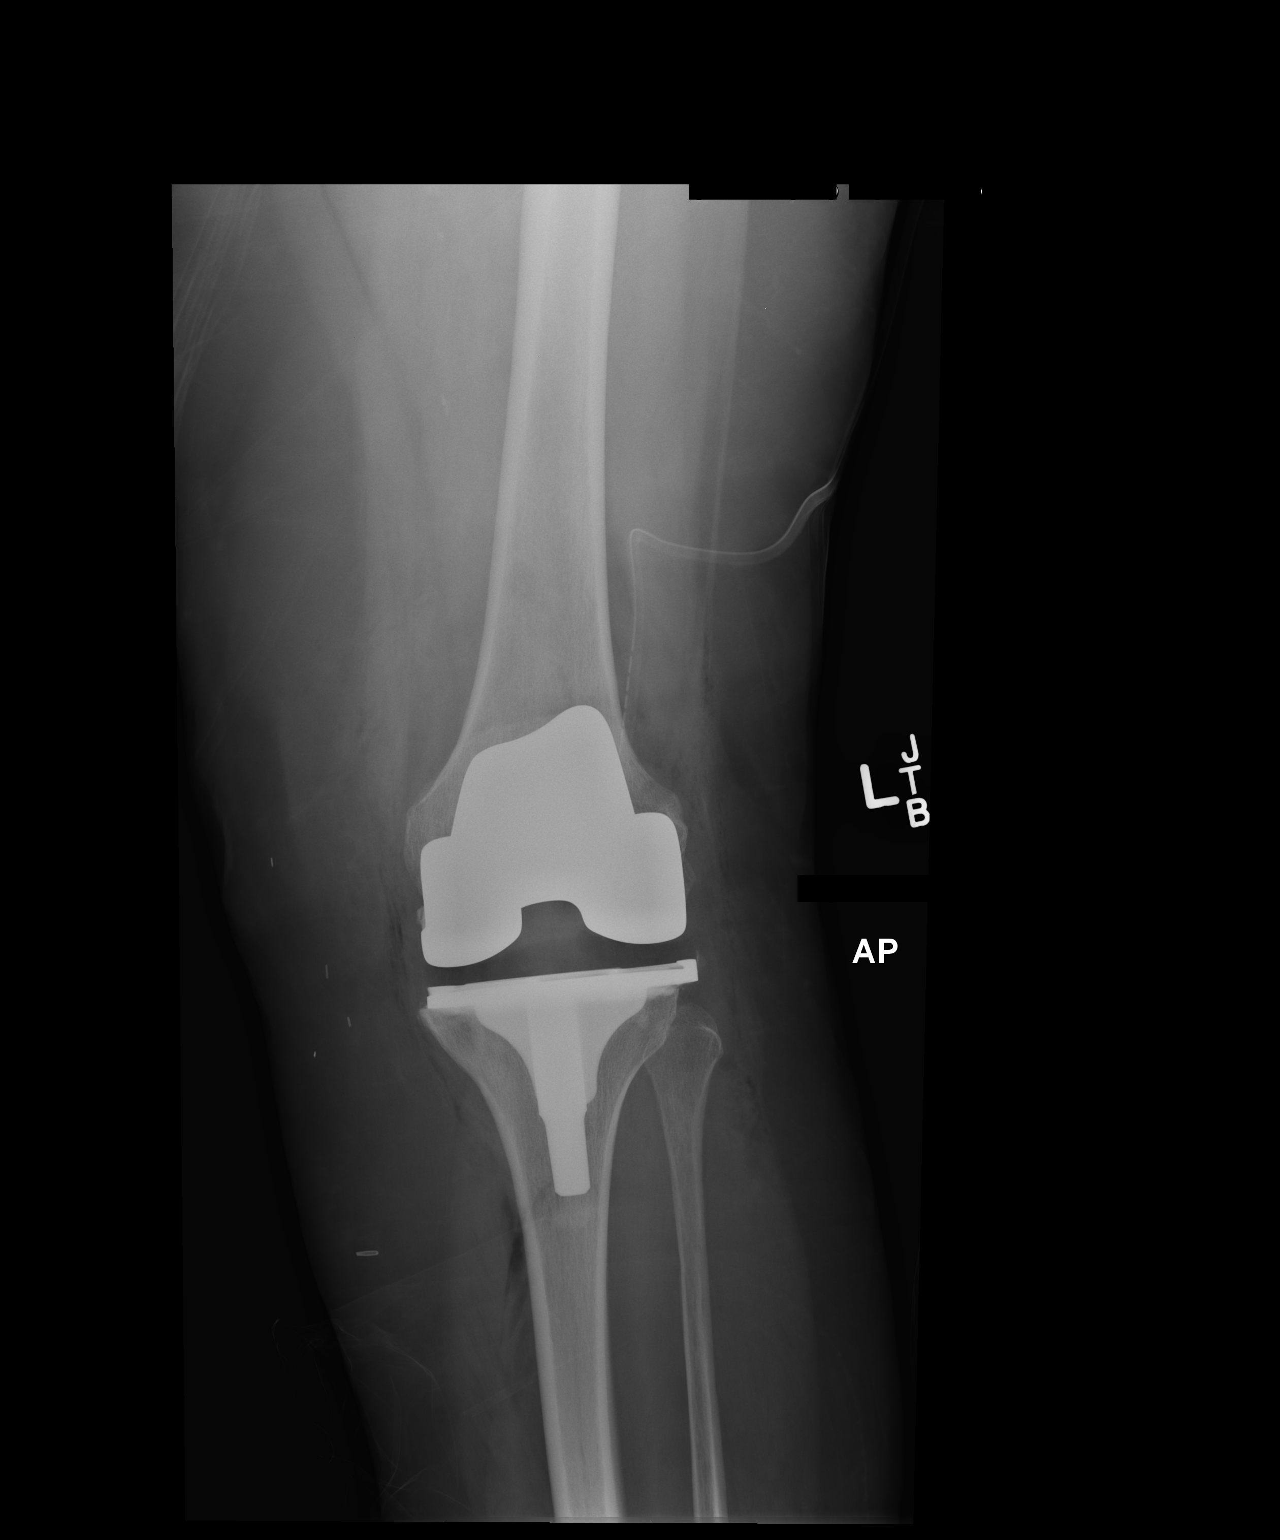

[xtable lateral]
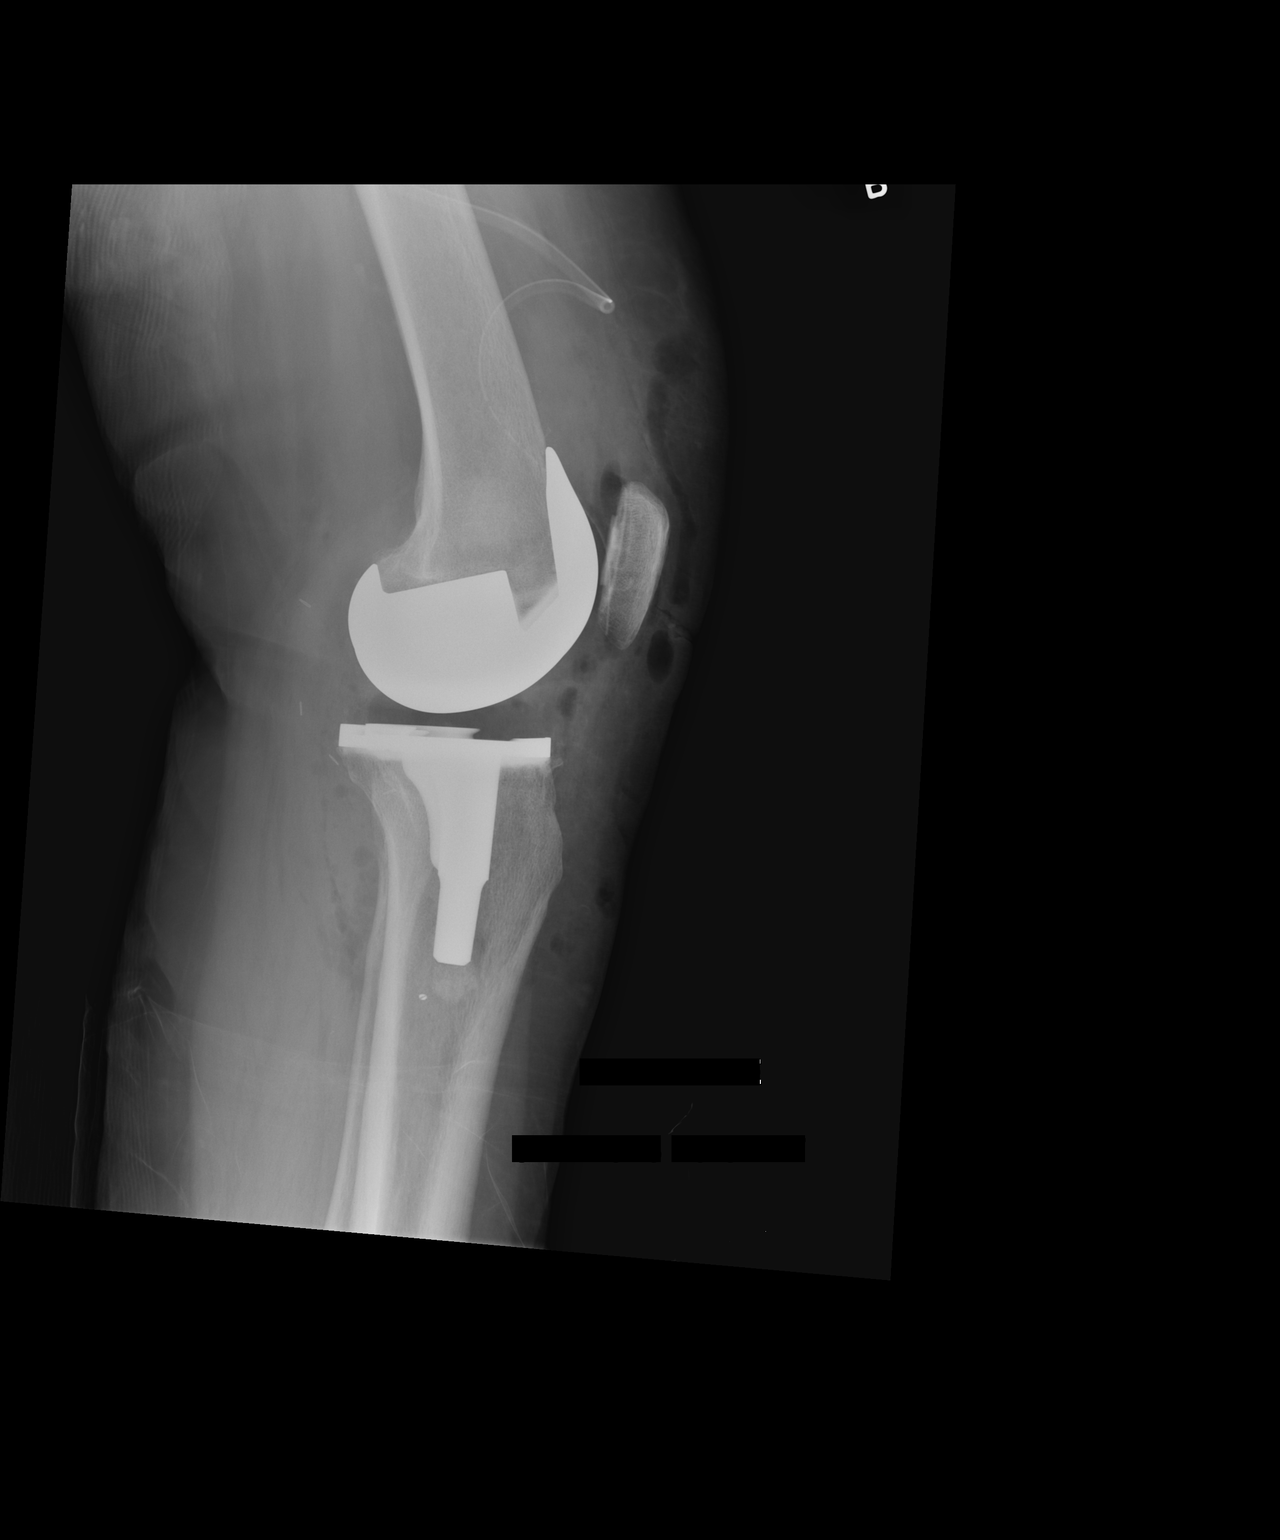

[2 of 2 positions shown; findings below may reference images not displayed]

FINDINGS: Left total knee arthroplasty.

Associated soft tissue gas and surgical drain.

No fracture or dislocation is seen.

Small suprapatellar knee joint effusion.
IMPRESSION: Left total knee arthroplasty in satisfactory position.

## 2016-09-10 DIAGNOSIS — H25811 Combined forms of age-related cataract, right eye: Secondary | ICD-10-CM | POA: Diagnosis not present

## 2016-09-10 DIAGNOSIS — H2511 Age-related nuclear cataract, right eye: Secondary | ICD-10-CM | POA: Diagnosis not present

## 2016-09-30 DIAGNOSIS — H2512 Age-related nuclear cataract, left eye: Secondary | ICD-10-CM | POA: Diagnosis not present

## 2016-09-30 DIAGNOSIS — H25012 Cortical age-related cataract, left eye: Secondary | ICD-10-CM | POA: Diagnosis not present

## 2016-10-08 DIAGNOSIS — H25812 Combined forms of age-related cataract, left eye: Secondary | ICD-10-CM | POA: Diagnosis not present

## 2016-10-08 DIAGNOSIS — H2512 Age-related nuclear cataract, left eye: Secondary | ICD-10-CM | POA: Diagnosis not present

## 2016-12-17 ENCOUNTER — Telehealth: Payer: Self-pay | Admitting: *Deleted

## 2016-12-17 ENCOUNTER — Encounter: Payer: Self-pay | Admitting: *Deleted

## 2016-12-17 NOTE — Telephone Encounter (Signed)
Spoke with subject re: visit T7-M15 for the Clear research study.  Subject reported having bilateral cataract surgeries. Right eye 09/10/2016, left eye 10-08-2016. Next appointment in clinic 03/12/2017 @0800 .

## 2017-02-02 NOTE — Telephone Encounter (Signed)
This encounter was created in error - please disregard.

## 2017-02-20 ENCOUNTER — Encounter: Payer: Self-pay | Admitting: Podiatry

## 2017-02-20 ENCOUNTER — Ambulatory Visit: Payer: Medicare Other | Admitting: Podiatry

## 2017-02-20 ENCOUNTER — Ambulatory Visit (INDEPENDENT_AMBULATORY_CARE_PROVIDER_SITE_OTHER): Payer: Medicare Other

## 2017-02-20 VITALS — BP 139/73 | HR 64 | Resp 16

## 2017-02-20 DIAGNOSIS — M778 Other enthesopathies, not elsewhere classified: Secondary | ICD-10-CM

## 2017-02-20 DIAGNOSIS — M7751 Other enthesopathy of right foot: Secondary | ICD-10-CM

## 2017-02-20 DIAGNOSIS — M205X1 Other deformities of toe(s) (acquired), right foot: Secondary | ICD-10-CM

## 2017-02-20 DIAGNOSIS — M779 Enthesopathy, unspecified: Secondary | ICD-10-CM

## 2017-02-20 MED ORDER — MELOXICAM 15 MG PO TABS: 15.0000 mg | ORAL_TABLET | Freq: Every day | ORAL | 3 refills | Status: DC

## 2017-02-20 NOTE — Progress Notes (Signed)
Subjective:  Patient ID: Susan Welch, female    DOB: 11-May-1945,  MRN: [id number] HPI Chief Complaint  Patient presents with  . Foot Pain    1st MPJ right - aching x 1 year, worsened recently, walking a lot aggravates, dress shoes uncomfortable, wearing sneakers and Tylenol PRN    72 y.o. female presents with the above complaint.     Past Medical History:  Diagnosis Date  . Anemia   . Anxiety    Hx: of situational anxiety  . Arthritis    a. bilat knees s/p R TKA 07/15/2012  . Carotid stenosis    Pre-CABG Dopplers 07/19/12: Right 40-59%.  . Coronary artery disease    a. s/p VF arrest during TKR 7/14 2/2 NSTEMI => LHC with 3v CAD =>  s/p CABG 07/21/12 (LIMA-LAD, SVG-RI and OM1, SVG-distal RCA).   Marland Kitchen DVT (deep venous thrombosis) ([place]) 07/2012  . GERD (gastroesophageal reflux disease)   . Headache(784.0)    Hx: of migraines  . Hyperlipidemia   . Hypertension   . Ischemic cardiomyopathy    Echo 07/19/12: EF 40-45%, basal to mid anteroseptal AK, distal anteroseptal HK, mild MR.   . Panic attacks   . Pneumonia   . Ventricular fibrillation ([place])    a. 07/15/2012 in setting of R TKA  . Wears glasses    Past Surgical History:  Procedure Laterality Date  . ABDOMINAL HYSTERECTOMY    . CORONARY ARTERY BYPASS GRAFT N/A 07/21/2012   Procedure: CORONARY ARTERY BYPASS GRAFTING (CABG);  Surgeon: Grace Isaac, MD;  Location: [place];  Service: Open Heart Surgery;  Laterality: N/A;  Times 4 using left internal mammary artery and endoscopically harvested left saphenous vein.  Marland Kitchen DILATION AND CURETTAGE OF UTERUS    . INTRAOPERATIVE TRANSESOPHAGEAL ECHOCARDIOGRAM N/A 07/21/2012   Procedure: INTRAOPERATIVE TRANSESOPHAGEAL ECHOCARDIOGRAM;  Surgeon: Grace Isaac, MD;  Location: [place];  Service: Open Heart Surgery;  Laterality: N/A;  . LEFT HEART CATHETERIZATION WITH CORONARY ANGIOGRAM N/A 07/17/2012   Procedure: LEFT HEART CATHETERIZATION WITH CORONARY ANGIOGRAM;  Surgeon: Minus Breeding, MD;   Location: [facility] CATH LAB;  Service: Cardiovascular;  Laterality: N/A;  . TOTAL KNEE ARTHROPLASTY Right 07/15/2012   Procedure: TOTAL KNEE ARTHROPLASTY;  Surgeon: [name], MD;  Location: [place];  Service: Orthopedics;  Laterality: Right;  drapes pulled back to provide cpr, new drape applied, protocol followed  . TOTAL KNEE ARTHROPLASTY Left 09/21/2014   Procedure: TOTAL LEFT KNEE ARTHROPLASTY;  Surgeon: [name], MD;  Location: [place];  Service: Orthopedics;  Laterality: Left;    Current Outpatient Medications:  .  AMBULATORY NON FORMULARY MEDICATION, Take 180 mg by mouth daily. Medication Name: Bempedoic acid 180 mgs vs placebo, CLEAR research study, drug provided, Disp: , Rfl:  .  amLODipine (NORVASC) 2.5 MG tablet, Take 1 tablet (2.5 mg total) by mouth daily., Disp: 90 tablet, Rfl: 3 .  aspirin 81 MG tablet, Take 81 mg by mouth daily., Disp: , Rfl:  .  calcium citrate-vitamin D (CITRACAL+D) 315-200 MG-UNIT per tablet, Take 1 tablet by mouth daily., Disp: , Rfl:  .  Cholecalciferol (VITAMIN D) 2000 UNITS tablet, Take 1 tablet (2,000 Units total) by mouth daily., Disp: 30 tablet, Rfl: 0 .  Coenzyme Q10 (CO Q10) 200 MG CAPS, Take 200 mg by mouth daily., Disp: 30 capsule, Rfl: 0 .  ezetimibe (ZETIA) 10 MG tablet, TAKE ONE TABLET BY MOUTH DAILY, Disp: 30 tablet, Rfl: 10 .  folic acid (CVS FOLIC ACID) 852 MCG  tablet, Take 400 mcg by mouth daily., Disp: , Rfl:  .  lisinopril (PRINIVIL,ZESTRIL) 10 MG tablet, Take 1 tablet (10 mg total) by mouth 2 (two) times daily., Disp: 180 tablet, Rfl: 3 .  metoprolol tartrate (LOPRESSOR) 50 MG tablet, Take 1 tablet (50 mg total) by mouth 2 (two) times daily., Disp: 180 tablet, Rfl: 3  Allergies  Allergen Reactions  . Oxycodone Other (See Comments)    Flu like symptoms.  . Statins Other (See Comments)    Joint pain with Lipitor daily, Pravastatin 10 mg qd and Pravastatin 10 mg M/W/F, Crestor 10 mg once weekly also caused joint aches  . Nickel Rash    Review of Systems  Musculoskeletal: Positive for arthralgias.  All other systems reviewed and are negative.  Objective:   Vitals:   02/20/17 1003  BP: 139/73  Pulse: 64  Resp: 16    General: Well developed, nourished, in no acute distress, alert and oriented x3   Dermatological: Skin is warm, dry and supple bilateral. Nails x 10 are well maintained; remaining integument appears unremarkable at this time. There are no open sores, no preulcerative lesions, no rash or signs of infection present.  Vascular: Dorsalis Pedis artery and Posterior Tibial artery pedal pulses are 2/4 bilateral with immedate capillary fill time. Pedal hair growth present. No varicosities and no lower extremity edema present bilateral.   Neruologic: Grossly intact via light touch bilateral. Vibratory intact via tuning fork bilateral. Protective threshold with Semmes Wienstein monofilament intact to all pedal sites bilateral. Patellar and Achilles deep tendon reflexes 2+ bilateral. No Babinski or clonus noted bilateral.   Musculoskeletal: No gross boney pedal deformities bilateral. No pain, crepitus, or limitation noted with foot and ankle range of motion bilateral. Muscular strength 5/5 in all groups tested bilateral.  Gait: Unassisted, Nonantalgic.    Radiographs:  3 views of the right foot demonstrates no acute findings though it does demonstrate some osteoarthritic changes at the first metatarsophalangeal joint of the right foot.  No fractures are identified.  Assessment & Plan:   Assessment: Hallux limitus capsulitis of the first metatarsophalangeal joint right foot.  Plan: After sterile Betadine skin prep and verbal consent injected the first metatarsophalangeal joint with 2 mg of dexamethasone and local anesthetic.  She tolerated procedure well without complications and started her on one a day 15 mg meloxicam.  Follow-up with her for possible consult.     Max T. [place], [place]

## 2017-02-23 ENCOUNTER — Other Ambulatory Visit: Payer: Self-pay | Admitting: Cardiology

## 2017-02-26 ENCOUNTER — Other Ambulatory Visit: Payer: Self-pay | Admitting: Cardiology

## 2017-02-26 NOTE — Telephone Encounter (Signed)
REFILL 

## 2017-03-12 ENCOUNTER — Encounter: Payer: Self-pay | Admitting: *Deleted

## 2017-03-12 DIAGNOSIS — Z006 Encounter for examination for normal comparison and control in clinical research program: Secondary | ICD-10-CM

## 2017-03-12 NOTE — Progress Notes (Signed)
Subject to research clinic for visit T8-M18 in the Clear research study.  No c/o, aes or saes to report.  Subject did start taking new med which was captured.  99% compliant with meds and new study drug dispensed. Next f/u phone call and next clinic visit scheduled.

## 2017-07-02 NOTE — Progress Notes (Signed)
HPI Susan Welch returns for followup after bypass. She was admitted for total knee replacement and had a V. Fib arrest and was found to have three-vessel disease as described. She had a mildly reduced ejection fraction that was back to normal on follow up.  She had the other knee replaced. Since I last saw her she has done well.  She walks up to 15,000 steps per day.  She works as a Psychologist, occupational at [employer].  She is never had any cardiovascular symptoms.  The patient denies any new symptoms such as chest discomfort, neck or arm discomfort. There has been no new shortness of breath, PND or orthopnea. There have been no reported palpitations, presyncope or syncope.    Allergies  Allergen Reactions  . Oxycodone Other (See Comments)    Flu like symptoms.  . Latex   . Statins Other (See Comments)    Joint pain with Lipitor daily, Pravastatin 10 mg qd and Pravastatin 10 mg M/W/F, Crestor 10 mg once weekly also caused joint aches  . Nickel Rash    Current Outpatient Medications  Medication Sig Dispense Refill  . AMBULATORY NON FORMULARY MEDICATION Take 180 mg by mouth daily. Medication Name: Bempedoic acid 180 mgs vs placebo, CLEAR research study, drug provided    . aspirin 81 MG tablet Take 81 mg by mouth daily.    . calcium citrate-vitamin D (CITRACAL+D) 315-200 MG-UNIT per tablet Take 1 tablet by mouth daily.    . Cholecalciferol (VITAMIN D) 2000 UNITS tablet Take 1 tablet (2,000 Units total) by mouth daily. 30 tablet 0  . Coenzyme Q10 (CO Q10) 200 MG CAPS Take 200 mg by mouth daily. 30 capsule 0  . ezetimibe (ZETIA) 10 MG tablet TAKE ONE TABLET BY MOUTH DAILY 90 tablet 2  . folic acid (CVS FOLIC ACID) 161 MCG tablet Take 400 mcg by mouth daily.    Marland Kitchen lisinopril (PRINIVIL,ZESTRIL) 10 MG tablet TAKE ONE TABLET BY MOUTH TWICE A DAY 180 tablet 2  . meloxicam (MOBIC) 15 MG tablet Take 1 tablet (15 mg total) by mouth daily. 30 tablet 3  . metoprolol tartrate (LOPRESSOR) 50 MG tablet TAKE ONE  TABLET BY MOUTH TWICE A DAY 180 tablet 2  . chlorthalidone (HYGROTON) 25 MG tablet Take 1 tablet (25 mg total) by mouth daily. 30 tablet 11   No current facility-administered medications for this visit.     Past Medical History:  Diagnosis Date  . Anemia   . Anxiety    Hx: of situational anxiety  . Arthritis    a. bilat knees s/p R TKA 07/15/2012  . Carotid stenosis    Pre-CABG Dopplers 07/19/12: Right 40-59%.  . Coronary artery disease    a. s/p VF arrest during TKR 7/14 2/2 NSTEMI => LHC with 3v CAD =>  s/p CABG 07/21/12 (LIMA-LAD, SVG-RI and OM1, SVG-distal RCA).   . Coronary artery disease involving native coronary artery of native heart without angina pectoris 06/17/2016  . DJD (degenerative joint disease) of knee 09/21/2014  . DVT (deep venous thrombosis) ([place]) 07/2012  . GERD (gastroesophageal reflux disease)   . Headache(784.0)    Hx: of migraines  . HTN (hypertension) 09/20/2011  . Hyperlipidemia   . Hypertension   . Ischemic cardiomyopathy    Echo 07/19/12: EF 40-45%, basal to mid anteroseptal AK, distal anteroseptal HK, mild MR.   . Knee joint replacement by other means 07/26/2012  . Osteoarthritis of right knee 07/16/2012  . Panic attacks   .  Pneumonia   . Serum potassium elevated 12/14/2015  . Ventricular fibrillation ([place])    a. 07/15/2012 in setting of R TKA  . Wears glasses     Past Surgical History:  Procedure Laterality Date  . ABDOMINAL HYSTERECTOMY    . CORONARY ARTERY BYPASS GRAFT N/A 07/21/2012   Procedure: CORONARY ARTERY BYPASS GRAFTING (CABG);  Surgeon: Grace Isaac, MD;  Location: [place];  Service: Open Heart Surgery;  Laterality: N/A;  Times 4 using left internal mammary artery and endoscopically harvested left saphenous vein.  Marland Kitchen DILATION AND CURETTAGE OF UTERUS    . INTRAOPERATIVE TRANSESOPHAGEAL ECHOCARDIOGRAM N/A 07/21/2012   Procedure: INTRAOPERATIVE TRANSESOPHAGEAL ECHOCARDIOGRAM;  Surgeon: Grace Isaac, MD;  Location: [place];  Service: Open Heart  Surgery;  Laterality: N/A;  . LEFT HEART CATHETERIZATION WITH CORONARY ANGIOGRAM N/A 07/17/2012   Procedure: LEFT HEART CATHETERIZATION WITH CORONARY ANGIOGRAM;  Surgeon: Minus Breeding, MD;  Location: [facility] CATH LAB;  Service: Cardiovascular;  Laterality: N/A;  . TOTAL KNEE ARTHROPLASTY Right 07/15/2012   Procedure: TOTAL KNEE ARTHROPLASTY;  Surgeon: [name], MD;  Location: [place];  Service: Orthopedics;  Laterality: Right;  drapes pulled back to provide cpr, new drape applied, protocol followed  . TOTAL KNEE ARTHROPLASTY Left 09/21/2014   Procedure: TOTAL LEFT KNEE ARTHROPLASTY;  Surgeon: [name], MD;  Location: [place];  Service: Orthopedics;  Laterality: Left;    ROS:    As stated in the HPI and negative for all other systems.  PHYSICAL EXAM BP 136/84 (BP Location: Left Arm, Patient Position: Sitting, Cuff Size: Normal)   Pulse 72   Ht 5\' 2"  (1.575 m)   Wt 154 lb (69.9 kg)   BMI 28.17 kg/m   GENERAL:  Well appearing NECK:  No jugular venous distention, waveform within normal limits, carotid upstroke brisk and symmetric, no bruits, no thyromegaly LUNGS:  Clear to auscultation bilaterally CHEST:  Well healed sternotomy scar. HEART:  PMI not displaced or sustained,S1 and S2 within normal limits, no S3, no S4, no clicks, no rubs, no murmurs ABD:  Flat, positive bowel sounds normal in frequency in pitch, no bruits, no rebound, no guarding, no midline pulsatile mass, no hepatomegaly, no splenomegaly EXT:  2 plus pulses throughout, no edema, no cyanosis no clubbing    GENERAL:  Well appearing NECK:  No jugular venous distention, waveform within normal limits, carotid upstroke brisk and symmetric, no bruits, no thyromegaly LUNGS:  Clear to auscultation bilaterally CHEST:  Unremarkable HEART:  PMI not displaced or sustained,S1 and S2 within normal limits, no S3, no S4, no clicks, no rubs, no murmurs ABD:  Flat, positive bowel sounds normal in frequency in pitch, no bruits, no  rebound, no guarding, no midline pulsatile mass, no hepatomegaly, no splenomegaly EXT:  2 plus pulses throughout, no edema, no cyanosis no clubbing    EKG:  Sinus rhythm, rate 72, axis within normal limits, intervals within normal limits, old anteroseptal infarct, no acute ST-T wave changes.  No change from 06/30/15    ASSESSMENT AND PLAN  CAD:  The patient has no new sypmtoms.  No further cardiovascular testing is indicated.  We will continue with aggressive risk reduction and meds as listed.  HTN:  Her BP is controlled.   However, she is quite concerned about hyperkalemia that she has had and she is been working very hard to reduce her potassium intake.  Given this I think is prudent to stop the Norvasc and start chlorthalidone 25 mg daily.  We will check a basic metabolic profile in a couple of weeks.  Provided that that is normal she could increase her potassium in her diet but I told her we would follow her to make sure that this is well controlled.  DYSLIPIDEMIA:  She is followed in the CLEAR trial.   CARDIOMYOPATHY:  Her EF was normal in 2016.  No change in therapy.   CAROTID STENOSIS.   This was trace.  No further imaging is indicated.

## 2017-07-03 ENCOUNTER — Ambulatory Visit: Payer: Medicare Other | Admitting: Cardiology

## 2017-07-03 ENCOUNTER — Encounter: Payer: Self-pay | Admitting: Cardiology

## 2017-07-03 VITALS — BP 136/84 | HR 72 | Ht 62.0 in | Wt 154.0 lb

## 2017-07-03 DIAGNOSIS — I2510 Atherosclerotic heart disease of native coronary artery without angina pectoris: Secondary | ICD-10-CM

## 2017-07-03 DIAGNOSIS — I10 Essential (primary) hypertension: Secondary | ICD-10-CM | POA: Diagnosis not present

## 2017-07-03 DIAGNOSIS — Z79899 Other long term (current) drug therapy: Secondary | ICD-10-CM | POA: Diagnosis not present

## 2017-07-03 MED ORDER — CHLORTHALIDONE 25 MG PO TABS: 25.0000 mg | ORAL_TABLET | Freq: Every day | ORAL | 11 refills | Status: DC

## 2017-07-03 NOTE — Patient Instructions (Signed)
Medication Instructions:  STOP- Amlodipine START- Chlorthalidone 25 mg daily   If you need a refill on your cardiac medications before your next appointment, please call your pharmacy.  Labwork: BMP in 2 weeks HERE IN OUR OFFICE AT [facility]  Take the provided lab slips with you to the lab for your blood draw.   You will NOT need to fast   Testing/Procedures: None Ordered  Follow-Up: Your physician wants you to follow-up in: 1 Year. You should receive a reminder letter in the mail two months in advance. If you do not receive a letter, please call our office [phone].      Thank you for choosing [facility] at [facility]!!

## 2017-07-04 ENCOUNTER — Encounter (INDEPENDENT_AMBULATORY_CARE_PROVIDER_SITE_OTHER): Payer: Medicare Other | Admitting: Ophthalmology

## 2017-07-04 ENCOUNTER — Encounter: Payer: Self-pay | Admitting: *Deleted

## 2017-07-04 DIAGNOSIS — D3132 Benign neoplasm of left choroid: Secondary | ICD-10-CM | POA: Diagnosis not present

## 2017-07-04 DIAGNOSIS — I10 Essential (primary) hypertension: Secondary | ICD-10-CM | POA: Diagnosis not present

## 2017-07-04 DIAGNOSIS — H35033 Hypertensive retinopathy, bilateral: Secondary | ICD-10-CM | POA: Diagnosis not present

## 2017-07-04 DIAGNOSIS — H43813 Vitreous degeneration, bilateral: Secondary | ICD-10-CM

## 2017-07-04 DIAGNOSIS — Z006 Encounter for examination for normal comparison and control in clinical research program: Secondary | ICD-10-CM

## 2017-07-04 NOTE — Progress Notes (Signed)
Late entry:  In lieu of phone call had a face to face for visit T7M15 for the clear research study.  Ms. Lashley stopped by the research clinic on 06/10/2017.  Everything was fine.

## 2017-07-21 DIAGNOSIS — I10 Essential (primary) hypertension: Secondary | ICD-10-CM | POA: Diagnosis not present

## 2017-07-21 DIAGNOSIS — Z79899 Other long term (current) drug therapy: Secondary | ICD-10-CM | POA: Diagnosis not present

## 2017-07-22 LAB — BASIC METABOLIC PANEL
BUN/Creatinine Ratio: 26 (ref 12–28)
BUN: 29 mg/dL — ABNORMAL HIGH (ref 8–27)
CALCIUM: 10.7 mg/dL — AB (ref 8.7–10.3)
CO2: 24 mmol/L (ref 20–29)
CREATININE: 1.1 mg/dL — AB (ref 0.57–1.00)
Chloride: 99 mmol/L (ref 96–106)
GFR, EST AFRICAN AMERICAN: 58 mL/min/{1.73_m2} — AB (ref >59)
GFR, EST NON AFRICAN AMERICAN: 51 mL/min/{1.73_m2} — AB (ref >59)
Glucose: 80 mg/dL (ref 65–99)
POTASSIUM: 4.6 mmol/L (ref 3.5–5.2)
Sodium: 141 mmol/L (ref 134–144)

## 2017-09-10 ENCOUNTER — Encounter: Payer: Self-pay | Admitting: *Deleted

## 2017-09-10 DIAGNOSIS — Z006 Encounter for examination for normal comparison and control in clinical research program: Secondary | ICD-10-CM

## 2017-09-10 NOTE — Progress Notes (Signed)
Subject to research clinic for T10M24 in the Clear Research study.  No cos, aes or saes to report.  100%compliant with meds and new drug dispensed.  Next phone follow up and clinic visit scheduled.  Subject met inclusion and exclusion criteria.  The informed consent form, study requirements and expectations were reviewed with the subject and questions and concerns were addressed prior to the signing of the consent form.  The subject verbalized understanding of the trial requirements.  The subject agreed to participate in the CLEAR trial and signed the informed consent.  The informed consent was obtained prior to performance of any protocol-specific procedures for the subject.  A copy of the signed informed consent was given to the subject and a copy was placed in the subject's medical record.

## 2018-01-26 ENCOUNTER — Other Ambulatory Visit: Payer: Self-pay | Admitting: Cardiology

## 2018-01-26 NOTE — Telephone Encounter (Signed)
Rx has been sent to the pharmacy electronically. ° °

## 2018-03-11 ENCOUNTER — Encounter: Payer: Medicare Other | Admitting: *Deleted

## 2018-03-11 VITALS — BP 128/59 | HR 62 | Wt 154.6 lb

## 2018-03-11 DIAGNOSIS — Z006 Encounter for examination for normal comparison and control in clinical research program: Secondary | ICD-10-CM

## 2018-03-11 NOTE — Research (Signed)
Subject to research clinic for visit T12-M30 in the Clear research study.  No cos, aes or saes to report.  99% compliant with meds and new drug dispensed.  Next phone call and clinic visit scheduled.

## 2018-05-01 ENCOUNTER — Other Ambulatory Visit: Payer: Self-pay | Admitting: Cardiology

## 2018-05-01 NOTE — Telephone Encounter (Signed)
Chlorthalidone refilled.

## 2018-06-05 ENCOUNTER — Telehealth: Payer: Self-pay | Admitting: *Deleted

## 2018-06-05 NOTE — Telephone Encounter (Signed)
Spoke with patient for visit [phone] in the clear research study. No cos, aes or saes to report. Next visit is scheduled, however, will re-call subject to verify that the clinic is open and seeing patients face to face before she returns to the clinic.

## 2018-06-25 ENCOUNTER — Other Ambulatory Visit: Payer: Self-pay | Admitting: Cardiology

## 2018-07-17 ENCOUNTER — Other Ambulatory Visit: Payer: Self-pay

## 2018-07-17 ENCOUNTER — Encounter (INDEPENDENT_AMBULATORY_CARE_PROVIDER_SITE_OTHER): Payer: Medicare Other | Admitting: Ophthalmology

## 2018-07-17 DIAGNOSIS — H43813 Vitreous degeneration, bilateral: Secondary | ICD-10-CM

## 2018-07-17 DIAGNOSIS — H35033 Hypertensive retinopathy, bilateral: Secondary | ICD-10-CM

## 2018-07-17 DIAGNOSIS — D3132 Benign neoplasm of left choroid: Secondary | ICD-10-CM

## 2018-07-17 DIAGNOSIS — I10 Essential (primary) hypertension: Secondary | ICD-10-CM | POA: Diagnosis not present

## 2018-09-07 ENCOUNTER — Other Ambulatory Visit: Payer: Self-pay

## 2018-09-07 ENCOUNTER — Encounter: Payer: Medicare Other | Admitting: *Deleted

## 2018-09-07 VITALS — BP 125/62 | HR 55 | Wt 157.8 lb

## 2018-09-07 DIAGNOSIS — Z006 Encounter for examination for normal comparison and control in clinical research program: Secondary | ICD-10-CM

## 2018-09-07 NOTE — Research (Signed)
Patient to clinic for visit T14M36 in the Clear Research study.  No cos, aes or saes to report.  Re-consented to [place] version 7.0 12Mar2020, local 15Apr2020.  Clear Informed Consent   Subject Name: Latrecia Mello  Subject met inclusion and exclusion criteria.  The informed consent form, study requirements and expectations were reviewed with the subject and questions and concerns were addressed prior to the signing of the consent form.  The subject verbalized understanding of the trial requirements.  The subject agreed to participate in the Clear trial and signed the informed consenton 31Aug2020.  The informed consent was obtained prior to performance of any protocol-specific procedures for the subject.  A copy of the signed informed consent was given to the subject and a copy was placed in the subject's medical record.   Hugh Pruitt Jr.  Next phone call and clinic visit scheduled. 

## 2018-09-21 ENCOUNTER — Ambulatory Visit: Payer: Medicare Other | Admitting: Cardiology

## 2018-10-11 DIAGNOSIS — E785 Hyperlipidemia, unspecified: Secondary | ICD-10-CM | POA: Insufficient documentation

## 2018-10-11 NOTE — Progress Notes (Signed)
HPI Susan Welch returns for followup after bypass. She was admitted for total knee replacement and had a V. Fib arrest and was found to have three-vessel disease as described. She had a mildly reduced ejection fraction that was back to normal on follow up.  She had the other knee replaced. Since I last saw her she has done well.  She is having some foot problems but she is not walking quite as far she used to.  She walks about 10,000 steps some days and 5000 steps the other days.  The patient denies any new symptoms such as chest discomfort, neck or arm discomfort. There has been no new shortness of breath, PND or orthopnea. There have been no reported palpitations, presyncope or syncope.  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   Allergies  Allergen Reactions  . Oxycodone Other (See Comments)    Flu like symptoms.  . Latex   . Statins Other (See Comments)    Joint pain with Lipitor daily, Pravastatin 10 mg qd and Pravastatin 10 mg M/W/F, Crestor 10 mg once weekly also caused joint aches  . Nickel Rash    Current Outpatient Medications  Medication Sig Dispense Refill  . AMBULATORY NON FORMULARY MEDICATION Take 180 mg by mouth daily. Medication Name: Bempedoic acid 180 mgs vs placebo, CLEAR research study, drug provided    . aspirin 81 MG tablet Take 81 mg by mouth daily.    . calcium citrate-vitamin D (CITRACAL+D) 315-200 MG-UNIT per tablet Take 1 tablet by mouth daily.    . chlorthalidone (HYGROTON) 25 MG tablet TAKE ONE TABLET BY MOUTH DAILY 90 tablet 0  . Cholecalciferol (VITAMIN D) 2000 UNITS tablet Take 1 tablet (2,000 Units total) by mouth daily. 30 tablet 0  . Coenzyme Q10 (CO Q10) 200 MG CAPS Take 200 mg by mouth daily. 30 capsule 0  . ezetimibe (ZETIA) 10 MG tablet TAKE ONE TABLET BY MOUTH DAILY 90 tablet 0  . folic acid ([employer]S FOLIC ACID) A999333 MCG tablet Take 400 mcg by mouth daily.    Marland Kitchen lisinopril (ZESTRIL) 10 MG tablet TAKE ONE TABLET BY MOUTH  TWICE A DAY 180 tablet 0  . meloxicam (MOBIC) 15 MG tablet Take 1 tablet (15 mg total) by mouth daily. 30 tablet 3  . metoprolol tartrate (LOPRESSOR) 50 MG tablet TAKE ONE TABLET BY MOUTH TWICE A DAY 180 tablet 0   No current facility-administered medications for this visit.     Past Medical History:  Diagnosis Date  . Anemia   . Anxiety    Hx: of situational anxiety  . Arthritis    a. bilat knees s/p R TKA 07/15/2012  . Carotid stenosis    Pre-CABG Dopplers 07/19/12: Right 40-59%.  . Coronary artery disease    a. s/p VF arrest during TKR 7/14 2/2 NSTEMI => LHC with 3v CAD =>  s/p CABG 07/21/12 (LIMA-LAD, SVG-RI and OM1, SVG-distal RCA).   . Coronary artery disease involving native coronary artery of native heart without angina pectoris 06/17/2016  . DJD (degenerative joint disease) of knee 09/21/2014  . DVT (deep venous thrombosis) ([place]) 07/2012  . GERD (gastroesophageal reflux disease)   . Headache(784.0)    Hx: of migraines  . HTN (hypertension) 09/20/2011  . Hyperlipidemia   . Hypertension   . Ischemic cardiomyopathy    Echo 07/19/12: EF 40-45%, basal to mid anteroseptal AK, distal anteroseptal HK, mild MR.   . Knee joint replacement  by other means 07/26/2012  . Osteoarthritis of right knee 07/16/2012  . Panic attacks   . Pneumonia   . Serum potassium elevated 12/14/2015  . Ventricular fibrillation ([place])    a. 07/15/2012 in setting of R TKA  . Wears glasses     Past Surgical History:  Procedure Laterality Date  . ABDOMINAL HYSTERECTOMY    . CORONARY ARTERY BYPASS GRAFT N/A 07/21/2012   Procedure: CORONARY ARTERY BYPASS GRAFTING (CABG);  Surgeon: Grace Isaac, MD;  Location: [place];  Service: Open Heart Surgery;  Laterality: N/A;  Times 4 using left internal mammary artery and endoscopically harvested left saphenous vein.  Marland Kitchen DILATION AND CURETTAGE OF UTERUS    . INTRAOPERATIVE TRANSESOPHAGEAL ECHOCARDIOGRAM N/A 07/21/2012   Procedure: INTRAOPERATIVE TRANSESOPHAGEAL  ECHOCARDIOGRAM;  Surgeon: Grace Isaac, MD;  Location: [place];  Service: Open Heart Surgery;  Laterality: N/A;  . LEFT HEART CATHETERIZATION WITH CORONARY ANGIOGRAM N/A 07/17/2012   Procedure: LEFT HEART CATHETERIZATION WITH CORONARY ANGIOGRAM;  Surgeon: Minus Breeding, MD;  Location: [facility] CATH LAB;  Service: Cardiovascular;  Laterality: N/A;  . TOTAL KNEE ARTHROPLASTY Right 07/15/2012   Procedure: TOTAL KNEE ARTHROPLASTY;  Surgeon: [name], MD;  Location: [place];  Service: Orthopedics;  Laterality: Right;  drapes pulled back to provide cpr, new drape applied, protocol followed  . TOTAL KNEE ARTHROPLASTY Left 09/21/2014   Procedure: TOTAL LEFT KNEE ARTHROPLASTY;  Surgeon: [name], MD;  Location: [place];  Service: Orthopedics;  Laterality: Left;    ROS:    As stated in the HPI and negative for all other systems.  PHYSICAL EXAM BP 134/82   Pulse 64   Temp (!) 95.9 F (35.5 C)   Ht 5\' 2"  (1.575 m)   Wt 158 lb 9.6 oz (71.9 kg)   SpO2 95%   BMI 29.01 kg/m   GENERAL:  Well appearing NECK:  No jugular venous distention, waveform within normal limits, carotid upstroke brisk and symmetric, no bruits, no thyromegaly LUNGS:  Clear to auscultation bilaterally CHEST:  Well healed sternotomy scar. HEART:  PMI not displaced or sustained,S1 and S2 within normal limits, no S3, no S4, no clicks, no rubs, no murmurs ABD:  Flat, positive bowel sounds normal in frequency in pitch, no bruits, no rebound, no guarding, no midline pulsatile mass, no hepatomegaly, no splenomegaly EXT:  2 plus pulses throughout, no edema, no cyanosis no clubbing   EKG:  Sinus rhythm, rate 68 axis within normal limits, intervals within normal limits, old anteroseptal infarct, no acute ST-T wave changes.  No change from 06/30/15    ASSESSMENT AND PLAN  CAD:  The patient has no new sypmtoms.  No further cardiovascular testing is indicated.  We will continue with aggressive risk reduction and meds as listed.  HTN:   Her BP is controlled.  No change in therapy.   DYSLIPIDEMIA:  She is followed in the CLEAR trial.   CARDIOMYOPATHY:  Her EF was normal in 2016. No further work up.   CAROTID STENOSIS.   This was trace.  No change in therapy.

## 2018-10-12 ENCOUNTER — Other Ambulatory Visit: Payer: Self-pay

## 2018-10-12 ENCOUNTER — Ambulatory Visit (INDEPENDENT_AMBULATORY_CARE_PROVIDER_SITE_OTHER): Payer: Medicare Other | Admitting: Cardiology

## 2018-10-12 ENCOUNTER — Encounter: Payer: Self-pay | Admitting: Cardiology

## 2018-10-12 VITALS — BP 134/82 | HR 64 | Temp 95.9°F | Ht 62.0 in | Wt 158.6 lb

## 2018-10-12 DIAGNOSIS — I10 Essential (primary) hypertension: Secondary | ICD-10-CM | POA: Diagnosis not present

## 2018-10-12 DIAGNOSIS — E785 Hyperlipidemia, unspecified: Secondary | ICD-10-CM | POA: Diagnosis not present

## 2018-10-12 DIAGNOSIS — I2510 Atherosclerotic heart disease of native coronary artery without angina pectoris: Secondary | ICD-10-CM

## 2018-10-12 NOTE — Patient Instructions (Signed)
Medication Instructions:  Your physician recommends that you continue on your current medications as directed. Please refer to the Current Medication list given to you today.  If you need a refill on your cardiac medications before your next appointment, please call your pharmacy.   Lab work: NONE  Testing/Procedures: NONE  Follow-Up: At [facility], you and your health needs are our priority.  As part of our continuing mission to provide you with exceptional heart care, we have created designated Provider Care Teams.  These Care Teams include your primary Cardiologist (physician) and Advanced Practice Providers (APPs -  Physician Assistants and Nurse Practitioners) who all work together to provide you with the care you need, when you need it. You will need a follow up appointment in 12 months.  Please call our office 2 months in advance to schedule this appointment.  You may see Dr. Hochrein or one of the following Advanced Practice Providers on your designated Care Team:   Rhonda Barrett, PA-C Kathryn Lawrence, DNP, ANP      

## 2018-11-11 ENCOUNTER — Other Ambulatory Visit: Payer: Self-pay | Admitting: Cardiology

## 2018-12-08 ENCOUNTER — Other Ambulatory Visit: Payer: Self-pay | Admitting: Emergency Medicine

## 2018-12-08 DIAGNOSIS — Z1231 Encounter for screening mammogram for malignant neoplasm of breast: Secondary | ICD-10-CM

## 2019-01-28 ENCOUNTER — Telehealth: Payer: Self-pay | Admitting: *Deleted

## 2019-01-28 DIAGNOSIS — Z006 Encounter for examination for normal comparison and control in clinical research program: Secondary | ICD-10-CM

## 2019-01-28 NOTE — Telephone Encounter (Signed)
Spoke with patient for visit T15-M39 in the clear research study.  No aes or saes to report.  Next clinic visit scheduled.

## 2019-03-11 ENCOUNTER — Other Ambulatory Visit: Payer: Self-pay

## 2019-03-11 ENCOUNTER — Encounter: Payer: Medicare Other | Admitting: *Deleted

## 2019-03-11 VITALS — BP 141/67 | HR 59 | Wt 156.8 lb

## 2019-03-11 DIAGNOSIS — Z006 Encounter for examination for normal comparison and control in clinical research program: Secondary | ICD-10-CM

## 2019-03-11 NOTE — Research (Signed)
Patient to research clinic for visit [phone] in the clear research study.  No aes or saes to report.  Next phone call and clinic visit scheduled.

## 2019-05-03 DIAGNOSIS — H00021 Hordeolum internum right upper eyelid: Secondary | ICD-10-CM | POA: Diagnosis not present

## 2019-06-23 ENCOUNTER — Telehealth: Payer: Self-pay | Admitting: *Deleted

## 2019-06-23 DIAGNOSIS — Z006 Encounter for examination for normal comparison and control in clinical research program: Secondary | ICD-10-CM

## 2019-06-23 NOTE — Telephone Encounter (Signed)
12:12 LVM to call CV Research for Clear M45 phone visit at [phone]

## 2019-06-23 NOTE — Telephone Encounter (Signed)
Spoke with patient for visit T17-M45 in the clear research study.  No aes or saes to report.  Next clinic visit scheduled.

## 2019-07-23 ENCOUNTER — Encounter (INDEPENDENT_AMBULATORY_CARE_PROVIDER_SITE_OTHER): Payer: Medicare Other | Admitting: Ophthalmology

## 2019-09-08 ENCOUNTER — Encounter: Payer: Medicare Other | Admitting: *Deleted

## 2019-09-08 ENCOUNTER — Other Ambulatory Visit: Payer: Self-pay

## 2019-09-08 VITALS — BP 114/72 | HR 61 | Wt 151.0 lb

## 2019-09-08 DIAGNOSIS — Z006 Encounter for examination for normal comparison and control in clinical research program: Secondary | ICD-10-CM

## 2019-09-08 NOTE — Research (Signed)
Subject came into clinic for T18, M48 visit in the CLEAR research study.   Subject was re-consented to [place] Version 8.1 [id number] Local [id number] on 09/08/2019 at [place]     Subject Name: Susan Welch  Subject met inclusion and exclusion criteria.  The informed consent form, study requirements and expectations were reviewed with the subject and questions and concerns were addressed prior to the signing of the consent form.  The subject verbalized understanding of the trial requirements.  The subject agreed to participate in the CLEAR trial and signed the informed consent at 0810 on 09/08/19  The informed consent was obtained prior to performance of any protocol-specific procedures for the subject.  A copy of the signed informed consent was given to the subject and a copy was placed in the subject's medical record.   Preston Fleeting C    Subject's concomitant medications have been reviewed and updated. No AE's/SAE's to report to sponsor at this time. New IP dispensed and next appointment scheduled.

## 2019-09-24 ENCOUNTER — Encounter (INDEPENDENT_AMBULATORY_CARE_PROVIDER_SITE_OTHER): Payer: Medicare Other | Admitting: Ophthalmology

## 2019-09-24 ENCOUNTER — Other Ambulatory Visit: Payer: Self-pay

## 2019-09-24 DIAGNOSIS — H35033 Hypertensive retinopathy, bilateral: Secondary | ICD-10-CM | POA: Diagnosis not present

## 2019-09-24 DIAGNOSIS — H43813 Vitreous degeneration, bilateral: Secondary | ICD-10-CM | POA: Diagnosis not present

## 2019-09-24 DIAGNOSIS — D3132 Benign neoplasm of left choroid: Secondary | ICD-10-CM

## 2019-09-24 DIAGNOSIS — I10 Essential (primary) hypertension: Secondary | ICD-10-CM

## 2019-10-11 DIAGNOSIS — Z7189 Other specified counseling: Secondary | ICD-10-CM

## 2019-10-11 DIAGNOSIS — I429 Cardiomyopathy, unspecified: Secondary | ICD-10-CM | POA: Insufficient documentation

## 2019-10-11 HISTORY — DX: Other specified counseling: Z71.89

## 2019-10-11 NOTE — Progress Notes (Signed)
Cardiology Office Note   Date:  10/14/2019   ID:  Susan Welch, DOB 1945-07-28, MRN [id number]  PCP:  Patient, No Pcp Per  Cardiologist:   Minus Breeding, MD   Chief Complaint  Patient presents with  . Coronary Artery Disease      History of Present Illness: Susan Welch is a 74 y.o. female who presents for followup after bypass. She was admitted for total knee replacement and had a V. Fib arrest and was found to have three-vessel disease as described. She had a mildly reduced ejection fraction that was back to normal on follow up.  She had the other knee replaced. Since I last saw her she has had no new cardiac complaints.  The patient denies any new symptoms such as chest discomfort, neck or arm discomfort. There has been no new shortness of breath, PND or orthopnea. There have been no reported palpitations, presyncope or syncope.  She has a new dog who is hard to walk but she does walk with her.    Past Medical History:  Diagnosis Date  . Anemia   . Anxiety    Hx: of situational anxiety  . Arthritis    a. bilat knees s/p R TKA 07/15/2012  . Carotid stenosis    Pre-CABG Dopplers 07/19/12: Right 40-59%.  . Coronary artery disease    a. s/p VF arrest during TKR 7/14 2/2 NSTEMI => LHC with 3v CAD =>  s/p CABG 07/21/12 (LIMA-LAD, SVG-RI and OM1, SVG-distal RCA).   . Coronary artery disease involving native coronary artery of native heart without angina pectoris 06/17/2016  . DJD (degenerative joint disease) of knee 09/21/2014  . DVT (deep venous thrombosis) ([place]) 07/2012  . GERD (gastroesophageal reflux disease)   . Headache(784.0)    Hx: of migraines  . HTN (hypertension) 09/20/2011  . Hyperlipidemia   . Hypertension   . Ischemic cardiomyopathy    Echo 07/19/12: EF 40-45%, basal to mid anteroseptal AK, distal anteroseptal HK, mild MR.   . Knee joint replacement by other means 07/26/2012  . Osteoarthritis of right knee 07/16/2012  . Panic attacks   . Pneumonia   . Serum  potassium elevated 12/14/2015  . Ventricular fibrillation ([place])    a. 07/15/2012 in setting of R TKA  . Wears glasses     Past Surgical History:  Procedure Laterality Date  . ABDOMINAL HYSTERECTOMY    . CORONARY ARTERY BYPASS GRAFT N/A 07/21/2012   Procedure: CORONARY ARTERY BYPASS GRAFTING (CABG);  Surgeon: Grace Isaac, MD;  Location: [place];  Service: Open Heart Surgery;  Laterality: N/A;  Times 4 using left internal mammary artery and endoscopically harvested left saphenous vein.  Marland Kitchen DILATION AND CURETTAGE OF UTERUS    . INTRAOPERATIVE TRANSESOPHAGEAL ECHOCARDIOGRAM N/A 07/21/2012   Procedure: INTRAOPERATIVE TRANSESOPHAGEAL ECHOCARDIOGRAM;  Surgeon: Grace Isaac, MD;  Location: [place];  Service: Open Heart Surgery;  Laterality: N/A;  . LEFT HEART CATHETERIZATION WITH CORONARY ANGIOGRAM N/A 07/17/2012   Procedure: LEFT HEART CATHETERIZATION WITH CORONARY ANGIOGRAM;  Surgeon: Minus Breeding, MD;  Location: [facility] CATH LAB;  Service: Cardiovascular;  Laterality: N/A;  . TOTAL KNEE ARTHROPLASTY Right 07/15/2012   Procedure: TOTAL KNEE ARTHROPLASTY;  Surgeon: [name], MD;  Location: [place];  Service: Orthopedics;  Laterality: Right;  drapes pulled back to provide cpr, new drape applied, protocol followed  . TOTAL KNEE ARTHROPLASTY Left 09/21/2014   Procedure: TOTAL LEFT KNEE ARTHROPLASTY;  Surgeon: [name], MD;  Location: [place];  Service: Orthopedics;  Laterality: Left;     Current Outpatient Medications  Medication Sig Dispense Refill  . AMBULATORY NON FORMULARY MEDICATION Take 180 mg by mouth daily. Medication Name: Bempedoic acid 180 mgs vs placebo, CLEAR research study, drug provided    . aspirin 81 MG tablet Take 81 mg by mouth daily.    . calcium citrate-vitamin D (CITRACAL+D) 315-200 MG-UNIT per tablet Take 1 tablet by mouth daily.    . chlorthalidone (HYGROTON) 25 MG tablet TAKE ONE TABLET BY MOUTH DAILY 90 tablet 3  . Cholecalciferol (VITAMIN D) 2000 UNITS tablet Take 1  tablet (2,000 Units total) by mouth daily. 30 tablet 0  . Coenzyme Q10 (CO Q10) 200 MG CAPS Take 200 mg by mouth daily. 30 capsule 0  . ezetimibe (ZETIA) 10 MG tablet TAKE ONE TABLET BY MOUTH DAILY 90 tablet 3  . folic acid (CVS FOLIC ACID) 563 MCG tablet Take by mouth daily.     Marland Kitchen lisinopril (ZESTRIL) 10 MG tablet TAKE ONE TABLET BY MOUTH TWICE A DAY 180 tablet 3  . meloxicam (MOBIC) 15 MG tablet Take 1 tablet (15 mg total) by mouth daily. 30 tablet 3  . metoprolol tartrate (LOPRESSOR) 50 MG tablet TAKE ONE TABLET BY MOUTH TWICE A DAY 180 tablet 3   No current facility-administered medications for this visit.    Allergies:   Oxycodone, Latex, Statins, and Nickel    ROS:  Please see the history of present illness.   Otherwise, review of systems are positive for none.   All other systems are reviewed and negative.    PHYSICAL EXAM: VS:  BP 110/71   Pulse 66   Ht 5\' 2"  (1.575 m)   Wt 148 lb 9.6 oz (67.4 kg)   SpO2 99%   BMI 27.18 kg/m  , BMI Body mass index is 27.18 kg/m. GENERAL:  Well appearing NECK:  No jugular venous distention, waveform within normal limits, carotid upstroke brisk and symmetric, no bruits, no thyromegaly LUNGS:  Clear to auscultation bilaterally CHEST:  Well healed sternotomy scar. HEART:  PMI not displaced or sustained,S1 and S2 within normal limits, no S3, no S4, no clicks, no rubs, no murmurs ABD:  Flat, positive bowel sounds normal in frequency in pitch, no bruits, no rebound, no guarding, no midline pulsatile mass, no hepatomegaly, no splenomegaly EXT:  2 plus pulses throughout, no edema, no cyanosis no clubbing   EKG:  EKG is ordered today. The ekg ordered today demonstrates NSR, rate 66, old inferior MI.  No acute ST T wave changes.   Recent Labs: No results found for requested labs within last 8760 hours.    Lipid Panel    Component Value Date/Time   CHOL 215 (H) 05/18/2013 0844   TRIG 184.0 (H) 05/18/2013 0844   HDL 58.90 05/18/2013 0844    CHOLHDL 4 05/18/2013 0844   VLDL 36.8 05/18/2013 0844   LDLCALC 119 (H) 05/18/2013 0844   LDLDIRECT 174.9 02/08/2013 0914      Wt Readings from Last 3 Encounters:  10/14/19 148 lb 9.6 oz (67.4 kg)  09/08/19 151 lb (68.5 kg)  03/11/19 156 lb 12.8 oz (71.1 kg)      Other studies Reviewed: Additional studies/ records that were reviewed today include: Labs. Review of the above records demonstrates:  Please see elsewhere in the note.     ASSESSMENT AND PLAN:  CAD:   The patient has no new sypmtoms.  No further cardiovascular testing is indicated.  We will continue  with aggressive risk reduction and meds as listed.  HTN:  Her BP is controlled.  No change in therapy.  DYSLIPIDEMIA:   She is followed in the CLEAR trial she is followed in the CLEAR trial.   CARDIOMYOPATHY:  Her EF was normal in 2016.  No further work-up.   CAROTID STENOSIS.     This was traced previously no change in therapy.   COVID EDUCATION: She has had her vaccines and we talked about the booster.  Current medicines are reviewed at length with the patient today.  The patient does not have concerns regarding medicines.  The following changes have been made:  no change  Labs/ tests ordered today include: None  Orders Placed This Encounter  Procedures  . EKG 12-Lead     Disposition:   FU with me in one year.     Signed, Minus Breeding, MD  10/14/2019 9:37 AM    [facility]

## 2019-10-14 ENCOUNTER — Ambulatory Visit: Payer: Medicare Other | Admitting: Cardiology

## 2019-10-14 ENCOUNTER — Other Ambulatory Visit: Payer: Self-pay

## 2019-10-14 ENCOUNTER — Encounter: Payer: Self-pay | Admitting: Cardiology

## 2019-10-14 VITALS — BP 110/71 | HR 66 | Ht 62.0 in | Wt 148.6 lb

## 2019-10-14 DIAGNOSIS — I429 Cardiomyopathy, unspecified: Secondary | ICD-10-CM

## 2019-10-14 DIAGNOSIS — E785 Hyperlipidemia, unspecified: Secondary | ICD-10-CM

## 2019-10-14 DIAGNOSIS — I2510 Atherosclerotic heart disease of native coronary artery without angina pectoris: Secondary | ICD-10-CM | POA: Diagnosis not present

## 2019-10-14 DIAGNOSIS — Z7189 Other specified counseling: Secondary | ICD-10-CM

## 2019-10-14 DIAGNOSIS — I10 Essential (primary) hypertension: Secondary | ICD-10-CM

## 2019-10-14 MED ORDER — MELOXICAM 15 MG PO TABS: 15.0000 mg | ORAL_TABLET | Freq: Every day | ORAL | 3 refills | Status: DC

## 2019-10-14 NOTE — Patient Instructions (Signed)
Medication Instructions:  Your physician recommends that you continue on your current medications as directed. Please refer to the Current Medication list given to you today.  *If you need a refill on your cardiac medications before your next appointment, please call your pharmacy*  Lab Work: NONE ordered at this time of appointment   If you have labs (blood work) drawn today and your tests are completely normal, you will receive your results only by: MyChart Message (if you have MyChart) OR A paper copy in the mail If you have any lab test that is abnormal or we need to change your treatment, we will call you to review the results.  Testing/Procedures: NONE ordered at this time of appointment   Follow-Up: At [facility], you and your health needs are our priority.  As part of our continuing mission to provide you with exceptional heart care, we have created designated Provider Care Teams.  These Care Teams include your primary Cardiologist (physician) and Advanced Practice Providers (APPs -  Physician Assistants and Nurse Practitioners) who all work together to provide you with the care you need, when you need it.  Your next appointment:   1 year(s)  The format for your next appointment:   In Person  Provider:   James Hochrein, MD  Other Instructions   

## 2019-10-20 ENCOUNTER — Other Ambulatory Visit: Payer: Self-pay

## 2019-10-20 MED ORDER — METOPROLOL TARTRATE 50 MG PO TABS: 50.0000 mg | ORAL_TABLET | Freq: Two times a day (BID) | ORAL | 3 refills | Status: DC

## 2019-10-20 MED ORDER — CHLORTHALIDONE 25 MG PO TABS: 25.0000 mg | ORAL_TABLET | Freq: Every day | ORAL | 3 refills | Status: DC

## 2019-12-21 ENCOUNTER — Telehealth: Payer: Self-pay | Admitting: *Deleted

## 2019-12-21 NOTE — Telephone Encounter (Signed)
Completed phone call/medical chart review for T19,M51 for CLEAR research study. All concomitant medications have been reviewed and updated if applicable. There are no new AE's or SAE's to report to sponsor at this time. Next appointment scheduled for March 2nd, 2022 @ 0800.

## 2020-01-13 ENCOUNTER — Other Ambulatory Visit: Payer: Self-pay

## 2020-01-13 MED ORDER — EZETIMIBE 10 MG PO TABS: 10.0000 mg | ORAL_TABLET | Freq: Every day | ORAL | 3 refills | Status: DC

## 2020-01-17 ENCOUNTER — Other Ambulatory Visit: Payer: Self-pay

## 2020-01-17 MED ORDER — LISINOPRIL 10 MG PO TABS: 10.0000 mg | ORAL_TABLET | Freq: Two times a day (BID) | ORAL | 3 refills | Status: DC

## 2020-01-27 ENCOUNTER — Other Ambulatory Visit: Payer: Self-pay | Admitting: Cardiology

## 2020-03-08 ENCOUNTER — Other Ambulatory Visit: Payer: Self-pay

## 2020-03-08 ENCOUNTER — Encounter: Payer: Medicare Other | Admitting: *Deleted

## 2020-03-08 DIAGNOSIS — Z006 Encounter for examination for normal comparison and control in clinical research program: Secondary | ICD-10-CM

## 2020-03-08 NOTE — Research (Signed)
Subject came in today for CLEAR T20, [place] visit. I've reviewed their concomitant medications and updated any changes if applicable. There are no new AE's or SAE's to report to sponsor at this time. Subject was 100% compliant with IP. New IP was dispensed and next appointment scheduled for Tuesday, August 30th, 2022 @ 0800.

## 2020-05-02 ENCOUNTER — Encounter (HOSPITAL_BASED_OUTPATIENT_CLINIC_OR_DEPARTMENT_OTHER): Payer: Self-pay | Admitting: Nurse Practitioner

## 2020-05-02 ENCOUNTER — Ambulatory Visit (INDEPENDENT_AMBULATORY_CARE_PROVIDER_SITE_OTHER): Payer: Medicare Other | Admitting: Nurse Practitioner

## 2020-05-02 ENCOUNTER — Other Ambulatory Visit: Payer: Self-pay

## 2020-05-02 VITALS — BP 145/60 | HR 64 | Ht 62.0 in | Wt 151.6 lb

## 2020-05-02 DIAGNOSIS — I2510 Atherosclerotic heart disease of native coronary artery without angina pectoris: Secondary | ICD-10-CM

## 2020-05-02 DIAGNOSIS — I10 Essential (primary) hypertension: Secondary | ICD-10-CM | POA: Diagnosis not present

## 2020-05-02 DIAGNOSIS — M159 Polyosteoarthritis, unspecified: Secondary | ICD-10-CM

## 2020-05-02 DIAGNOSIS — K639 Disease of intestine, unspecified: Secondary | ICD-10-CM

## 2020-05-02 DIAGNOSIS — Z7689 Persons encountering health services in other specified circumstances: Secondary | ICD-10-CM

## 2020-05-02 DIAGNOSIS — M8949 Other hypertrophic osteoarthropathy, multiple sites: Secondary | ICD-10-CM

## 2020-05-02 DIAGNOSIS — G5701 Lesion of sciatic nerve, right lower limb: Secondary | ICD-10-CM

## 2020-05-02 DIAGNOSIS — R6889 Other general symptoms and signs: Secondary | ICD-10-CM

## 2020-05-02 DIAGNOSIS — E789 Disorder of lipoprotein metabolism, unspecified: Secondary | ICD-10-CM

## 2020-05-02 DIAGNOSIS — G4700 Insomnia, unspecified: Secondary | ICD-10-CM

## 2020-05-02 MED ORDER — EZETIMIBE 10 MG PO TABS: 10.0000 mg | ORAL_TABLET | Freq: Every day | ORAL | 3 refills | Status: DC

## 2020-05-02 NOTE — Patient Instructions (Addendum)
Recommendations from today's visit:  We will plan to schedule your medicare wellness visit in the next few months. You can call to schedule that at your convenience and we will do labs at that time.   I recommend diclofenac (voltaren) gel for your arthritis pain to your feet. This works best when used every day.   I have included stretches for the piriformis muscle and your upper back. Let me know if these do not help and we can look at other options.   Progressive muscle relaxation can be very helpful for sleep. There are audio's available on youtube.   If you feel your mood is not improving and you would like to speak with someone, please let me know and we can get you set up with counseling services.  ___________________________________________________________________ Thank you for choosing [place] at [facility] for your Primary Care needs. I am excited for the opportunity to partner with you to meet your health care goals. It was a pleasure meeting you today!  I am an Adult-Geriatric Nurse Practitioner with a background in caring for patients for more than 20 years. I received my Paediatric nurse in Nursing and my Doctor of Nursing Practice degrees at [employer]. I received additional fellowship training in primary care and sports medicine after receiving my doctorate degree. I provide primary care and sports medicine services to patients age 50 and older within this office. I am also a provider with the [facility] and the director of the APP Fellowship with [facility].  I am a [place] native, but have called the [place] area home for nearly 20 years and am proud to be a member of this community.   I am passionate about providing the best service to you through preventive medicine and supportive care. I consider you a part of the medical team and value your input. I work diligently to ensure that you are heard and your  needs are met in a safe and effective manner. I want you to feel comfortable with me as your provider and want you to know that your health concerns are important to me.   For your information, our office hours are Monday- Friday 8:00 AM - 5:00 PM At this time I am not in the office on Wednesdays.  If you have questions or concerns, please call our office at [phone] or send [place] a MyChart message and we will respond as quickly as possible.   For all urgent or time sensitive needs we ask that you please call the office to avoid delays. MyChart is not constantly monitored and replies may take up to 72 business hours.  MyChart Policy: . MyChart allows for you to see your visit notes, after visit summary, provider recommendations, lab and tests results, make an appointment, request refills, and contact your provider or the office for non-urgent questions or concerns.  . Providers are seeing patients during normal business hours and do not have built in time to review MyChart messages. We ask that you allow a minimum of 72 business hours for MyChart message responses.  . Complex MyChart concerns may require a visit. Your provider may request you schedule a virtual or in person visit to ensure we are providing the best care possible. . MyChart messages sent after 4:00 PM on Friday will not be received by the provider until Monday morning.    Lab and Test Results: . You will receive your lab and test results  on MyChart as soon as they are completed and results have been sent by the lab or testing facility. Due to this service, you will receive your results BEFORE your provider.  . Please allow a minimum of 72 business hours for your provider to receive and review lab and test results and contact you about.   . Most lab and test result comments from the provider will be sent through [place]. Your provider may recommend changes to the plan of care, follow-up visits, repeat testing, ask questions, or  request an office visit to discuss these results. You may reply directly to this message or call the office at [phone] to provide information for the provider or set up an appointment. . In some instances, you will be called with test results and recommendations. Please let us know if this is preferred and we will make note of this in your chart to provide this for you.    . If you have not heard a response to your lab or test results in 72 business hours, please call the office to let us know.   After Hours: . For all non-emergency after hours needs, please call the office at [phone] and select the option to reach the on-call provider service. On-call services are shared between multiple [place] offices and therefore it will not be possible to speak directly with your provider. On-call providers may provide medical advice and recommendations, but are unable to provide refills for maintenance medications.  . For all emergency or urgent medical needs after normal business hours, we recommend that you seek care at the closest Urgent Care or Emergency Department to ensure appropriate treatment in a timely manner.  [facility] [place] [facility] [place] has a 24 hour emergency room located on the ground floor for your convenience.    Please do not hesitate to reach out to [place] with concerns.   Thank you, again, for choosing me as your health care partner. I appreciate your trust and look forward to learning more about you.   Worthy Keeler, DNP, AGNP-c ___________________________________________________________________________________

## 2020-05-02 NOTE — Progress Notes (Signed)
New Patient Office Visit  Subjective:  Patient ID: Susan Welch, female    DOB: 1945/09/22  Age: 75 y.o. MRN: [id number]  CC:  Chief Complaint  Patient presents with  . Establish Care    HPI Susan Welch is a 75 year old female patient who presents today to establish care.  She has previously received primary care services from: [facility] Her last CPE was: "a while ago" She is also seen by: Cardiology for a tracked study over the past several years. Study is ending in May.   Concerns today: Arthritis Right hip pain Heart Failure Sleep Problems Little Interest in Activities Bowel Urgency  Arthritis She tells me that she has a long history of arthritis pain in her toes bilaterally and she has difficulty walking some days. She has been seen by Dr. Selena Batten with [facility] and tells me that he wanted to do a "knucklectomy" on her toes to help with the pain. She states that she is not interested in this procedure at this time. She has been using biofreeze daily on her feet and she reports this helps. She does tell me that when the pain is exceptionally bad she will take a meloxicam and this helps relieve some of the pain for her.   Right Hip Pain She endorses pain in her right hip that comes and goes. She states is seems to be worse when she has been seated for long periods of time or when she has been lifting objects. She endorses that the pain does radiate down the back of her thigh a little and feels like a burning pain more than sharp or stabbing. She has not taken anything for this or tried anything for this. She denies low back pain, numbness, or tingling.   Heart Failure She has been in a cardiology study for the past several years with monitoring for her heart and cholesterol. She reports that she started the study shortly after she experienced a prolonged hospitalization for her right knee replacement that resulted in her arresting while in surgery. The study  is ending in May and she reports that she will need some one to take hre care over and monitor her blood pressure and lipids. She is compliant on her medication and denies any concerns or side effects. She tells me her labs have been good. She will still see her cardiologist for regular visits. She denies cough, shortness of breath, edema, chest pain, dizziness, or palpitations.   Sleep Problems She reports that she often has difficulty falling asleep at night and she will stay up for several hours listening to audiobooks when she is unable to sleep. She reports that she feels tired, but she is unable to rest. She states that when she does take something over the counter she will sleep for extended periods of time. She endorses a benadryl causing her excessive sleepiness for about 24 hours and melatonin causes increased sleepiness in the morning to the point that her dog will wake her up. She would like to know if there is anything over the counter that she can take to help her fall asleep when she has difficulty without making her sleepy the next day.   Little Interest in Activities She reports a new onset of disinterest or desire to do things with her friends or family. She has a trip in the near future to [place], [place], and [place] and she states that she has told her so to cancel her  reservation, because she has no desire to go. She also reports frequent trips with her friends to lunch that used to bring her a lot of pleasure but she doesn't have interest in this any longer.  She reports the symptoms have been going on for the past 1 to 2 weeks.  She is not sure of anything that has changed in this time however she does feel that she is "in a mood".  She does endorse some depression symptoms however reports today that she is not interested in any medications for this at this time.  She would like to give it a little more time to see if the symptoms are going to resolve on their own or if they continue.  She  does report that she has been in counseling in the past and it was very helpful however she does not feel this is necessary at this time and does not want to start.  Bowel urgency She endorses a consistent problem with urgency of her bowels only in the morning time.  She reports that her bowels are formed but soft and often times she will have the urge to defecate with little to no warning.  She states that many times she is unable to make it to the restroom without passing a small amount of stool.  She states that during the rest of the day her bowel movements are normal.  She typically has 3 bowel movements per day and this only happens with the first bowel movement of the morning.  She reports she follows a very healthy diet and tries to drink only water with lemon and Lactaid milk.  She also eats a variety of fresh vegetables and lean meats.  She reports that she does eat a lot of salad.  She does tell me that her favorite drink is Lactaid and she drinks approximately 1 gallon per week.  This is her main drink with dinner.  She has not tracked her meals or drinks to see if there is anything consistently causing the issues with her bowels but she does find it strange that it only happens first thing in the morning.  She reports she usually does not eat anything after 7 PM.  Narrative: Susan Welch is a very pleasant 75 year old female presenting today to establish care with this practice.  She is an overall good health and performs her own ADLs and still drives.  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  She states that this brings her great joy.   She lives alone.   She has she does endorse former smoking for approximately 25 years 1 pack a day she quit in 2006. She endorses rare use of alcohol only on a social basis with no concerns with excessive drinking.  She has never used recreational drugs. She reports she is not currently  sexually active and she has no concerns today with STI and no history of STIs. She reports that she feels safe in her environment in her home and denies any physical or emotional abuse by family or other contacts. She is up-to-date on her flu shot, her tetanus shot, her pneumonia shot, and her COVID vaccines.  Past Medical History:  Diagnosis Date  . Anemia   . Anxiety    Hx: of situational anxiety  . Arthritis    a. bilat knees s/p R TKA 07/15/2012  . Carotid stenosis    Pre-CABG  Dopplers 07/19/12: Right 40-59%.  . Coronary artery disease    a. s/p VF arrest during TKR 7/14 2/2 NSTEMI => LHC with 3v CAD =>  s/p CABG 07/21/12 (LIMA-LAD, SVG-RI and OM1, SVG-distal RCA).   . Coronary artery disease involving native coronary artery of native heart without angina pectoris 06/17/2016  . DJD (degenerative joint disease) of knee 09/21/2014  . DVT (deep venous thrombosis) ([place]) 07/2012  . GERD (gastroesophageal reflux disease)   . Headache(784.0)    Hx: of migraines  . HTN (hypertension) 09/20/2011  . Hyperlipidemia   . Hypertension   . Ischemic cardiomyopathy    Echo 07/19/12: EF 40-45%, basal to mid anteroseptal AK, distal anteroseptal HK, mild MR.   . Knee joint replacement by other means 07/26/2012  . Osteoarthritis of right knee 07/16/2012  . Panic attacks   . Pneumonia   . Serum potassium elevated 12/14/2015  . Ventricular fibrillation ([place])    a. 07/15/2012 in setting of R TKA  . Wears glasses     Social History   Socioeconomic History  . Marital status: Widowed    Spouse name: Not on file  . Number of children: Not on file  . Years of education: Not on file  . Highest education level: Not on file  Occupational History  . Occupation: Designer, television/film set: [employer]  Tobacco Use  . Smoking status: Former Smoker    Quit date: 01/08/2000    Years since quitting: 20.3  . Smokeless tobacco: Never Used  Vaping Use  . Vaping Use: Never used  Substance and Sexual Activity  .  Alcohol use: Yes    Comment: "occasional wine"  . Drug use: No  . Sexual activity: Not Currently    Birth control/protection: Post-menopausal  Other Topics Concern  . Not on file  Social History Narrative   Marital status: Divorced and widowed.      Children: 2 children; 6 grandchildren.      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       Alcohol: none       Exercise: every other day; waling 2 miles; OA limiting activity.      ADLs: independent with ADLs.   Social Determinants of Health   Financial Resource Strain: Not on file  Food Insecurity: Not on file  Transportation Needs: Not on file  Physical Activity: Not on file  Stress: Not on file  Social Connections: Not on file  Intimate Partner Violence: Not on file    ROS Review of Systems All review of systems negative except what is listed in the HPI  Objective:   Today's Vitals: BP (!) 145/60   Pulse 64   Ht 5\' 2"  (1.575 m)   Wt 151 lb 9.6 oz (68.8 kg)   SpO2 98%   BMI 27.73 kg/m   Physical Exam Vitals and nursing note reviewed.  Constitutional:      Appearance: Normal appearance. She is normal weight.  HENT:     Head: Normocephalic and atraumatic.  Eyes:     Extraocular Movements: Extraocular movements intact.     Conjunctiva/sclera: Conjunctivae normal.     Pupils: Pupils are equal, round, and reactive to light.  Cardiovascular:     Rate and Rhythm: Normal rate and regular rhythm.     Pulses: Normal pulses.     Heart  sounds: Normal heart sounds.  Pulmonary:     Effort: Pulmonary effort is normal.     Breath sounds: Normal breath sounds.  Musculoskeletal:        General: Tenderness present. No swelling. Normal range of motion.     Cervical back: Normal range of motion.     Right lower leg: No swelling. No edema.     Left lower leg: No swelling. No edema.       Legs:  Skin:    General: Skin is warm and  dry.     Capillary Refill: Capillary refill takes less than 2 seconds.  Neurological:     General: No focal deficit present.     Mental Status: She is alert and oriented to person, place, and time.  Psychiatric:        Mood and Affect: Mood normal.        Behavior: Behavior normal.        Thought Content: Thought content normal.        Judgment: Judgment normal.     Assessment & Plan:   Problem List Items Addressed This Visit      Cardiovascular and Mediastinum   HTN (hypertension)    Blood pressure elevated in office today. It is normally well managed- possibly due to increased anxiety over new visit. Recommend monitoring at home and if it continues to run over 130/80, inform the office for evaluation.  No changes to medication today.       Relevant Medications   ezetimibe (ZETIA) 10 MG tablet   Coronary artery disease involving native coronary artery of native heart without angina pectoris    Long standing history of CAD. Currently on Zetia and medication for blood pressure control. Managed by cardiology at this time. Will defer care to PCP after cardiology study ends in May of this year.  No changes today.       Relevant Medications   ezetimibe (ZETIA) 10 MG tablet     Digestive   Bowel trouble    Urge incontinence of bowels only occurring with first BM of the morning. Based on patients description, this may be due to something in the diet or possibly medication that is being taken at night.  Recommend that she keep a food diary to monitor for foods that seem to exacerbate the issue and we will discuss at follow-up visit.  She does endorse drinking large amounts of lactaid, especially in the evening. This may be a contributor, but will wait for diary results to make determination.  No alarm signs present.         Nervous and Auditory   Piriformis syndrome of right side    Symptoms and presentation consistent with piriformis syndrome of the right hip. Educated patient on  the condition and causes. Recommendations for stretching and exercise provided. 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 If symptoms not improved with conservative measures, will send for PT.         Musculoskeletal and Integument   Primary osteoarthritis involving multiple joints    Symptoms and presentation consistent with osteoarthritis. Recommend trial of diclofenac gel to joints daily to see if this is helpful for her pain. She does not like taking oral NSAIDs on a regular basis and this is likely not a great option for her blood pressure, therefore the hope is that this will provide relief without  the need for additional intervention.         Other   Lipid disorder    Currently on zetia as she is statin intolerant. Last labs WNL. She is seen in cardiology research study. No changes today.       Relevant Medications   ezetimibe (ZETIA) 10 MG tablet   Encounter to establish care - Primary    Review of current and past medical history, surgical history, medications, family history, allergies, and SDOH performed today.  She is due for medicare wellness. Will plan this visit in the summer and get labs at that time.  Will review care gaps and make recommendations for care at that time.       Insomnia    Difficulty falling asleep.  Discussed proper sleep hygiene and recommendations for bedtime routine, no stimulation at least 30 minutes prior to bed, progressive muscle relaxation, and use of camomile or lavender to help her relax. Discussed dosages of melatonin and recommendation for use.  She may benefit from hydroxyzine as needed given her change in mood recently to also help with sleep. Will consider low dosage of this if other measures are not effective.       Lack of interest    Symptoms consistent with depressive disorder, but have only been about 2 weeks. It is unclear what could have  triggered the sudden change in her mood. She is not interested in medication today, but knows that we can discuss this if she changes her mind. She is not interested in counseling today- I do feel that this may be helpful for her, but she does know that if her symptoms do not improve we can get that scheduled for her.  No SI/HI or self injury present today. Symptoms do seem mild and I am hopeful that with the change in the weather she will begin to feel better sooner. I did encourage her to reconsider her trip given that she will be going with family and have social support and interaction while there.          Outpatient Encounter Medications as of 05/02/2020  Medication Sig  . AMBULATORY NON FORMULARY MEDICATION Take 180 mg by mouth daily. Medication Name: Bempedoic acid 180 mgs vs placebo, CLEAR research study, drug provided  . aspirin 81 MG tablet Take 81 mg by mouth daily.  . calcium citrate-vitamin D (CITRACAL+D) 315-200 MG-UNIT per tablet Take 1 tablet by mouth daily.  . chlorthalidone (HYGROTON) 25 MG tablet Take 1 tablet (25 mg total) by mouth daily.  . Cholecalciferol (VITAMIN D) 2000 UNITS tablet Take 1 tablet (2,000 Units total) by mouth daily.  . Coenzyme Q10 (CO Q10) 200 MG CAPS Take 200 mg by mouth daily.  Marland Kitchen ezetimibe (ZETIA) 10 MG tablet Take 1 tablet (10 mg total) by mouth daily.  . folic acid ([employer] FOLIC ACID) 767 MCG tablet Take by mouth daily.   Marland Kitchen lisinopril (ZESTRIL) 10 MG tablet Take 1 tablet (10 mg total) by mouth 2 (two) times daily.  . meloxicam (MOBIC) 15 MG tablet Take 1 tablet (15 mg total) by mouth daily.  . metoprolol tartrate (LOPRESSOR) 50 MG tablet Take 1 tablet (50 mg total) by mouth 2 (two) times daily.  . [DISCONTINUED] ezetimibe (ZETIA) 10 MG tablet Take 1 tablet (10 mg total) by mouth daily.   No facility-administered encounter medications on file as of 05/02/2020.   Time: 54 minutes, >50% spent counseling, care coordination, chart review, and documentation.    Follow-up: Return  for medicare wellness in next few months.   Orma Render, NP

## 2020-05-03 DIAGNOSIS — Z0000 Encounter for general adult medical examination without abnormal findings: Secondary | ICD-10-CM | POA: Insufficient documentation

## 2020-05-03 DIAGNOSIS — R6889 Other general symptoms and signs: Secondary | ICD-10-CM

## 2020-05-03 DIAGNOSIS — G5701 Lesion of sciatic nerve, right lower limb: Secondary | ICD-10-CM

## 2020-05-03 DIAGNOSIS — K639 Disease of intestine, unspecified: Secondary | ICD-10-CM | POA: Insufficient documentation

## 2020-05-03 DIAGNOSIS — G4700 Insomnia, unspecified: Secondary | ICD-10-CM | POA: Insufficient documentation

## 2020-05-03 DIAGNOSIS — Z7689 Persons encountering health services in other specified circumstances: Secondary | ICD-10-CM | POA: Insufficient documentation

## 2020-05-03 DIAGNOSIS — M159 Polyosteoarthritis, unspecified: Secondary | ICD-10-CM | POA: Insufficient documentation

## 2020-05-03 DIAGNOSIS — M8949 Other hypertrophic osteoarthropathy, multiple sites: Secondary | ICD-10-CM | POA: Insufficient documentation

## 2020-05-03 HISTORY — DX: Other general symptoms and signs: R68.89

## 2020-05-03 HISTORY — DX: Persons encountering health services in other specified circumstances: Z76.89

## 2020-05-03 HISTORY — DX: Lesion of sciatic nerve, right lower limb: G57.01

## 2020-05-03 NOTE — Assessment & Plan Note (Signed)
Long standing history of CAD. Currently on Zetia and medication for blood pressure control. Managed by cardiology at this time. Will defer care to PCP after cardiology study ends in May of this year.  No changes today.

## 2020-05-03 NOTE — Assessment & Plan Note (Signed)
>>  ASSESSMENT AND PLAN FOR LIPID DISORDER WRITTEN ON 05/03/2020  5:46 PM BY Taiki Buckwalter E, NP  Currently on zetia as she is statin intolerant. Last labs WNL. She is seen in cardiology research study. No changes today.

## 2020-05-03 NOTE — Assessment & Plan Note (Signed)
Symptoms and presentation consistent with osteoarthritis. Recommend trial of diclofenac gel to joints daily to see if this is helpful for her pain. She does not like taking oral NSAIDs on a regular basis and this is likely not a great option for her blood pressure, therefore the hope is that this will provide relief without the need for additional intervention.

## 2020-05-03 NOTE — Assessment & Plan Note (Signed)
Difficulty falling asleep.  Discussed proper sleep hygiene and recommendations for bedtime routine, no stimulation at least 30 minutes prior to bed, progressive muscle relaxation, and use of camomile or lavender to help her relax. Discussed dosages of melatonin and recommendation for use.  She may benefit from hydroxyzine as needed given her change in mood recently to also help with sleep. Will consider low dosage of this if other measures are not effective.

## 2020-05-03 NOTE — Assessment & Plan Note (Signed)
Symptoms and presentation consistent with piriformis syndrome of the right hip. Educated patient on the condition and causes. Recommendations for stretching and exercise provided. 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 If symptoms not improved with conservative measures, will send for PT.

## 2020-05-03 NOTE — Assessment & Plan Note (Addendum)
Symptoms consistent with depressive disorder, but have only been about 2 weeks. It is unclear what could have triggered the sudden change in her mood. She is not interested in medication today, but knows that we can discuss this if she changes her mind. She is not interested in counseling today- I do feel that this may be helpful for her, but she does know that if her symptoms do not improve we can get that scheduled for her.  No SI/HI or self injury present today. Symptoms do seem mild and I am hopeful that with the change in the weather she will begin to feel better sooner. I did encourage her to reconsider her trip given that she will be going with family and have social support and interaction while there.

## 2020-05-03 NOTE — Assessment & Plan Note (Signed)
Urge incontinence of bowels only occurring with first BM of the morning. Based on patients description, this may be due to something in the diet or possibly medication that is being taken at night.  Recommend that she keep a food diary to monitor for foods that seem to exacerbate the issue and we will discuss at follow-up visit.  She does endorse drinking large amounts of lactaid, especially in the evening. This may be a contributor, but will wait for diary results to make determination.  No alarm signs present.

## 2020-05-03 NOTE — Assessment & Plan Note (Signed)
Review of current and past medical history, surgical history, medications, family history, allergies, and SDOH performed today.  She is due for medicare wellness. Will plan this visit in the summer and get labs at that time.  Will review care gaps and make recommendations for care at that time.

## 2020-05-03 NOTE — Assessment & Plan Note (Signed)
Blood pressure elevated in office today. It is normally well managed- possibly due to increased anxiety over new visit. Recommend monitoring at home and if it continues to run over 130/80, inform the office for evaluation.  No changes to medication today.

## 2020-05-03 NOTE — Assessment & Plan Note (Signed)
Currently on zetia as she is statin intolerant. Last labs WNL. She is seen in cardiology research study. No changes today.

## 2020-05-23 ENCOUNTER — Other Ambulatory Visit: Payer: Self-pay

## 2020-05-23 ENCOUNTER — Encounter: Payer: Medicare Other | Admitting: *Deleted

## 2020-05-23 DIAGNOSIS — Z006 Encounter for examination for normal comparison and control in clinical research program: Secondary | ICD-10-CM

## 2020-05-23 NOTE — Research (Signed)
Patient came into the research clinic today for the End of Study visit for the CLEAR research trial. Subject's concomitant medications have been reviewed and updated if applicable. There are no new AE's or SAE's to report to sponsor at this time. During the visit the subject had an EKG performed and the physical assessment completed by the Sub-I of the study. Subject was 100% compliant with IP since last visit. Subject was reminded that they will receive a telephone call from me in approximately 30 days from today.

## 2020-06-20 ENCOUNTER — Encounter: Payer: Self-pay | Admitting: *Deleted

## 2020-06-20 ENCOUNTER — Encounter: Payer: Medicare Other | Admitting: *Deleted

## 2020-06-20 ENCOUNTER — Other Ambulatory Visit: Payer: Self-pay

## 2020-06-20 DIAGNOSIS — Z006 Encounter for examination for normal comparison and control in clinical research program: Secondary | ICD-10-CM

## 2020-06-20 NOTE — Research (Signed)
Patient came into the research clinic today for unscheduled visit to re-draw blood due to high calcium level.  All concomitant medications reviewed and updated. There are no new AE's or SAE's to report at this time.  Will call patient with results.

## 2020-06-20 NOTE — Progress Notes (Addendum)
       Screening Run-In Abnormal Lab Results - Subject S503  Lipids: Apolipoprotein B48 14.85 mg/dL       [x]  Clinically Significant     [] Not Clinically Significant    Pixie Casino, MD, [facility], [place] Director of the Advanced Lipid Disorders &  Cardiovascular Risk Reduction Clinic Diplomate of the American Board of Clinical Lipidology Attending Cardiologist  Direct Dial: [phone]  Fax: [phone]  Website:  [email or web address]

## 2020-06-21 LAB — BASIC METABOLIC PANEL
BUN/Creatinine Ratio: 28 (ref 12–28)
BUN: 30 mg/dL — ABNORMAL HIGH (ref 8–27)
CO2: 20 mmol/L (ref 20–29)
Calcium: 9.7 mg/dL (ref 8.7–10.3)
Chloride: 101 mmol/L (ref 96–106)
Creatinine, Ser: 1.07 mg/dL — ABNORMAL HIGH (ref 0.57–1.00)
Glucose: 98 mg/dL (ref 65–99)
Potassium: 4 mmol/L (ref 3.5–5.2)
Sodium: 138 mmol/L (ref 134–144)
eGFR: 55 mL/min/{1.73_m2} — ABNORMAL LOW (ref >59)

## 2020-06-27 NOTE — Addendum Note (Signed)
Addended by: Sandie Ano D on: 06/27/2020 10:08 AM   Modules accepted: Orders

## 2020-06-30 ENCOUNTER — Other Ambulatory Visit: Payer: Self-pay | Admitting: *Deleted

## 2020-06-30 ENCOUNTER — Encounter: Payer: Self-pay | Admitting: *Deleted

## 2020-06-30 DIAGNOSIS — E785 Hyperlipidemia, unspecified: Secondary | ICD-10-CM

## 2020-07-04 ENCOUNTER — Ambulatory Visit (HOSPITAL_BASED_OUTPATIENT_CLINIC_OR_DEPARTMENT_OTHER): Payer: Medicare Other | Admitting: Nurse Practitioner

## 2020-07-19 DIAGNOSIS — E785 Hyperlipidemia, unspecified: Secondary | ICD-10-CM | POA: Diagnosis not present

## 2020-07-19 LAB — LIPID PANEL
Chol/HDL Ratio: 4.1 ratio (ref 0.0–4.4)
Cholesterol, Total: 256 mg/dL — ABNORMAL HIGH (ref 100–199)
HDL: 62 mg/dL
LDL Chol Calc (NIH): 168 mg/dL — ABNORMAL HIGH (ref 0–99)
Triglycerides: 145 mg/dL (ref 0–149)
VLDL Cholesterol Cal: 26 mg/dL (ref 5–40)

## 2020-07-21 ENCOUNTER — Other Ambulatory Visit: Payer: Self-pay | Admitting: *Deleted

## 2020-07-21 DIAGNOSIS — Z006 Encounter for examination for normal comparison and control in clinical research program: Secondary | ICD-10-CM

## 2020-07-21 DIAGNOSIS — E785 Hyperlipidemia, unspecified: Secondary | ICD-10-CM

## 2020-07-27 ENCOUNTER — Other Ambulatory Visit: Payer: Self-pay

## 2020-07-27 ENCOUNTER — Ambulatory Visit (INDEPENDENT_AMBULATORY_CARE_PROVIDER_SITE_OTHER): Payer: Medicare Other | Admitting: Pharmacist Clinician (PhC)/ Clinical Pharmacy Specialist

## 2020-07-27 VITALS — BP 128/72 | HR 65 | Resp 14 | Ht 62.0 in | Wt 151.8 lb

## 2020-07-27 DIAGNOSIS — M791 Myalgia, unspecified site: Secondary | ICD-10-CM

## 2020-07-27 DIAGNOSIS — E785 Hyperlipidemia, unspecified: Secondary | ICD-10-CM | POA: Diagnosis not present

## 2020-07-27 NOTE — Patient Instructions (Addendum)
Your Results:             Your most recent labs Goal  Total Cholesterol 256 < 200  Triglycerides 145 < 150  HDL (happy/good cholesterol) 62 > 40  LDL (lousy/bad cholesterol 168 < 70     Medication changes:  We will start the process to get Repatha (or Praluent) covered by your insurance company.  [facility] Silos will give you a call once approved.  You will need to do one injection every 14 days.   Lab orders:  We will have you repeat cholesterol labs in about 2-3 moths.    Patient Assistance:  The [employer] foundation offers assistance to help pay for medication copays.  They will cover copays for all cholesterol lowering meds, including statins, fibrates, omega-3 oils, ezetimibe, Repatha, Praluent, Nexletol, Nexlizet.  The cards are usually good for $2,500 or 12 months, whichever comes first. Go to healthwellfoundation.org Click on "Apply Now" Answer questions as to whom is applying (patient or representative) Your disease fund will be "hypercholesterolemia - Medicare access" They will ask questions about finances and which medications you are taking for cholesterol When you submit, the approval is usually within minutes.  You will need to print the card information from the site You will need to show this information to your pharmacy, they will bill your Medicare Part D plan first -then bill [employer] --for the copay.   You can also call them at [phone], although the hold times can be quite long.   You can also try the [place] (panfoundation.[email or web address]).    Thank you for choosing [facility]

## 2020-07-27 NOTE — Progress Notes (Signed)
09/18/2020 Susan Welch Jan 29, 1945 [id number]   HPI:  Susan Welch is a 75 y.o. female patient of Dr Percival Spanish, who presents today for a lipid clinic evaluation.  See pertinent past medical history below.  She was enrolled in the CLEAR research trial, which has recently ended, and she was asked to be sure and follow up with our office.  Labs drawn after exiting the trial showed her LDL at 168.      Past Medical History: CAD S/P CABG x 3 (2014) after V fib arrest - during knee replacement surgery  hypertension On chlorthalidone, lisinopril, metoprolol  DVT 2014 - while in rehab after CABG/TKA    Current Medications: ezetimibe 10 mg qd  Cholesterol Goals: LDL < 70   Intolerant/previously tried: pravastatin, atorvastatin, rosuvastatin - all caused myalgias  Family history: children - son deceased with high cholesterol, younger son sees [facility], husband died at 26  Diet: home cooked, low sodium low fat; has been trying to stay on Mediterranean diet recently  Exercise:  occasional treadmill use, does walk small dog daily  Labs:   7/22TC 256, TG 145, HDL 62, LDL 168   Current Outpatient Medications  Medication Sig Dispense Refill   aspirin 81 MG tablet Take 81 mg by mouth daily.     calcium citrate-vitamin D (CITRACAL+D) 315-200 MG-UNIT per tablet Take 1 tablet by mouth daily.     Cholecalciferol (VITAMIN D) 2000 UNITS tablet Take 1 tablet (2,000 Units total) by mouth daily. 30 tablet 0   Coenzyme Q10 (CO Q10) 200 MG CAPS Take 200 mg by mouth daily. 30 capsule 0   ezetimibe (ZETIA) 10 MG tablet Take 1 tablet (10 mg total) by mouth daily. 90 tablet 3   folic acid (FOLVITE) 283 MCG tablet Take by mouth daily.      lisinopril (ZESTRIL) 10 MG tablet Take 1 tablet (10 mg total) by mouth 2 (two) times daily. 180 tablet 3   loratadine (CLARITIN) 10 MG tablet Take 10 mg by mouth daily.     meloxicam (MOBIC) 15 MG tablet Take 1 tablet (15 mg total) by mouth daily. 30 tablet 3   metoprolol  tartrate (LOPRESSOR) 50 MG tablet Take 1 tablet (50 mg total) by mouth 2 (two) times daily. 180 tablet 3   omeprazole (PRILOSEC) 10 MG capsule Take 10 mg by mouth daily.     chlorthalidone (HYGROTON) 25 MG tablet Take 1 tablet (25 mg total) by mouth daily. 90 tablet 0   Evolocumab (REPATHA SURECLICK) 151 MG/ML SOAJ Inject 140 mg into the skin every 14 (fourteen) days. 2 mL 11   mometasone (ELOCON) 0.1 % cream Apply small amount to skin lesions once a day as needed. 45 g 5   No current facility-administered medications for this visit.    Allergies  Allergen Reactions   Oxycodone Other (See Comments)    Flu like symptoms.   Crestor [Rosuvastatin]     myalgias   Latex    Pravastatin     myaglias   Statins Other (See Comments)    Joint pain with Lipitor daily, Pravastatin 10 mg qd and Pravastatin 10 mg M/W/F, Crestor 10 mg once weekly also caused joint aches   Nickel Rash    Past Medical History:  Diagnosis Date   Anemia    Anxiety    Hx: of situational anxiety   Arthritis    a. bilat knees s/p R TKA 07/15/2012   Carotid stenosis    Pre-CABG Dopplers 07/19/12: Right 40-59%.  Coronary artery disease    a. s/p VF arrest during TKR 7/14 2/2 NSTEMI => LHC with 3v CAD =>  s/p CABG 07/21/12 (LIMA-LAD, SVG-RI and OM1, SVG-distal RCA).    Coronary artery disease involving native coronary artery of native heart without angina pectoris 06/17/2016   DJD (degenerative joint disease) of knee 09/21/2014   DVT (deep venous thrombosis) ([place]) 07/2012   GERD (gastroesophageal reflux disease)    Headache(784.0)    Hx: of migraines   HTN (hypertension) 09/20/2011   Hyperlipidemia    Hypertension    Ischemic cardiomyopathy    Echo 07/19/12: EF 40-45%, basal to mid anteroseptal AK, distal anteroseptal HK, mild MR.    Knee joint replacement by other means 07/26/2012   Osteoarthritis of right knee 07/16/2012   Panic attacks    Pneumonia    Serum potassium elevated 12/14/2015   Ventricular fibrillation  ([place])    a. 07/15/2012 in setting of R TKA   Wears glasses     Blood pressure 128/72, pulse 65, resp. rate 14, height 5\' 2"  (1.575 m), weight 151 lb 12.8 oz (68.9 kg), SpO2 94 %.   Dyslipidemia Patient with ASCVD and LDL not currently at goal.  She was recently released from the CLEAR trial and needs further lipid management.  Reviewed options for lowering LDL cholesterol, including  PCSK-9 inhibitors, bempedoic acid and inclisiran.  Discussed mechanisms of action, dosing, side effects and potential decreases in LDL cholesterol.  Also reviewed cost information and potential options for patient assistance.  Answered all patient questions.  Based on this information, patient would prefer to start PCSK-9 inhibitor.  Will have [facility] start paperwork to get PA approved.   Once completed, she will need to take Repatha 140 mg q14 d and repeat lipid labs after 4-5 doses.     Tommy Medal PharmD CPP [facility] [address] [place], [place] [address] [phone]

## 2020-07-31 ENCOUNTER — Telehealth: Payer: Self-pay

## 2020-07-31 DIAGNOSIS — E785 Hyperlipidemia, unspecified: Secondary | ICD-10-CM

## 2020-07-31 MED ORDER — REPATHA SURECLICK 140 MG/ML ~~LOC~~ SOAJ: 140.0000 mg | SUBCUTANEOUS | 11 refills | Status: DC

## 2020-07-31 NOTE — Telephone Encounter (Signed)
CALLED AND SPOKE W/PT REGARDING THE APPROVAL OF THE REPATHA, RX SENT, PT INSTRUCTED TO COMPLETE FASTING LIPIDS POST 4TH DOSE, AND TO CALL IF MED IS TO UNAFFORDABLE PT VOICED UNDERSTANDING

## 2020-07-31 NOTE — Telephone Encounter (Signed)
RELEASED ORDERS

## 2020-07-31 NOTE — Addendum Note (Signed)
Addended by: Allean Found on: 07/31/2020 10:57 AM   Modules accepted: Orders

## 2020-08-03 ENCOUNTER — Ambulatory Visit (INDEPENDENT_AMBULATORY_CARE_PROVIDER_SITE_OTHER): Payer: Medicare Other | Admitting: Nurse Practitioner

## 2020-08-03 ENCOUNTER — Encounter (HOSPITAL_BASED_OUTPATIENT_CLINIC_OR_DEPARTMENT_OTHER): Payer: Self-pay | Admitting: Nurse Practitioner

## 2020-08-03 ENCOUNTER — Other Ambulatory Visit: Payer: Self-pay

## 2020-08-03 VITALS — HR 59 | Ht 62.0 in | Wt 150.4 lb

## 2020-08-03 DIAGNOSIS — Z780 Asymptomatic menopausal state: Secondary | ICD-10-CM

## 2020-08-03 DIAGNOSIS — Z13820 Encounter for screening for osteoporosis: Secondary | ICD-10-CM | POA: Diagnosis not present

## 2020-08-03 DIAGNOSIS — Z1211 Encounter for screening for malignant neoplasm of colon: Secondary | ICD-10-CM

## 2020-08-03 DIAGNOSIS — Z0000 Encounter for general adult medical examination without abnormal findings: Secondary | ICD-10-CM

## 2020-08-03 DIAGNOSIS — Z23 Encounter for immunization: Secondary | ICD-10-CM

## 2020-08-03 MED ORDER — MOMETASONE FUROATE 0.1 % EX CREA: TOPICAL_CREAM | CUTANEOUS | 5 refills | Status: DC

## 2020-08-03 MED ORDER — ZOSTER VAC RECOMB ADJUVANTED 50 MCG/0.5ML IM SUSR: 0.5000 mL | Freq: Once | INTRAMUSCULAR | 1 refills | Status: AC

## 2020-08-03 NOTE — Progress Notes (Signed)
[facility] [address]  [place], [place]  [address] [place] VISIT  08/03/2020  Subjective:  Susan Welch is a 75 y.o. female patient of Fusako Tanabe, Coralee Pesa, NP who had a Medicare Annual Wellness Visit today. Susan Welch is Retired and lives alone. Susan Welch has 1 child, a son, that lives locally. Susan Welch has another son who has passed away. . Susan Welch reports that Susan Welch is socially active and does interact with friends/family regularly. Susan Welch is markedly physically active and enjoys walking.  Patient Care Team: Adaira Centola, Coralee Pesa, NP as PCP - General (Nurse Practitioner) Minus Breeding, MD as PCP - Cardiology (Cardiology) Berle Mull, MD as Attending Physician (Family Medicine) [name], MD (Inactive) as Consulting Physician (Orthopedic Surgery) Minus Breeding, MD as Consulting Physician (Cardiology) Darlyne Russian, MD (Emergency Medicine)  Advanced Directives 08/03/2020 09/08/2014 07/24/2012 07/20/2012 07/16/2012 07/07/2012  Does Patient Have a Medical Advance Directive? Yes No Patient has advance directive, copy not in chart Patient does not have advance directive Patient does not have advance directive Patient does not have advance directive;Patient would like information  Type of Advance Directive [place] will - - -  Does patient want to make changes to medical advance directive? No - Patient declined - - - - -  Copy of [place] in Chart? No - copy requested - Copy requested from family - - -  Would patient like information on creating a medical advance directive? - Yes - Scientist, clinical (histocompatibility and immunogenetics) given - Advance directive packet given Advance directive packet given Advance directive packet given  Pre-existing out of facility DNR order (yellow form or pink MOST form) - - No No No -    Hospital Utilization Over the Past 12 Months: # of  hospitalizations or ER visits: 0 # of surgeries: 0  Review of Systems    Patient reports that her overall health is unchanged when compared to last year.  Review of Systems: Negative except chronic arthritis in feet bilaterally.   All other systems negative.  Pain Assessment Pain : 0-10 Pain Score: 4  Pain Type: Chronic pain Pain Location: Foot (arthritis) Pain Orientation: Right, Left Pain Descriptors / Indicators: Aching Pain Onset: More than a month ago Pain Frequency: Intermittent Pain Relieving Factors: Tylenol, NSAIDs Effect of Pain on Daily Activities: Unable to walk more than 4 miles in a day  Pain Relieving Factors: Tylenol, NSAIDs  Current Medications & Allergies (verified) Allergies as of 08/03/2020       Reactions   Oxycodone Other (See Comments)   Flu like symptoms.   Crestor [rosuvastatin]    myalgias   Latex    Pravastatin    myaglias   Statins Other (See Comments)   Joint pain with Lipitor daily, Pravastatin 10 mg qd and Pravastatin 10 mg M/W/F, Crestor 10 mg once weekly also caused joint aches   Nickel Rash        Medication List        Accurate as of August 03, 2020 12:39 PM. If you have any questions, ask your nurse or doctor.          aspirin 81 MG tablet Take 81 mg by mouth daily.   calcium citrate-vitamin D 315-200 MG-UNIT tablet Commonly known as: CITRACAL+D Take 1 tablet by mouth daily.   chlorthalidone 25 MG tablet Commonly known as: HYGROTON Take 1 tablet (25 mg total) by mouth  daily.   Co Q10 200 MG Caps Take 200 mg by mouth daily.   ezetimibe 10 MG tablet Commonly known as: ZETIA Take 1 tablet (10 mg total) by mouth daily.   folic acid A999333 MCG tablet Commonly known as: FOLVITE Take by mouth daily.   lisinopril 10 MG tablet Commonly known as: ZESTRIL Take 1 tablet (10 mg total) by mouth 2 (two) times daily.   loratadine 10 MG tablet Commonly known as: CLARITIN Take 10 mg by mouth daily.   meloxicam 15 MG  tablet Commonly known as: MOBIC Take 1 tablet (15 mg total) by mouth daily.   metoprolol tartrate 50 MG tablet Commonly known as: LOPRESSOR Take 1 tablet (50 mg total) by mouth 2 (two) times daily.   mometasone 0.1 % cream Commonly known as: ELOCON Apply small amount to skin lesions once a day as needed. Started by: Orma Render, NP   omeprazole 10 MG capsule Commonly known as: PRILOSEC Take 10 mg by mouth daily.   Repatha SureClick [id number] MG/ML Soaj Generic drug: Evolocumab Inject 140 mg into the skin every 14 (fourteen) days.   Vitamin D 50 MCG (2000 UT) tablet Take 1 tablet (2,000 Units total) by mouth daily.   Zoster Vaccine Adjuvanted injection Commonly known as: SHINGRIX Inject 0.5 mLs into the muscle once for 1 dose. Repeat in 2-6 months. Please fax confirmation of vaccination to Worthy Keeler, DNP at [phone] Started by: Orma Render, NP        History (reviewed): Past Medical History:  Diagnosis Date   Anemia    Anxiety    Hx: of situational anxiety   Arthritis    a. bilat knees s/p R TKA 07/15/2012   Carotid stenosis    Pre-CABG Dopplers 07/19/12: Right 40-59%.   Coronary artery disease    a. s/p VF arrest during TKR 7/14 2/2 NSTEMI => LHC with 3v CAD =>  s/p CABG 07/21/12 (LIMA-LAD, SVG-RI and OM1, SVG-distal RCA).    Coronary artery disease involving native coronary artery of native heart without angina pectoris 06/17/2016   DJD (degenerative joint disease) of knee 09/21/2014   DVT (deep venous thrombosis) ([place]) 07/2012   GERD (gastroesophageal reflux disease)    Headache(784.0)    Hx: of migraines   HTN (hypertension) 09/20/2011   Hyperlipidemia    Hypertension    Ischemic cardiomyopathy    Echo 07/19/12: EF 40-45%, basal to mid anteroseptal AK, distal anteroseptal HK, mild MR.    Knee joint replacement by other means 07/26/2012   Osteoarthritis of right knee 07/16/2012   Panic attacks    Pneumonia    Serum potassium elevated 12/14/2015   Ventricular  fibrillation ([place])    a. 07/15/2012 in setting of R TKA   Wears glasses    Past Surgical History:  Procedure Laterality Date   ABDOMINAL HYSTERECTOMY     CORONARY ARTERY BYPASS GRAFT N/A 07/21/2012   Procedure: CORONARY ARTERY BYPASS GRAFTING (CABG);  Surgeon: Grace Isaac, MD;  Location: [facility];  Service: Open Heart Surgery;  Laterality: N/A;  Times 4 using left internal mammary artery and endoscopically harvested left saphenous vein.   DILATION AND CURETTAGE OF UTERUS     INTRAOPERATIVE TRANSESOPHAGEAL ECHOCARDIOGRAM N/A 07/21/2012   Procedure: INTRAOPERATIVE TRANSESOPHAGEAL ECHOCARDIOGRAM;  Surgeon: Grace Isaac, MD;  Location: [facility];  Service: Open Heart Surgery;  Laterality: N/A;   LEFT HEART CATHETERIZATION WITH CORONARY ANGIOGRAM N/A 07/17/2012   Procedure: LEFT HEART CATHETERIZATION WITH CORONARY ANGIOGRAM;  Surgeon: Jeneen Rinks  Hochrein, MD;  Location: [facility];  Service: Cardiovascular;  Laterality: N/A;   TOTAL KNEE ARTHROPLASTY Right 07/15/2012   Procedure: TOTAL KNEE ARTHROPLASTY;  Surgeon: [name], MD;  Location: [facility];  Service: Orthopedics;  Laterality: Right;  drapes pulled back to provide cpr, new drape applied, protocol followed   TOTAL KNEE ARTHROPLASTY Left 09/21/2014   Procedure: TOTAL LEFT KNEE ARTHROPLASTY;  Surgeon: [name], MD;  Location: [facility];  Service: Orthopedics;  Laterality: Left;   Family History  Problem Relation Age of Onset   Thyroid disease Mother        has thyroid removed   Colitis Mother    COPD Sister    Social History   Socioeconomic History   Marital status: Widowed    Spouse name: Not on file   Number of children: Not on file   Years of education: Not on file   Highest education level: Not on file  Occupational History   Occupation: Chiropractor    Employer: [employer]  Tobacco Use   Smoking status: Former    Types: Cigarettes    Quit date: 01/08/2000    Years since quitting: 20.5   Smokeless tobacco: Never   Vaping Use   Vaping Use: Never used  Substance and Sexual Activity   Alcohol use: Yes    Comment: "occasional wine"   Drug use: No   Sexual activity: Not Currently    Birth control/protection: Post-menopausal  Other Topics Concern   Not on file  Social History Narrative   Marital status: Divorced and widowed.      Children: 2 children; 6 grandchildren.      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       Alcohol: none       Exercise: every other day; waling 2 miles; OA limiting activity.      ADLs: independent with ADLs.   Social Determinants of Health   Financial Resource Strain: Low Risk    Difficulty of Paying Living Expenses: Not hard at all  Food Insecurity: No Food Insecurity   Worried About Charity fundraiser in the Last Year: Never true   [place] in the Last Year: Never true  Transportation Needs: No Transportation Needs   Lack of Transportation (Medical): No   Lack of Transportation (Non-Medical): No  Physical Activity: Sufficiently Active   Days of Exercise per Week: 7 days   Minutes of Exercise per Session: 60 min  Stress: No Stress Concern Present   Feeling of Stress : Not at all  Social Connections: Moderately Integrated   Frequency of Communication with Friends and Family: More than three times a week   Frequency of Social Gatherings with Friends and Family: More than three times a week   Attends Religious Services: More than 4 times per year   Active Member of [employer] or Organizations: Yes   Attends Archivist Meetings: More than 4 times per year   Marital Status: Divorced    Activities of Daily Living In your present state of health, do you have any difficulty performing the following activities: 08/03/2020  Hearing? N  Vision? N  Difficulty concentrating or making decisions? N  Walking or climbing stairs? N  Dressing or bathing? N   Doing errands, shopping? N  Preparing Food and eating ? N  Using the Toilet? N  In the past six months, have you accidently leaked urine? N  Do you have problems with loss of bowel control? N  Managing your Medications? N  Managing your Finances? N  Housekeeping or managing your Housekeeping? N  Some recent data might be hidden    Patient Education/Literacy How often do you need to have someone help you when you read instructions, pamphlets, or other written materials from your doctor or pharmacy?: 1 - Never  Exercise Current Exercise Habits: Home exercise routine, Type of exercise: walking, Time (Minutes): > 60, Frequency (Times/Week): 7, Weekly Exercise (Minutes/Week): 0, Intensity: Moderate, Exercise limited by: orthopedic condition(s)  Diet Patient reports consuming 2 meals a day and 1 snack(s) a day Patient reports that her primary diet is: Low fat Patient reports that Susan Welch does have regular access to food.   Depression Screen PHQ 2/9 Scores 08/03/2020 05/02/2020 05/15/2016 08/01/2014 09/09/2012  PHQ - 2 Score 0 2 0 0 0  PHQ- 9 Score - 4 - - -     Fall Risk Fall Risk  08/03/2020 05/02/2020 05/15/2016 08/01/2014  Falls in the past year? 0 1 Yes Yes  Number falls in past yr: 0 0 1 2 or more  Injury with Fall? 0 - No Yes  Risk Factor Category  - - - High Fall Risk  Risk for fall due to : No Fall Risks No Fall Risks - -  Follow up Falls evaluation completed;Falls prevention discussed - - -     Objective:   Pulse (!) 59   Ht '5\' 2"'$  (1.575 m)   Wt 150 lb 6.4 oz (68.2 kg)   SpO2 96%   BMI 27.51 kg/m   Last Weight  Most recent update: 08/03/2020 10:56 AM    Weight  68.2 kg (150 lb 6.4 oz)             Body mass index is 27.51 kg/m.  Hearing/Vision  Susan Welch did not have difficulty with hearing/understanding during the face-to-face interview Susan Welch did not have difficulty with her vision during the face-to-face interview Reports that Susan Welch has had a formal eye exam by an eye care  professional within the past year Reports that Susan Welch has not had a formal hearing evaluation within the past year  Cognitive Function: 6CIT Screen 08/03/2020  What Year? 0 points  What month? 0 points  What time? 0 points  Count back from 20 0 points  Months in reverse 0 points  Repeat phrase 0 points  Total Score 0    Normal Cognitive Function Screening: Yes (Normal:0-7, Significant for Dysfunction: >8)  Immunization & Health Maintenance Record Immunization History  Administered Date(s) Administered   Influenza,inj,Quad PF,6+ Mos 10/29/2012, 12/21/2013   Influenza-Unspecified 12/18/2011   PFIZER(Purple Top)SARS-COV-2 Vaccination 01/25/2019, 02/15/2019, 11/15/2019   Pneumococcal Conjugate-13 08/01/2014   Pneumococcal Polysaccharide-23 04/03/2012   Tdap 01/07/2014   Varicella 01/08/1999    Health Maintenance  Topic Date Due   Hepatitis C Screening  Never done   Zoster Vaccines- Shingrix (1 of 2) Never done   COLONOSCOPY (Pts 45-50yr Insurance coverage will need to be confirmed)  Never done   DEXA SCAN  Never done   MAMMOGRAM  09/14/2016   COVID-19 Vaccine (4 - Booster for [place]) 02/15/2020   INFLUENZA VACCINE  08/07/2020   TETANUS/TDAP  01/08/2024   PNA vac Low Risk Adult  Completed   HPV VACCINES  Aged Out       Assessment  This is a routine  wellness examination for Susan Welch.  Health Maintenance: Due or Overdue Health Maintenance Due  Topic Date Due   Hepatitis C Screening  Never done   Zoster Vaccines- Shingrix (1 of 2) Never done   COLONOSCOPY (Pts 45-14yr Insurance coverage will need to be confirmed)  Never done   DEXA SCAN  Never done   MAMMOGRAM  09/14/2016   COVID-19 Vaccine (4 - Booster for [place]) 02/15/2020    TElray Welch not need a referral for Community Assistance: Care Management:   not applicable Social Work:    not applicable Prescription Assistance:  not applicable Nutrition/Diabetes Education:  not applicable    Plan:  Personalized Goals  Goals Addressed               This Visit's Progress     Weight (lb) < 140 lb (63.5 kg) (pt-stated)   150 lb 6.4 oz (68.2 kg)     "I would like to loose 10 pounds. I would like to be at 140"       Personalized Health Maintenance & Screening Recommendations  Td vaccine Screening mammography Bone densitometry screening Colorectal cancer screening Shingles Vaccine  Lung Cancer Screening Recommended: not applicable (Low Dose CT Chest recommended if Age 75-80years, 30 pack-year currently smoking OR have quit w/in past 15 years) Hepatitis C Screening recommended: yes HIV Screening recommended: not applicable  Advanced Directives: Written information was not given per the patient's request.  Referrals & Orders Orders Placed This Encounter  Procedures   DG Bone Density   Cologuard     Follow-up Plan Follow-up with Corydon Schweiss, SCoralee Pesa NP as planned Schedule 1 year AMW Continue daily exercise plan and working on diet Zoster vaccine recommended Cologuard recommended Bone Density recommended Patient declines mammogram   I have personally reviewed and noted the following in the patient's chart:   Medical and social history Use of alcohol, tobacco or illicit drugs  Current medications and supplements Functional ability and status Nutritional status Physical activity Advanced directives List of other physicians Hospitalizations, surgeries, and ER visits in previous 12 months Vitals Screenings to include cognitive, depression, and falls Referrals and appointments  In addition, I have reviewed and discussed with patient certain preventive protocols, quality metrics, and best practice recommendations. A written personalized care plan for preventive services as well as general preventive health recommendations were provided to patient.     SOrma Render DNP, AGNP-c   08/03/2020

## 2020-08-03 NOTE — Patient Instructions (Signed)
[place] Maintenance Summary and Written Plan of Care  [name] ,  Thank you for allowing me to perform your Medicare Annual Wellness Visit and for your ongoing commitment to your health.   Health Maintenance & Immunization History Health Maintenance  Topic Date Due   Hepatitis C Screening  Never done   Zoster Vaccines- Shingrix (1 of 2) Never done   COLONOSCOPY (Pts 45-78yr Insurance coverage will need to be confirmed)  Never done   DEXA SCAN  Never done   MAMMOGRAM  09/14/2016   COVID-19 Vaccine (4 - Booster for Pfizer series) 02/15/2020   INFLUENZA VACCINE  08/07/2020   TETANUS/TDAP  01/08/2024   PNA vac Low Risk Adult  Completed   HPV VACCINES  Aged Out   Immunization History  Administered Date(s) Administered   Influenza,inj,Quad PF,6+ Mos 10/29/2012, 12/21/2013   Influenza-Unspecified 12/18/2011   PFIZER(Purple Top)SARS-COV-2 Vaccination 01/25/2019, 02/15/2019, 11/15/2019   Pneumococcal Conjugate-13 08/01/2014   Pneumococcal Polysaccharide-23 04/03/2012   Tdap 01/07/2014   Varicella 01/08/1999    These are the patient goals that we discussed:  Goals Addressed               This Visit's Progress     Weight (lb) < 140 lb (63.5 kg) (pt-stated)   150 lb 6.4 oz (68.2 kg)     "I would like to loose 10 pounds. I would like to be at 140"         This is a list of Health Maintenance Items that are overdue or due now: Health Maintenance Due  Topic Date Due   Hepatitis C Screening  Never done   Zoster Vaccines- Shingrix (1 of 2) Never done   COLONOSCOPY (Pts 45-411yrInsurance coverage will need to be confirmed)  Never done   DEXA SCAN  Never done   MAMMOGRAM  09/14/2016   COVID-19 Vaccine (4 - Booster for PfCoca-Colaeries) 02/15/2020     Orders/Referrals Placed Today: Orders Placed This Encounter  Procedures   DG Bone Density    Standing Status:   Future    Standing Expiration Date:   08/03/2021    Scheduling Instructions:      Please call patient to schedule    Order Specific Question:   Reason for Exam (SYMPTOM  OR DIAGNOSIS REQUIRED)    Answer:   screening bone density    Order Specific Question:   [facility]  Order Specific Question:   Release to patient    Answer:   Immediate   Cologuard    Order Specific Question:   Release to patient    Answer:   Immediate   (Contact our referral department at [phone] you have not spoken with someone about your referral appointment within the next 5 days)    Follow-up Plan Keep up the great work!!  I will follow along to look over your labs.    SaWorthy KeelerDNP, AGNP-c   Bone Density Test A bone density test uses a type of X-ray to measure the amount of calcium and other minerals in a person's bones. It can measure bone density in the hip and the spine. The test is similar to having a regular X-ray. This test may also be called: Bone densitometry. Bone mineral density test. Dual-energy X-ray absorptiometry (DEXA). You may have this test to: Diagnose a condition that causes weak or thin bones (osteoporosis). Screen you for osteoporosis. Predict  your risk for a broken bone (fracture). Determine how well your osteoporosis treatment is working. Tell a health care provider about: Any allergies you have. All medicines you are taking, including vitamins, herbs, eye drops, creams, and over-the-counter medicines. Any problems you or family members have had with anesthetic medicines. Any blood disorders you have. Any surgeries you have had. Any medical conditions you have. Whether you are pregnant or may be pregnant. Any medical tests you have had within the past 14 days that used contrast material. What are the risks? Generally, this is a safe test. However, it does expose you to a small amountof radiation, which can slightly increase your cancer risk. What happens before the test? Do not take any  calcium supplements within the 24 hours before your test. You will need to remove all metal jewelry, eyeglasses, removable dental appliances, and any other metal objects on your body. What happens during the test?  You will lie down on an exam table. There will be an X-ray generator below you and an imaging device above you. Other devices, such as boxes or braces, may be used to position your body properly for the scan. The machine will slowly scan your body. You will need to keep very still while the machine does the scan. The images will show up on a screen in the room. Images will be examined by a specialist after your test is finished. The procedure may vary among health care providers and hospitals. What can I expect after the test? It is up to you to get the results of your test. Ask your health care provider,or the department that is doing the test, when your results will be ready. Summary A bone density test is an imaging test that uses a type of X-ray to measure the amount of calcium and other minerals in your bones. The test may be used to diagnose or screen you for a condition that causes weak or thin bones (osteoporosis), predict your risk for a broken bone (fracture), or determine how well your osteoporosis treatment is working. Do not take any calcium supplements within 24 hours before your test. Ask your health care provider, or the department that is doing the test, when your results will be ready. This information is not intended to replace advice given to you by your health care provider. Make sure you discuss any questions you have with your healthcare provider. Document Revised: 06/10/2019 Document Reviewed: 06/10/2019 Elsevier Patient Education  [place] Maintenance, Female Adopting a healthy lifestyle and getting preventive care are important in promoting health and wellness. Ask your health care provider about: The right schedule for you to have regular  tests and exams. Things you can do on your own to prevent diseases and keep yourself healthy. What should I know about diet, weight, and exercise? Eat a healthy diet  Eat a diet that includes plenty of vegetables, fruits, low-fat dairy products, and lean protein. Do not eat a lot of foods that are high in solid fats, added sugars, or sodium.  Maintain a healthy weight Body mass index (BMI) is used to identify weight problems. It estimates body fat based on height and weight. Your health care provider can help determineyour BMI and help you achieve or maintain a healthy weight. Get regular exercise Get regular exercise. This is one of the most important things you can do for your health. Most adults should: Exercise for at least 150 minutes each week. The exercise should increase your  heart rate and make you sweat (moderate-intensity exercise). Do strengthening exercises at least twice a week. This is in addition to the moderate-intensity exercise. Spend less time sitting. Even light physical activity can be beneficial. Watch cholesterol and blood lipids Have your blood tested for lipids and cholesterol at 75 years of age, then havethis test every 5 years. Have your cholesterol levels checked more often if: Your lipid or cholesterol levels are high. You are older than 75 years of age. You are at high risk for heart disease. What should I know about cancer screening? Depending on your health history and family history, you may need to have cancer screening at various ages. This may include screening for: Breast cancer. Cervical cancer. Colorectal cancer. Skin cancer. Lung cancer. What should I know about heart disease, diabetes, and high blood pressure? Blood pressure and heart disease High blood pressure causes heart disease and increases the risk of stroke. This is more likely to develop in people who have high blood pressure readings, are of African descent, or are overweight. Have  your blood pressure checked: Every 3-5 years if you are 57-14 years of age. Every year if you are 6 years old or older. Diabetes Have regular diabetes screenings. This checks your fasting blood sugar level. Have the screening done: Once every three years after age 60 if you are at a normal weight and have a low risk for diabetes. More often and at a younger age if you are overweight or have a high risk for diabetes. What should I know about preventing infection? Hepatitis B If you have a higher risk for hepatitis B, you should be screened for this virus. Talk with your health care provider to find out if you are at risk forhepatitis B infection. Hepatitis C Testing is recommended for: Everyone born from [birth year] through 1965. Anyone with known risk factors for hepatitis C. Sexually transmitted infections (STIs) Get screened for STIs, including gonorrhea and chlamydia, if: You are sexually active and are younger than 75 years of age. You are older than 75 years of age and your health care provider tells you that you are at risk for this type of infection. Your sexual activity has changed since you were last screened, and you are at increased risk for chlamydia or gonorrhea. Ask your health care provider if you are at risk. Ask your health care provider about whether you are at high risk for HIV. Your health care provider may recommend a prescription medicine to help prevent HIV infection. If you choose to take medicine to prevent HIV, you should first get tested for HIV. You should then be tested every 3 months for as long as you are taking the medicine. Pregnancy If you are about to stop having your period (premenopausal) and you may become pregnant, seek counseling before you get pregnant. Take 400 to 800 micrograms (mcg) of folic acid every day if you become pregnant. Ask for birth control (contraception) if you want to prevent pregnancy. Osteoporosis and menopause Osteoporosis is a disease  in which the bones lose minerals and strength with aging. This can result in bone fractures. If you are 47 years old or older, or if you are at risk for osteoporosis and fractures, ask your health care provider if you should: Be screened for bone loss. Take a calcium or vitamin D supplement to lower your risk of fractures. Be given hormone replacement therapy (HRT) to treat symptoms of menopause. Follow these instructions at home: Lifestyle Do not use  any products that contain nicotine or tobacco, such as cigarettes, e-cigarettes, and chewing tobacco. If you need help quitting, ask your health care provider. Do not use street drugs. Do not share needles. Ask your health care provider for help if you need support or information about quitting drugs. Alcohol use Do not drink alcohol if: Your health care provider tells you not to drink. You are pregnant, may be pregnant, or are planning to become pregnant. If you drink alcohol: Limit how much you use to 0-1 drink a day. Limit intake if you are breastfeeding. Be aware of how much alcohol is in your drink. In the [place] one drink equals one 12 oz bottle of beer (355 mL), one 5 oz glass of wine (148 mL), or one 1 oz glass of hard liquor (44 mL). General instructions Schedule regular health, dental, and eye exams. Stay current with your vaccines. Tell your health care provider if: You often feel depressed. You have ever been abused or do not feel safe at home. Summary Adopting a healthy lifestyle and getting preventive care are important in promoting health and wellness. Follow your health care provider's instructions about healthy diet, exercising, and getting tested or screened for diseases. Follow your health care provider's instructions on monitoring your cholesterol and blood pressure. This information is not intended to replace advice given to you by your health care provider. Make sure you discuss any questions you have with your  healthcare provider. Document Revised: 12/17/2017 Document Reviewed: 12/17/2017 Elsevier Patient Education  2022 [employer].

## 2020-08-24 ENCOUNTER — Other Ambulatory Visit: Payer: Self-pay | Admitting: Cardiology

## 2020-09-18 NOTE — Assessment & Plan Note (Signed)
Patient with ASCVD and LDL not currently at goal.  She was recently released from the CLEAR trial and needs further lipid management.  Reviewed options for lowering LDL cholesterol, including  PCSK-9 inhibitors, bempedoic acid and inclisiran.  Discussed mechanisms of action, dosing, side effects and potential decreases in LDL cholesterol.  Also reviewed cost information and potential options for patient assistance.  Answered all patient questions.  Based on this information, patient would prefer to start PCSK-9 inhibitor.  Will have Susan Welch start paperwork to get PA approved.   Once completed, she will need to take Repatha 140 mg q14 d and repeat lipid labs after 4-5 doses.

## 2020-09-20 ENCOUNTER — Other Ambulatory Visit: Payer: Self-pay

## 2020-09-20 DIAGNOSIS — E785 Hyperlipidemia, unspecified: Secondary | ICD-10-CM | POA: Diagnosis not present

## 2020-09-20 LAB — LIPID PANEL
Chol/HDL Ratio: 2.4 ratio (ref 0.0–4.4)
Cholesterol, Total: 154 mg/dL (ref 100–199)
HDL: 64 mg/dL (ref >39)
LDL Chol Calc (NIH): 70 mg/dL (ref 0–99)
Triglycerides: 114 mg/dL (ref 0–149)
VLDL Cholesterol Cal: 20 mg/dL (ref 5–40)

## 2020-09-25 ENCOUNTER — Encounter (INDEPENDENT_AMBULATORY_CARE_PROVIDER_SITE_OTHER): Payer: Medicare Other | Admitting: Ophthalmology

## 2020-09-27 ENCOUNTER — Telehealth: Payer: Self-pay

## 2020-09-27 NOTE — Telephone Encounter (Signed)
Called and lmomed the pt that we need to discuss lab results.

## 2020-10-02 ENCOUNTER — Other Ambulatory Visit: Payer: Self-pay

## 2020-10-02 DIAGNOSIS — E785 Hyperlipidemia, unspecified: Secondary | ICD-10-CM

## 2020-10-08 NOTE — Progress Notes (Signed)
Cardiology Office Note   Date:  10/10/2020   ID:  Susan Welch, DOB November 14, 1945, MRN [id number]  PCP:  Orma Render, NP  Cardiologist:   Minus Breeding, MD   Chief Complaint  Patient presents with   Fatigue       History of Present Illness: Susan Welch is a 75 y.o. female who presents for followup after bypass. She was admitted for total knee replacement and had a V. Fib arrest and was found to have three-vessel disease.  She had a mildly reduced ejection fraction.  She says since I last saw her she has had increasing fatigue.  She recently says that this happens profoundly after she gets her Repatha injections.  She is fatigued and then she slowly starts to recover until the next 1.  She says she wants to sleep a lot at the time during the day.  She is not walking the dog as far she was.  She denies any chest pressure, neck or arm discomfort.  She is not having any palpitations, presyncope or syncope.  She is not having any weight gain or edema.   Past Medical History:  Diagnosis Date   Anemia    Anxiety    Hx: of situational anxiety   Arthritis    a. bilat knees s/p R TKA 07/15/2012   Carotid stenosis    Pre-CABG Dopplers 07/19/12: Right 40-59%.   Coronary artery disease    a. s/p VF arrest during TKR 7/14 2/2 NSTEMI => LHC with 3v CAD =>  s/p CABG 07/21/12 (LIMA-LAD, SVG-RI and OM1, SVG-distal RCA).    Coronary artery disease involving native coronary artery of native heart without angina pectoris 06/17/2016   DJD (degenerative joint disease) of knee 09/21/2014   DVT (deep venous thrombosis) ([place]) 07/2012   GERD (gastroesophageal reflux disease)    Headache(784.0)    Hx: of migraines   HTN (hypertension) 09/20/2011   Hyperlipidemia    Hypertension    Ischemic cardiomyopathy    Echo 07/19/12: EF 40-45%, basal to mid anteroseptal AK, distal anteroseptal HK, mild MR.    Knee joint replacement by other means 07/26/2012   Osteoarthritis of right knee 07/16/2012   Panic attacks     Pneumonia    Serum potassium elevated 12/14/2015   Ventricular fibrillation ([place])    a. 07/15/2012 in setting of R TKA   Wears glasses     Past Surgical History:  Procedure Laterality Date   ABDOMINAL HYSTERECTOMY     CORONARY ARTERY BYPASS GRAFT N/A 07/21/2012   Procedure: CORONARY ARTERY BYPASS GRAFTING (CABG);  Surgeon: Grace Isaac, MD;  Location: [place];  Service: Open Heart Surgery;  Laterality: N/A;  Times 4 using left internal mammary artery and endoscopically harvested left saphenous vein.   DILATION AND CURETTAGE OF UTERUS     INTRAOPERATIVE TRANSESOPHAGEAL ECHOCARDIOGRAM N/A 07/21/2012   Procedure: INTRAOPERATIVE TRANSESOPHAGEAL ECHOCARDIOGRAM;  Surgeon: Grace Isaac, MD;  Location: [place];  Service: Open Heart Surgery;  Laterality: N/A;   LEFT HEART CATHETERIZATION WITH CORONARY ANGIOGRAM N/A 07/17/2012   Procedure: LEFT HEART CATHETERIZATION WITH CORONARY ANGIOGRAM;  Surgeon: Minus Breeding, MD;  Location: [facility] CATH LAB;  Service: Cardiovascular;  Laterality: N/A;   TOTAL KNEE ARTHROPLASTY Right 07/15/2012   Procedure: TOTAL KNEE ARTHROPLASTY;  Surgeon: [name], MD;  Location: [place];  Service: Orthopedics;  Laterality: Right;  drapes pulled back to provide cpr, new drape applied, protocol followed   TOTAL KNEE ARTHROPLASTY Left 09/21/2014  Procedure: TOTAL LEFT KNEE ARTHROPLASTY;  Surgeon: [name], MD;  Location: [place];  Service: Orthopedics;  Laterality: Left;     Current Outpatient Medications  Medication Sig Dispense Refill   aspirin 81 MG tablet Take 81 mg by mouth daily.     calcium citrate-vitamin D (CITRACAL+D) 315-200 MG-UNIT per tablet Take 1 tablet by mouth daily.     chlorthalidone (HYGROTON) 25 MG tablet Take 1 tablet (25 mg total) by mouth daily. 90 tablet 0   Cholecalciferol (VITAMIN D) 2000 UNITS tablet Take 1 tablet (2,000 Units total) by mouth daily. 30 tablet 0   Coenzyme Q10 (CO Q10) 200 MG CAPS Take 200 mg by mouth daily. 30 capsule 0    Evolocumab (REPATHA SURECLICK) 154 MG/ML SOAJ Inject 140 mg into the skin every 14 (fourteen) days. 2 mL 11   ezetimibe (ZETIA) 10 MG tablet Take 1 tablet (10 mg total) by mouth daily. 90 tablet 3   folic acid (FOLVITE) 008 MCG tablet Take by mouth daily.      lisinopril (ZESTRIL) 10 MG tablet Take 1 tablet (10 mg total) by mouth 2 (two) times daily. 180 tablet 3   loratadine (CLARITIN) 10 MG tablet Take 10 mg by mouth daily.     meloxicam (MOBIC) 15 MG tablet Take 1 tablet (15 mg total) by mouth daily. 30 tablet 3   mometasone (ELOCON) 0.1 % cream Apply small amount to skin lesions once a day as needed. 45 g 5   omeprazole (PRILOSEC) 10 MG capsule Take 10 mg by mouth daily.     metoprolol tartrate (LOPRESSOR) 25 MG tablet Take 1 tablet (25 mg total) by mouth 2 (two) times daily. 180 tablet 3   No current facility-administered medications for this visit.    Allergies:   Oxycodone, Crestor [rosuvastatin], Latex, Pravastatin, Statins, and Nickel    ROS:  Please see the history of present illness.   Otherwise, review of systems are positive for none.   All other systems are reviewed and negative.    PHYSICAL EXAM: VS:  BP 119/69   Pulse 71   Ht 5\' 2"  (1.575 m)   Wt 150 lb (68 kg)   SpO2 96%   BMI 27.44 kg/m  , BMI Body mass index is 27.44 kg/m. GENERAL:  Well appearing NECK:  No jugular venous distention, waveform within normal limits, carotid upstroke brisk and symmetric, no bruits, no thyromegaly LUNGS:  Clear to auscultation bilaterally CHEST:  Well healed sternotomy scar. HEART:  PMI not displaced or sustained,S1 and S2 within normal limits, no S3, no S4, no clicks, no rubs, no murmurs ABD:  Flat, positive bowel sounds normal in frequency in pitch, no bruits, no rebound, no guarding, no midline pulsatile mass, no hepatomegaly, no splenomegaly EXT:  2 plus pulses throughout, no edema, no cyanosis no clubbing   EKG:  EKG is  ordered today. The ekg ordered today demonstrates  NSR, rate 71, old inferior MI.  No acute ST T wave changes.   Recent Labs: 06/20/2020: BUN 30; Creatinine, Ser 1.07; Potassium 4.0; Sodium 138    Lipid Panel    Component Value Date/Time   CHOL 154 09/20/2020 0941   TRIG 114 09/20/2020 0941   HDL 64 09/20/2020 0941   CHOLHDL 2.4 09/20/2020 0941   CHOLHDL 4 05/18/2013 0844   VLDL 36.8 05/18/2013 0844   LDLCALC 70 09/20/2020 0941   LDLDIRECT 174.9 02/08/2013 0914      Wt Readings from Last 3 Encounters:  10/10/20  150 lb (68 kg)  08/03/20 150 lb 6.4 oz (68.2 kg)  07/27/20 151 lb 12.8 oz (68.9 kg)      Other studies Reviewed: Additional studies/ records that were reviewed today include: Labs. Review of the above records demonstrates:  Please see elsewhere in the note.     ASSESSMENT AND PLAN:  CAD:   The patient has no new sypmtoms.  No further cardiovascular testing is indicated.  We will continue with aggressive risk reduction and meds as listed.I do not think her fatigue represents an anginal equivalent.   HTN:  Her BP is controlled.  However, because of her significant fatigue I will reduce her metoprolol to 25 mg twice daily.    FATIGUE: She is really thinking this is related to Repatha.  I am going to reduce the beta-blocker as above.  I will check a CBC, TSH and B12.  I will talk with Dr. Debara Pickett to see if he has seen this at all with Repatha.  For now she will continue   DYSLIPIDEMIA:   I will discuss alternatives and whether her fatigue could be related to the [place].  I will discuss this with Dr. Debara Pickett.  I am not familiar with this as a side effect.   Current medicines are reviewed at length with the patient today.  The patient does not have concerns regarding medicines.  The following changes have been made: As above  Labs/ tests ordered today include:   Orders Placed This Encounter  Procedures   CBC   TSH   B12   EKG 12-Lead      Disposition:   FU with me in 12 months or sooner if  needed   Signed, Minus Breeding, MD  10/10/2020 7:34 PM    [place]

## 2020-10-10 ENCOUNTER — Other Ambulatory Visit: Payer: Self-pay

## 2020-10-10 ENCOUNTER — Encounter: Payer: Self-pay | Admitting: Cardiology

## 2020-10-10 ENCOUNTER — Ambulatory Visit: Payer: Medicare Other | Admitting: Cardiology

## 2020-10-10 VITALS — BP 119/69 | HR 71 | Ht 62.0 in | Wt 150.0 lb

## 2020-10-10 DIAGNOSIS — R5383 Other fatigue: Secondary | ICD-10-CM

## 2020-10-10 DIAGNOSIS — E785 Hyperlipidemia, unspecified: Secondary | ICD-10-CM | POA: Diagnosis not present

## 2020-10-10 DIAGNOSIS — I2510 Atherosclerotic heart disease of native coronary artery without angina pectoris: Secondary | ICD-10-CM | POA: Diagnosis not present

## 2020-10-10 DIAGNOSIS — I10 Essential (primary) hypertension: Secondary | ICD-10-CM | POA: Diagnosis not present

## 2020-10-10 MED ORDER — METOPROLOL TARTRATE 25 MG PO TABS: 25.0000 mg | ORAL_TABLET | Freq: Two times a day (BID) | ORAL | 3 refills | Status: DC

## 2020-10-10 NOTE — Patient Instructions (Signed)
Medication Instructions:  Decrease Metoprolol to 25 mg twice a day.  *If you need a refill on your cardiac medications before your next appointment, please call your pharmacy*   Lab Work: Your physician recommends that you return for lab work (CBC, B12, TSH)  If you have labs (blood work) drawn today and your tests are completely normal, you will receive your results only by: MyChart Message (if you have [place]) OR A paper copy in the mail If you have any lab test that is abnormal or we need to change your treatment, we will call you to review the results.   Testing/Procedures: None ordered today   Follow-Up: At [facility], you and your health needs are our priority.  As part of our continuing mission to provide you with exceptional heart care, we have created designated Provider Care Teams.  These Care Teams include your primary Cardiologist (physician) and Advanced Practice Providers (APPs -  Physician Assistants and Nurse Practitioners) who all work together to provide you with the care you need, when you need it.  We recommend signing up for the patient portal called "MyChart".  Sign up information is provided on this After Visit Summary.  MyChart is used to connect with patients for Virtual Visits (Telemedicine).  Patients are able to view lab/test results, encounter notes, upcoming appointments, etc.  Non-urgent messages can be sent to your provider as well.   To learn more about what you can do with MyChart, go to [email or web address].    Your next appointment:   1 year(s)  The format for your next appointment:   In Person  Provider:   Minus Breeding, MD

## 2020-10-11 ENCOUNTER — Telehealth: Payer: Self-pay

## 2020-10-11 NOTE — Telephone Encounter (Signed)
Pa for praluent 75 q14d submitted [name] Key: [id number]

## 2020-10-11 NOTE — Telephone Encounter (Signed)
-----   Message from Pixie Casino, MD sent at 10/11/2020  2:43 PM EDT ----- Regarding: RE: lipid question She could try it - but since the mechanism is the same as Repatha (monoclonal antibody) - she may also have the same symptoms with Praluent.  Dr. Lemmie Evens ----- Message ----- From: Allean Found, [place] Sent: 10/11/2020   2:23 PM EDT To: Minus Breeding, MD, Pixie Casino, MD, # Subject: lipid question                                 Unfortunately the patient doesn't have the insurance that can affordably get her leqvio. We could try the praluent maybe the 75mg ?  ----- Message ----- From: Pixie Casino, MD Sent: 10/11/2020   9:43 AM EDT To: Minus Breeding, MD, Allean Found, CMA, #  Yes . Marland Kitchen Since the fatigue is an "antibody" effect, would advise you try to pursue Leqvio (inclisiran)- she is on medicare, but not sure if she has the appropriate coverage to make it affordable. Looks like the pharmacists had her on [facility] - will reach out to [facility] / PharmD's to coordinate this.  -[place]  ----- Message ----- From: Minus Breeding, MD Sent: 10/11/2020   9:15 AM EDT To: Pixie Casino, MD  That is exactly what is happening.  Can we switch to Praluent or incleserin  ----- Message ----- From: Pixie Casino, MD Sent: 10/11/2020   7:49 AM EDT To: Minus Breeding, MD  Some patients have fatigue /flu-like symptoms, but usually within 24-48 hrs after injection and then they resolve - it would have to happen every time there is an injection to say there is a correlation.  ----- Message ----- From: Minus Breeding, MD Sent: 10/10/2020   5:58 PM EDT To: Pixie Casino, MD  Hey,  Does repatha ever cause fatigue in your patients?  Thanks.

## 2020-10-11 NOTE — Telephone Encounter (Signed)
Called and offered pt a sample of praluent 75 and the pt stated that she would be happy to try it.

## 2020-10-12 ENCOUNTER — Telehealth: Payer: Self-pay | Admitting: Cardiology

## 2020-10-12 DIAGNOSIS — R5383 Other fatigue: Secondary | ICD-10-CM | POA: Diagnosis not present

## 2020-10-12 NOTE — Telephone Encounter (Signed)
Pt c/o medication issue:  1. Name of Medication: praluent   2. How are you currently taking this medication (dosage and times per day)?   3. Are you having a reaction (difficulty breathing--STAT)? no  4. What is your medication issue? Darryl from [employer] calling to inform the medication was approved for 1 year from yesterday. Phone [phone] option 5

## 2020-10-12 NOTE — Telephone Encounter (Signed)
Called and spoke to pt and stated that they were approbed for the praluent 75 but to let us know how they tolerate the sample first before we send rx and they voiced understanding

## 2020-10-13 LAB — CBC
Hematocrit: 40.1 % (ref 34.0–46.6)
Hemoglobin: 13.3 g/dL (ref 11.1–15.9)
MCH: 28.8 pg (ref 26.6–33.0)
MCHC: 33.2 g/dL (ref 31.5–35.7)
MCV: 87 fL (ref 79–97)
Platelets: 359 10*3/uL (ref 150–450)
RBC: 4.62 x10E6/uL (ref 3.77–5.28)
RDW: 12.7 % (ref 11.7–15.4)
WBC: 7.8 10*3/uL (ref 3.4–10.8)

## 2020-10-13 LAB — TSH: TSH: 2.34 u[IU]/mL (ref 0.450–4.500)

## 2020-10-13 LAB — VITAMIN B12: Vitamin B-12: 541 pg/mL (ref 232–1245)

## 2020-10-19 ENCOUNTER — Encounter (HOSPITAL_BASED_OUTPATIENT_CLINIC_OR_DEPARTMENT_OTHER): Payer: Self-pay

## 2020-10-19 ENCOUNTER — Telehealth: Payer: Self-pay | Admitting: Cardiology

## 2020-10-19 MED ORDER — PRALUENT 75 MG/ML ~~LOC~~ SOAJ: 75.0000 mg | SUBCUTANEOUS | 12 refills | Status: DC

## 2020-10-19 NOTE — Telephone Encounter (Signed)
*  STAT* If patient is at the pharmacy, call can be transferred to refill team.   1. Which medications need to be refilled? (please list name of each medication and dose if known) meloxicam (MOBIC) 15 MG tablet   2. Which pharmacy/location (including street and city if local pharmacy) is medication to be sent to? [facility] [place], [place] AT [address]  3. Do they need a 30 day or 90 day supply? 90 day supply

## 2020-10-19 NOTE — Telephone Encounter (Signed)
Pt c/o medication issue:  1. Name of Medication: Praluent  2. How are you currently taking this medication (dosage and times per day)?   3. Are you having a reaction (difficulty breathing--STAT)? no  4. What is your medication issue? Pt was given a sample to try and she would like for a script to be sent to the pharmacy. She tolerated the medicine well

## 2020-10-19 NOTE — Telephone Encounter (Signed)
Patient reports feeling much better since starting Praluent 75 mg.  Rx sent to [facility]

## 2020-12-30 ENCOUNTER — Other Ambulatory Visit: Payer: Self-pay | Admitting: Cardiology

## 2021-02-01 ENCOUNTER — Ambulatory Visit
Admission: RE | Admit: 2021-02-01 | Discharge: 2021-02-01 | Disposition: A | Discharge status: Home / Self Care | Payer: Medicare Other | Source: Ambulatory Visit | Attending: Nurse Practitioner | Admitting: Nurse Practitioner

## 2021-02-01 DIAGNOSIS — Z780 Asymptomatic menopausal state: Secondary | ICD-10-CM

## 2021-02-01 DIAGNOSIS — M8589 Other specified disorders of bone density and structure, multiple sites: Secondary | ICD-10-CM | POA: Diagnosis not present

## 2021-02-05 ENCOUNTER — Other Ambulatory Visit (HOSPITAL_BASED_OUTPATIENT_CLINIC_OR_DEPARTMENT_OTHER): Payer: Self-pay | Admitting: Nurse Practitioner

## 2021-02-05 DIAGNOSIS — M85852 Other specified disorders of bone density and structure, left thigh: Secondary | ICD-10-CM

## 2021-02-05 DIAGNOSIS — M85851 Other specified disorders of bone density and structure, right thigh: Secondary | ICD-10-CM

## 2021-03-13 ENCOUNTER — Other Ambulatory Visit: Payer: Self-pay | Admitting: Cardiology

## 2021-03-29 ENCOUNTER — Other Ambulatory Visit: Payer: Self-pay | Admitting: Cardiology

## 2021-05-21 ENCOUNTER — Other Ambulatory Visit: Payer: Self-pay | Admitting: Cardiology

## 2021-05-21 DIAGNOSIS — E789 Disorder of lipoprotein metabolism, unspecified: Secondary | ICD-10-CM

## 2021-06-12 ENCOUNTER — Other Ambulatory Visit (HOSPITAL_BASED_OUTPATIENT_CLINIC_OR_DEPARTMENT_OTHER): Payer: Self-pay | Admitting: Nurse Practitioner

## 2021-06-12 DIAGNOSIS — E789 Disorder of lipoprotein metabolism, unspecified: Secondary | ICD-10-CM

## 2021-10-15 ENCOUNTER — Ambulatory Visit: Payer: Medicare Other | Attending: Cardiology | Admitting: Cardiology

## 2021-10-15 ENCOUNTER — Encounter: Payer: Self-pay | Admitting: Cardiology

## 2021-10-15 VITALS — BP 145/79 | HR 60 | Ht 62.0 in | Wt 156.2 lb

## 2021-10-15 DIAGNOSIS — I2510 Atherosclerotic heart disease of native coronary artery without angina pectoris: Secondary | ICD-10-CM

## 2021-10-15 DIAGNOSIS — E785 Hyperlipidemia, unspecified: Secondary | ICD-10-CM

## 2021-10-15 DIAGNOSIS — I10 Essential (primary) hypertension: Secondary | ICD-10-CM

## 2021-10-15 MED ORDER — REPATHA 140 MG/ML ~~LOC~~ SOSY: 140.0000 mg | PREFILLED_SYRINGE | SUBCUTANEOUS | 11 refills | Status: DC

## 2021-10-15 NOTE — Patient Instructions (Signed)
Medication Instructions:   -Stop praluent.  -Start taking Evolocumab '140mg'$ /ml once every 14 days.  *If you need a refill on your cardiac medications before your next appointment, please call your pharmacy*   Lab Work: Your physician recommends that you return for lab work in: 3 months for FASTING lipid panel  If you have labs (blood work) drawn today and your tests are completely normal, you will receive your results only by: [place] (if you have MyChart) OR A paper copy in the mail If you have any lab test that is abnormal or we need to change your treatment, we will call you to review the results.   Follow-Up: At [facility], you and your health needs are our priority.  As part of our continuing mission to provide you with exceptional heart care, we have created designated Provider Care Teams.  These Care Teams include your primary Cardiologist (physician) and Advanced Practice Providers (APPs -  Physician Assistants and Nurse Practitioners) who all work together to provide you with the care you need, when you need it.  We recommend signing up for the patient portal called "MyChart".  Sign up information is provided on this After Visit Summary.  MyChart is used to connect with patients for Virtual Visits (Telemedicine).  Patients are able to view lab/test results, encounter notes, upcoming appointments, etc.  Non-urgent messages can be sent to your provider as well.   To learn more about what you can do with MyChart, go to [email or web address].    Your next appointment:   12 month(s)  The format for your next appointment:   In Person  Provider:   Minus Breeding, MD

## 2021-10-15 NOTE — Progress Notes (Signed)
Cardiology Office Note   Date:  10/15/2021   ID:  Susan Welch, DOB 1946/01/03, MRN [id number]  PCP:  Orma Render, NP  Cardiologist:   Minus Breeding, MD   Chief Complaint  Patient presents with   Coronary Artery Disease       History of Present Illness: Susan Welch is a 76 y.o. female who presents for followup after bypass. She was admitted for total knee replacement and had a V. Fib arrest and was found to have three-vessel disease.  She had a mildly reduced ejection fraction.  Since I last saw her she has had no new cardiovascular complaints.  She is limited because of foot pain and she might have to have some procedure done.  She does try to do some activities such as walking her dog and does not like to walk. The patient denies any new symptoms such as chest discomfort, neck or arm discomfort. There has been no new shortness of breath, PND or orthopnea. There have been no reported palpitations, presyncope or syncope.  Of note she is having trouble affording Praluent.  She was switched from [place] because of profound but she wants to switch back to [place].   Past Medical History:  Diagnosis Date   Anemia    Anxiety    Hx: of situational anxiety   Arthritis    a. bilat knees s/p R TKA 07/15/2012   Carotid stenosis    Pre-CABG Dopplers 07/19/12: Right 40-59%.   Coronary artery disease    a. s/p VF arrest during TKR 7/14 2/2 NSTEMI => LHC with 3v CAD =>  s/p CABG 07/21/12 (LIMA-LAD, SVG-RI and OM1, SVG-distal RCA).    Coronary artery disease involving native coronary artery of native heart without angina pectoris 06/17/2016   DJD (degenerative joint disease) of knee 09/21/2014   DVT (deep venous thrombosis) ([place]) 07/2012   GERD (gastroesophageal reflux disease)    Headache(784.0)    Hx: of migraines   HTN (hypertension) 09/20/2011   Hyperlipidemia    Hypertension    Ischemic cardiomyopathy    Echo 07/19/12: EF 40-45%, basal to mid anteroseptal AK, distal anteroseptal  HK, mild MR.    Knee joint replacement by other means 07/26/2012   Osteoarthritis of right knee 07/16/2012   Panic attacks    Pneumonia    Serum potassium elevated 12/14/2015   Ventricular fibrillation ([place])    a. 07/15/2012 in setting of R TKA   Wears glasses     Past Surgical History:  Procedure Laterality Date   ABDOMINAL HYSTERECTOMY     CORONARY ARTERY BYPASS GRAFT N/A 07/21/2012   Procedure: CORONARY ARTERY BYPASS GRAFTING (CABG);  Surgeon: Grace Isaac, MD;  Location: [place];  Service: Open Heart Surgery;  Laterality: N/A;  Times 4 using left internal mammary artery and endoscopically harvested left saphenous vein.   DILATION AND CURETTAGE OF UTERUS     INTRAOPERATIVE TRANSESOPHAGEAL ECHOCARDIOGRAM N/A 07/21/2012   Procedure: INTRAOPERATIVE TRANSESOPHAGEAL ECHOCARDIOGRAM;  Surgeon: Grace Isaac, MD;  Location: [place];  Service: Open Heart Surgery;  Laterality: N/A;   LEFT HEART CATHETERIZATION WITH CORONARY ANGIOGRAM N/A 07/17/2012   Procedure: LEFT HEART CATHETERIZATION WITH CORONARY ANGIOGRAM;  Surgeon: Minus Breeding, MD;  Location: [facility] CATH LAB;  Service: Cardiovascular;  Laterality: N/A;   TOTAL KNEE ARTHROPLASTY Right 07/15/2012   Procedure: TOTAL KNEE ARTHROPLASTY;  Surgeon: [name], MD;  Location: [place];  Service: Orthopedics;  Laterality: Right;  drapes pulled back to provide cpr, new  drape applied, protocol followed   TOTAL KNEE ARTHROPLASTY Left 09/21/2014   Procedure: TOTAL LEFT KNEE ARTHROPLASTY;  Surgeon: [name], MD;  Location: [place];  Service: Orthopedics;  Laterality: Left;     Current Outpatient Medications  Medication Sig Dispense Refill   aspirin 81 MG tablet Take 81 mg by mouth daily.     calcium citrate-vitamin D (CITRACAL+D) 315-200 MG-UNIT per tablet Take 1 tablet by mouth daily.     chlorthalidone (HYGROTON) 25 MG tablet TAKE 1 TABLET(25 MG) BY MOUTH DAILY 90 tablet 3   Cholecalciferol (VITAMIN D) 2000 UNITS tablet Take 1 tablet (2,000  Units total) by mouth daily. 30 tablet 0   Coenzyme Q10 (CO Q10) 200 MG CAPS Take 200 mg by mouth daily. 30 capsule 0   Evolocumab (REPATHA) 140 MG/ML SOSY Inject 140 mg into the skin every 14 (fourteen) days. 2 mL 11   ezetimibe (ZETIA) 10 MG tablet TAKE 1 TABLET(10 MG) BY MOUTH DAILY 90 tablet 3   folic acid (FOLVITE) 833 MCG tablet Take by mouth daily.      lisinopril (ZESTRIL) 10 MG tablet TAKE 1 TABLET BY MOUTH TWICE DAILY 180 tablet 3   loratadine (CLARITIN) 10 MG tablet Take 10 mg by mouth daily.     meloxicam (MOBIC) 15 MG tablet Take 1 tablet (15 mg total) by mouth daily. 30 tablet 3   metoprolol tartrate (LOPRESSOR) 25 MG tablet Take 1 tablet (25 mg total) by mouth 2 (two) times daily. 180 tablet 3   mometasone (ELOCON) 0.1 % cream Apply small amount to skin lesions once a day as needed. 45 g 5   omeprazole (PRILOSEC) 10 MG capsule Take 10 mg by mouth daily.     No current facility-administered medications for this visit.    Allergies:   Oxycodone, Crestor [rosuvastatin], Latex, Pravastatin, Statins, and Nickel    ROS:  Please see the history of present illness.   Otherwise, review of systems are positive for none.   All other systems are reviewed and negative.    PHYSICAL EXAM: VS:  BP (!) 145/79   Pulse 60   Ht '5\' 2"'$  (1.575 m)   Wt 156 lb 3.2 oz (70.9 kg)   SpO2 96%   BMI 28.57 kg/m  , BMI Body mass index is 28.57 kg/m. GENERAL:  Well appearing NECK:  No jugular venous distention, waveform within normal limits, carotid upstroke brisk and symmetric, no bruits, no thyromegaly LUNGS:  Clear to auscultation bilaterally CHEST:  Well healed sternotomy scar. HEART:  PMI not displaced or sustained,S1 and S2 within normal limits, no S3, no S4, no clicks, no rubs, no murmurs ABD:  Flat, positive bowel sounds normal in frequency in pitch, no bruits, no rebound, no guarding, no midline pulsatile mass, no hepatomegaly, no splenomegaly EXT:  2 plus pulses throughout, no edema, no  cyanosis no clubbing   EKG:  EKG is  ordered today. The ekg ordered today demonstrates NSR, rate 60, old inferior MI.  No acute ST T wave changes.   Recent Labs: No results found for requested labs within last 365 days.    Lipid Panel    Component Value Date/Time   CHOL 154 09/20/2020 0941   TRIG 114 09/20/2020 0941   HDL 64 09/20/2020 0941   CHOLHDL 2.4 09/20/2020 0941   CHOLHDL 4 05/18/2013 0844   VLDL 36.8 05/18/2013 0844   LDLCALC 70 09/20/2020 0941   LDLDIRECT 174.9 02/08/2013 0914      Wt Readings  from Last 3 Encounters:  10/15/21 156 lb 3.2 oz (70.9 kg)  10/10/20 150 lb (68 kg)  08/03/20 150 lb 6.4 oz (68.2 kg)      Other studies Reviewed: Additional studies/ records that were reviewed today include: Labs. Review of the above records demonstrates:  Please see elsewhere in the note.     ASSESSMENT AND PLAN:  CAD:   The patient has no new sypmtoms.  No further cardiovascular testing is indicated.  We will continue with aggressive risk reduction and meds as listed.  HTN:   His blood pressure is a little bit elevated today but she was rushing to get here.  He is typically very well controlled.    DYSLIPIDEMIA: Given her difficulty affording Praluent I will switch her back to [place].  She will get a lipid profile in 3 months.   Current medicines are reviewed at length with the patient today.  The patient does not have concerns regarding medicines.  The following changes have been made: As above  Labs/ tests ordered today include:   Orders Placed This Encounter  Procedures   Lipid panel   EKG 12-Lead      Disposition:   FU with me in 12 months or sooner if needed   Signed, Minus Breeding, MD  10/15/2021 1:24 PM    [place]

## 2021-10-16 ENCOUNTER — Telehealth: Payer: Self-pay | Admitting: Cardiology

## 2021-10-16 NOTE — Telephone Encounter (Signed)
*  STAT* If patient is at the pharmacy, call can be transferred to refill team.   1. Which medications need to be refilled? (please list name of each medication and dose if known)   Evolocumab (REPATHA) 140 MG/ML SOSY  2. Which pharmacy/location (including street and city if local pharmacy) is medication to be sent to?  [facility] [place], [place] AT [address]  3. Do they need a 30 day or 90 day supply?   90 day  Caller stated they will need a prior authorization for this medication. Ref# [id number],  Store [phone].

## 2021-10-16 NOTE — Telephone Encounter (Signed)
PA for Repatha submitted.  Key: [id number]

## 2021-10-17 NOTE — Telephone Encounter (Signed)
Received call from [facility] [place] for [place] has been approved from 10/16/2021 to 10/17/2022.

## 2021-12-27 ENCOUNTER — Other Ambulatory Visit: Payer: Self-pay | Admitting: Cardiology

## 2022-02-19 ENCOUNTER — Encounter: Payer: Self-pay | Admitting: *Deleted

## 2022-03-01 DIAGNOSIS — E785 Hyperlipidemia, unspecified: Secondary | ICD-10-CM | POA: Diagnosis not present

## 2022-03-01 DIAGNOSIS — I10 Essential (primary) hypertension: Secondary | ICD-10-CM | POA: Diagnosis not present

## 2022-03-01 DIAGNOSIS — I2510 Atherosclerotic heart disease of native coronary artery without angina pectoris: Secondary | ICD-10-CM | POA: Diagnosis not present

## 2022-03-01 LAB — LIPID PANEL
Chol/HDL Ratio: 2.8 ratio (ref 0.0–4.4)
Cholesterol, Total: 179 mg/dL (ref 100–199)
HDL: 65 mg/dL (ref >39)
LDL Chol Calc (NIH): 86 mg/dL (ref 0–99)
Triglycerides: 165 mg/dL — ABNORMAL HIGH (ref 0–149)
VLDL Cholesterol Cal: 28 mg/dL (ref 5–40)

## 2022-03-08 ENCOUNTER — Other Ambulatory Visit: Payer: Self-pay | Admitting: *Deleted

## 2022-03-08 DIAGNOSIS — E785 Hyperlipidemia, unspecified: Secondary | ICD-10-CM

## 2022-03-18 ENCOUNTER — Other Ambulatory Visit: Payer: Self-pay | Admitting: *Deleted

## 2022-03-18 DIAGNOSIS — E785 Hyperlipidemia, unspecified: Secondary | ICD-10-CM

## 2022-03-23 ENCOUNTER — Other Ambulatory Visit (HOSPITAL_BASED_OUTPATIENT_CLINIC_OR_DEPARTMENT_OTHER): Payer: Self-pay | Admitting: Nurse Practitioner

## 2022-03-23 DIAGNOSIS — E789 Disorder of lipoprotein metabolism, unspecified: Secondary | ICD-10-CM

## 2022-03-29 ENCOUNTER — Ambulatory Visit: Payer: Medicare Other | Admitting: Podiatry

## 2022-03-29 ENCOUNTER — Ambulatory Visit (INDEPENDENT_AMBULATORY_CARE_PROVIDER_SITE_OTHER): Payer: Medicare Other

## 2022-03-29 DIAGNOSIS — M7751 Other enthesopathy of right foot: Secondary | ICD-10-CM

## 2022-03-29 DIAGNOSIS — R52 Pain, unspecified: Secondary | ICD-10-CM

## 2022-03-29 DIAGNOSIS — M2022 Hallux rigidus, left foot: Secondary | ICD-10-CM

## 2022-03-29 DIAGNOSIS — M2021 Hallux rigidus, right foot: Secondary | ICD-10-CM | POA: Diagnosis not present

## 2022-03-29 MED ORDER — MELOXICAM 15 MG PO TABS: 15.0000 mg | ORAL_TABLET | Freq: Every day | ORAL | 1 refills | Status: DC | PRN

## 2022-03-29 NOTE — Progress Notes (Unsigned)
Subjective:   Patient ID: Susan Welch, female   DOB: 77 y.o.   MRN: [id number]   HPI Chief Complaint  Patient presents with   Foot Pain    Pain located in the right foot, pain located in the midfoot and great hallux, patient stated that it feels as though hot water has been thrown on her foot    The right foot is stopping her from walking. The right is wrse than the left. It has been ongoing for years but hte last year she is having more pain is not able to walk much. It is on the 1st MTPJ. No injuries. No recent reatment other than taking tylenol. She had taken meloxicam which helped tendentiously.   Previous injected helped only a little.   ROS      Objective:  Physical Exam  ***     Assessment:  ***     Plan:  ***    Prescribed mobic. Discussed side effects of the medication and directed to stop if any are to occur and call the office.  -change shoes 0then insert if needed

## 2022-04-15 ENCOUNTER — Telehealth: Payer: Self-pay | Admitting: Nurse Practitioner

## 2022-04-15 NOTE — Telephone Encounter (Signed)
Contacted [name] to schedule their annual wellness visit. Appointment made for 04/23/22.  Rudell Cobb AWV direct phone # [phone]

## 2022-04-15 NOTE — Telephone Encounter (Signed)
Called patient to schedule Medicare Annual Wellness Visit (AWV). Left message for patient to call back and schedule Medicare Annual Wellness Visit (AWV).  Last date of AWV: 08/03/20  Please schedule an appointment at any time with [facility].  If any questions, please contact me at [phone].  Thank you ,  Rudell Cobb AWV direct phone # [phone]

## 2022-04-23 ENCOUNTER — Ambulatory Visit (INDEPENDENT_AMBULATORY_CARE_PROVIDER_SITE_OTHER): Payer: Medicare Other

## 2022-04-23 VITALS — Ht 62.0 in | Wt 155.0 lb

## 2022-04-23 DIAGNOSIS — Z0000 Encounter for general adult medical examination without abnormal findings: Secondary | ICD-10-CM

## 2022-04-23 NOTE — Progress Notes (Signed)
I connected with  Susan Welch on 04/23/22 by a audio enabled telemedicine application and verified that I am speaking with the correct person using two identifiers.  Patient Location: Home  Provider Location: Office/Clinic  I discussed the limitations of evaluation and management by telemedicine. The patient expressed understanding and agreed to proceed.  Subjective:   Susan Welch is a 77 y.o. female who presents for Medicare Annual (Subsequent) preventive examination.  Review of Systems     Cardiac Risk Factors include: advanced age (>59men, >59 women);dyslipidemia;hypertension     Objective:    Today's Vitals   04/23/22 1056 04/23/22 1057  Weight: 155 lb (70.3 kg)   Height: 5\' 2"  (1.575 m)   PainSc:  3    Body mass index is 28.35 kg/m.     04/23/2022   11:03 AM 08/03/2020   11:18 AM 09/08/2014   10:29 AM 07/24/2012   11:38 PM 07/20/2012   11:00 PM 07/16/2012   11:00 AM 07/07/2012    2:53 PM  Advanced Directives  Does Patient Have a Medical Advance Directive? Yes Yes No Patient has advance directive, copy not in chart Patient does not have advance directive Patient does not have advance directive Patient does not have advance directive;Patient would like information  Type of Advance Directive Healthcare Power of [place];Living will Healthcare Power of Attorney  Living will     Does patient want to make changes to medical advance directive?  No - Patient declined       Copy of Healthcare Power of Attorney in Chart? No - copy requested No - copy requested  Copy requested from family     Would patient like information on creating a medical advance directive?   Yes - Educational materials given  Advance directive packet given Advance directive packet given Advance directive packet given  Pre-existing out of facility DNR order (yellow form or pink MOST form)    No No No     Current Medications (verified) Outpatient Encounter Medications as of 04/23/2022  Medication Sig    aspirin 81 MG tablet Take 81 mg by mouth daily.   calcium citrate-vitamin D (CITRACAL+D) 315-200 MG-UNIT per tablet Take 1 tablet by mouth daily.   cetirizine (ZYRTEC) 10 MG tablet Take 10 mg by mouth daily.   chlorthalidone (HYGROTON) 25 MG tablet TAKE 1 TABLET(25 MG) BY MOUTH DAILY   Cholecalciferol (VITAMIN D) 2000 UNITS tablet Take 1 tablet (2,000 Units total) by mouth daily.   Coenzyme Q10 (CO Q10) 200 MG CAPS Take 200 mg by mouth daily.   Evolocumab (REPATHA) 140 MG/ML SOSY Inject 140 mg into the skin every 14 (fourteen) days.   ezetimibe (ZETIA) 10 MG tablet TAKE 1 TABLET(10 MG) BY MOUTH DAILY   folic acid (FOLVITE) 400 MCG tablet Take by mouth daily.    lisinopril (ZESTRIL) 10 MG tablet TAKE 1 TABLET BY MOUTH TWICE DAILY   meloxicam (MOBIC) 15 MG tablet Take 1 tablet (15 mg total) by mouth daily as needed for pain.   metoprolol tartrate (LOPRESSOR) 25 MG tablet Take 1 tablet (25 mg total) by mouth 2 (two) times daily.   mometasone (ELOCON) 0.1 % cream Apply small amount to skin lesions once a day as needed.   omeprazole (PRILOSEC) 10 MG capsule Take 10 mg by mouth daily.   loratadine (CLARITIN) 10 MG tablet Take 10 mg by mouth daily. (Patient not taking: Reported on 04/23/2022)   No facility-administered encounter medications on file as of 04/23/2022.    Allergies (verified)  Oxycodone, Crestor [rosuvastatin], Latex, Pravastatin, Statins, and Nickel   History: Past Medical History:  Diagnosis Date   Anemia    Anxiety    Hx: of situational anxiety   Arthritis    a. bilat knees s/p R TKA 07/15/2012   Carotid stenosis    Pre-CABG Dopplers 07/19/12: Right 40-59%.   Coronary artery disease    a. s/p VF arrest during TKR 7/14 2/2 NSTEMI => LHC with 3v CAD =>  s/p CABG 07/21/12 (LIMA-LAD, SVG-RI and OM1, SVG-distal RCA).    Coronary artery disease involving native coronary artery of native heart without angina pectoris 06/17/2016   DJD (degenerative joint disease) of knee 09/21/2014   DVT  (deep venous thrombosis) 07/2012   GERD (gastroesophageal reflux disease)    Headache(784.0)    Hx: of migraines   HTN (hypertension) 09/20/2011   Hyperlipidemia    Hypertension    Ischemic cardiomyopathy    Echo 07/19/12: EF 40-45%, basal to mid anteroseptal AK, distal anteroseptal HK, mild MR.    Knee joint replacement by other means 07/26/2012   Osteoarthritis of right knee 07/16/2012   Panic attacks    Pneumonia    Serum potassium elevated 12/14/2015   Ventricular fibrillation    a. 07/15/2012 in setting of R TKA   Wears glasses    Past Surgical History:  Procedure Laterality Date   ABDOMINAL HYSTERECTOMY     CORONARY ARTERY BYPASS GRAFT N/A 07/21/2012   Procedure: CORONARY ARTERY BYPASS GRAFTING (CABG);  Surgeon: Delight Ovens, MD;  Location: [facility] OR;  Service: Open Heart Surgery;  Laterality: N/A;  Times 4 using left internal mammary artery and endoscopically harvested left saphenous vein.   DILATION AND CURETTAGE OF UTERUS     INTRAOPERATIVE TRANSESOPHAGEAL ECHOCARDIOGRAM N/A 07/21/2012   Procedure: INTRAOPERATIVE TRANSESOPHAGEAL ECHOCARDIOGRAM;  Surgeon: Delight Ovens, MD;  Location: [facility] OR;  Service: Open Heart Surgery;  Laterality: N/A;   LEFT HEART CATHETERIZATION WITH CORONARY ANGIOGRAM N/A 07/17/2012   Procedure: LEFT HEART CATHETERIZATION WITH CORONARY ANGIOGRAM;  Surgeon: Rollene Rotunda, MD;  Location[facility] C[facility] LAB;  Service: Cardiovascular;  Laterality: N/A;   TOTAL KNEE ARTHROPLASTY Right 07/15/2012   Procedure: TOTAL KNEE ARTHROPLASTY;  Surgeon: Loreta Ave, MD;  Location: [facility] OR;  Service: Orthopedics;  Laterality: Right;  drapes pulled back to provide cpr, new drape applied, protocol followed   TOTAL KNEE ARTHROPLASTY Left 09/21/2014   Procedure: TOTAL LEFT KNEE ARTHROPLASTY;  Surgeon: Loreta Ave, MD;  Location: [facility] OR;  Service: Orthopedics;  Laterality: Left;   Family History  Problem Relation Age of Onset   Thyroid disease Mother        has thyroid removed    Colitis Mother    COPD Sister    Social History   Socioeconomic History   Marital status: Widowed    Spouse name: Not on file   Number of children: Not on file   Years of education: Not on file   Highest education level: Not on file  Occupational History   Occupation: Advertising account executive    Employer: [employer]  Tobacco Use   Smoking status: Former    Types: Cigarettes    Quit date: 01/08/2000    Years since quitting: 22.3   Smokeless tobacco: Never  Vaping Use   Vaping Use: Never used  Substance and Sexual Activity   Alcohol use: Yes    Comment: "occasional wine"   Drug use: No   Sexual activity: Not Currently    Birth control/protection:  Post-menopausal  Other Topics Concern   Not on file  Social History Narrative   Marital status: Divorced and widowed.      Children: 2 children; 6 grandchildren.      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       Alcohol: none       Exercise: every other day; waling 2 miles; OA limiting activity.      ADLs: independent with ADLs.   Social Determinants of Health   Financial Resource Strain: Low Risk  (04/23/2022)   Overall Financial Resource Strain (CARDIA)    Difficulty of Paying Living Expenses: Not hard at all  Food Insecurity: No Food Insecurity (04/23/2022)   Hunger Vital Sign    Worried About Running Out of Food in the Last Year: Never true    Ran Out of Food in the Last Year: Never true  Transportation Needs: No Transportation Needs (04/23/2022)   PRAPARE - Administrator, Civil Service (Medical): No    Lack of Transportation (Non-Medical): No  Physical Activity: Inactive (04/23/2022)   Exercise Vital Sign    Days of Exercise per Week: 0 days    Minutes of Exercise per Session: 0 min  Stress: No Stress Concern Present (04/23/2022)   [employer] of Occupational Health - Occupational Stress Questionnaire     Feeling of Stress : Not at all  Social Connections: Moderately Integrated (08/03/2020)   Social Connection and Isolation Panel [NHANES]    Frequency of Communication with Friends and Family: More than three times a week    Frequency of Social Gatherings with Friends and Family: More than three times a week    Attends Religious Services: More than 4 times per year    Active Member of [employer] or Organizations: Yes    Attends Engineer, structural: More than 4 times per year    Marital Status: Divorced    Tobacco Counseling Counseling given: Not Answered   Clinical Intake:  Pre-visit preparation completed: Yes  Pain : 0-10 Pain Score: 3  Pain Type: Chronic pain Pain Location: Foot Pain Orientation: Left, Right Pain Descriptors / Indicators: Aching Pain Onset: More than a month ago Pain Frequency: Constant     Nutritional Status: BMI 25 -29 Overweight Nutritional Risks: None Diabetes: No  How often do you need to have someone help you when you read instructions, pamphlets, or other written materials from your doctor or pharmacy?: 1 - Never  Diabetic? no  Interpreter Needed?: No  Information entered by :: NAllen LPN   Activities of Daily Living    04/23/2022   11:05 AM  In your present state of health, do you have any difficulty performing the following activities:  Hearing? 0  Vision? 0  Difficulty concentrating or making decisions? 0  Walking or climbing stairs? 1  Dressing or bathing? 0  Doing errands, shopping? 0  Preparing Food and eating ? N  Using the Toilet? N  In the past six months, have you accidently leaked urine? N  Do you have problems with loss of bowel control? N  Managing your Medications? N  Managing your Finances? N  Housekeeping or managing your Housekeeping? N    Patient Care Team: Early, Sung Amabile, NP as PCP - General (Nurse Practitioner) Rollene Rotunda, MD as PCP - Cardiology (Cardiology) Pati Gallo, MD as Attending Physician  (  Family Medicine) Loreta Ave, MD (Inactive) as Consulting Physician (Orthopedic Surgery) Rollene Rotunda, MD as Consulting Physician (Cardiology) Collene Gobble, MD (Emergency Medicine)  Indicate any recent Medical Services you may have received from other than [facility] providers in the past year (date may be approximate).     Assessment:   This is a routine wellness examination for [employer].  Hearing/Vision screen Vision Screening - Comments:: Regular eye exams, [facility]  Dietary issues and exercise activities discussed: Current Exercise Habits: Home exercise routine, Type of exercise: walking, Time (Minutes): 20, Frequency (Times/Week): 5, Weekly Exercise (Minutes/Week): 100   Goals Addressed             This Visit's Progress    Patient Stated       04/23/2022, working on increasing exercise       Depression Screen    04/23/2022   11:04 AM 08/03/2020   11:20 AM 05/02/2020    2:49 PM 05/15/2016    8:38 AM 08/01/2014    5:37 PM 09/09/2012    1:32 PM  PHQ 2/9 Scores  PHQ - 2 Score 0 0 2 0 0 0  PHQ- 9 Score 1  4       Fall Risk    04/23/2022   11:04 AM 08/03/2020   11:19 AM 05/02/2020    2:49 PM 05/15/2016    8:38 AM 08/01/2014    5:38 PM  Fall Risk   Falls in the past year? 0 0 1 Yes Yes  Number falls in past yr: 0 0 0 1 2 or more  Injury with Fall? 0 0  No Yes  Risk Factor Category      High Fall Risk  Risk for fall due to : Medication side effect No Fall Risks No Fall Risks    Follow up Falls prevention discussed;Education provided;Falls evaluation completed Falls evaluation completed;Falls prevention discussed       FALL RISK PREVENTION PERTAINING TO THE HOME:  Any stairs in or around the home? No  If so, are there any without handrails? N/a Home free of loose throw rugs in walkways, pet beds, electrical cords, etc? Yes  Adequate lighting in your home to reduce risk of falls? Yes   ASSISTIVE DEVICES UTILIZED TO PREVENT FALLS:  Life alert? No  Use of a  cane, walker or w/c? No  Grab bars in the bathroom? Yes  Shower chair or bench in shower? No  Elevated toilet seat or a handicapped toilet? No   TIMED UP AND GO:  Was the test performed? No .      Cognitive Function:        04/23/2022   11:07 AM 08/03/2020   11:23 AM  6CIT Screen  What Year? 0 points 0 points  What month? 0 points 0 points  What time? 0 points 0 points  Count back from 20 0 points 0 points  Months in reverse 0 points 0 points  Repeat phrase 2 points 0 points  Total Score 2 points 0 points    Immunizations Immunization History  Administered Date(s) Administered   Influenza,inj,Quad PF,6+ Mos 10/29/2012, 12/21/2013   Influenza-Unspecified 12/18/2011   PFIZER(Purple Top)SARS-COV-2 Vaccination 01/25/2019, 02/15/2019, 11/15/2019   Pneumococcal Conjugate-13 08/01/2014   Pneumococcal Polysaccharide-23 04/03/2012   Tdap 01/07/2014   Varicella 01/08/1999    TDAP status: Up to date  Flu Vaccine status: Up to date  Pneumococcal vaccine status: Up to date  Covid-19 vaccine status: Completed vaccines  Qualifies for Shingles Vaccine?  Yes   Zostavax completed Yes   Shingrix Completed?: Yes  Screening Tests Health Maintenance  Topic Date Due   Hepatitis C Screening  Never done   Zoster Vaccines- Shingrix (1 of 2) Never done   Medicare Annual Wellness (AWV)  08/03/2021   COVID-19 Vaccine (4 - 2023-24 season) 09/07/2021   INFLUENZA VACCINE  08/08/2022   DTaP/Tdap/Td (2 - Td or Tdap) 01/08/2024   Pneumonia Vaccine 12+ Years old  Completed   DEXA SCAN  Completed   HPV VACCINES  Aged Out    Health Maintenance  Health Maintenance Due  Topic Date Due   Hepatitis C Screening  Never done   Zoster Vaccines- Shingrix (1 of 2) Never done   Medicare Annual Wellness (AWV)  08/03/2021   COVID-19 Vaccine (4 - 2023-24 season) 09/07/2021    Colorectal cancer screening: No longer required.   Mammogram status: No longer required due to age.  Bone Density  status: Completed 02/01/2021.   Lung Cancer Screening: (Low Dose CT Chest recommended if Age 66-80 years, 30 pack-year currently smoking OR have quit w/in 15years.) does not qualify.   Lung Cancer Screening Referral: no  Additional Screening:  Hepatitis C Screening: does qualify;   Vision Screening: Recommended annual ophthalmology exams for early detection of glaucoma and other disorders of the eye. Is the patient up to date with their annual eye exam?  Yes  Who is the provider or what is the name of the office in which the patient attends annual eye exams? [facility] If pt is not established with a provider, would they like to be referred to a provider to establish care? No .   Dental Screening: Recommended annual dental exams for proper oral hygiene  Community Resource Referral / Chronic Care Management: CRR required this visit?  No   CCM required this visit?  No      Plan:     I have personally reviewed and noted the following in the patient's chart:   Medical and social history Use of alcohol, tobacco or illicit drugs  Current medications and supplements including opioid prescriptions. Patient is not currently taking opioid prescriptions. Functional ability and status Nutritional status Physical activity Advanced directives List of other physicians Hospitalizations, surgeries, and ER visits in previous 12 months Vitals Screenings to include cognitive, depression, and falls Referrals and appointments  In addition, I have reviewed and discussed with patient certain preventive protocols, quality metrics, and best practice recommendations. A written personalized care plan for preventive services as well as general preventive health recommendations were provided to patient.     Barb Merino, LPN   1/61/0960   Nurse Notes: Due to this being a virtual visit, the after visit summary with patients personalized plan was offered to patient via mail or my-chart.   Patient would like to access on my-chart

## 2022-04-23 NOTE — Patient Instructions (Signed)
[name] , Thank you for taking time to come for your Medicare Wellness Visit. I appreciate your ongoing commitment to your health goals. Please review the following plan we discussed and let me know if I can assist you in the future.   These are the goals we discussed:  Goals       LDL CALC < 70      Patient Stated      04/23/2022, working on increasing exercise      Weight (lb) < 140 lb (63.5 kg) (pt-stated)      "I would like to loose 10 pounds. I would like to be at 140"        This is a list of the screening recommended for you and due dates:  Health Maintenance  Topic Date Due   Hepatitis C Screening: USPSTF Recommendation to screen - Ages 41-79 yo.  Never done   Zoster (Shingles) Vaccine (1 of 2) Never done   COVID-19 Vaccine (4 - 2023-24 season) 09/07/2021   Flu Shot  08/08/2022   Medicare Annual Wellness Visit  04/23/2023   DTaP/Tdap/Td vaccine (2 - Td or Tdap) 01/08/2024   Pneumonia Vaccine  Completed   DEXA scan (bone density measurement)  Completed   HPV Vaccine  Aged Out    Advanced directives: Please bring a copy of your POA (Power of [place]) and/or Living Will to your next appointment.    Conditions/risks identified: none  Next appointment: Follow up in one year for your annual wellness visit    Preventive Care 65 Years and Older, Female Preventive care refers to lifestyle choices and visits with your health care provider that can promote health and wellness. What does preventive care include? A yearly physical exam. This is also called an annual well check. Dental exams once or twice a year. Routine eye exams. Ask your health care provider how often you should have your eyes checked. Personal lifestyle choices, including: Daily care of your teeth and gums. Regular physical activity. Eating a healthy diet. Avoiding tobacco and drug use. Limiting alcohol use. Practicing safe sex. Taking low-dose aspirin every day. Taking vitamin and mineral  supplements as recommended by your health care provider. What happens during an annual well check? The services and screenings done by your health care provider during your annual well check will depend on your age, overall health, lifestyle risk factors, and family history of disease. Counseling  Your health care provider may ask you questions about your: Alcohol use. Tobacco use. Drug use. Emotional well-being. Home and relationship well-being. Sexual activity. Eating habits. History of falls. Memory and ability to understand (cognition). Work and work Astronomer. Reproductive health. Screening  You may have the following tests or measurements: Height, weight, and BMI. Blood pressure. Lipid and cholesterol levels. These may be checked every 5 years, or more frequently if you are over 62 years old. Skin check. Lung cancer screening. You may have this screening every year starting at age 63 if you have a 30-pack-year history of smoking and currently smoke or have quit within the past 15 years. Fecal occult blood test (FOBT) of the stool. You may have this test every year starting at age 56. Flexible sigmoidoscopy or colonoscopy. You may have a sigmoidoscopy every 5 years or a colonoscopy every 10 years starting at age 43. Hepatitis C blood test. Hepatitis B blood test. Sexually transmitted disease (STD) testing. Diabetes screening. This is done by checking your blood sugar (glucose) after you have not eaten for  a while (fasting). You may have this done every 1-3 years. Bone density scan. This is done to screen for osteoporosis. You may have this done starting at age 58. Mammogram. This may be done every 1-2 years. Talk to your health care provider about how often you should have regular mammograms. Talk with your health care provider about your test results, treatment options, and if necessary, the need for more tests. Vaccines  Your health care provider may recommend certain  vaccines, such as: Influenza vaccine. This is recommended every year. Tetanus, diphtheria, and acellular pertussis (Tdap, Td) vaccine. You may need a Td booster every 10 years. Zoster vaccine. You may need this after age 52. Pneumococcal 13-valent conjugate (PCV13) vaccine. One dose is recommended after age 32. Pneumococcal polysaccharide (PPSV23) vaccine. One dose is recommended after age 38. Talk to your health care provider about which screenings and vaccines you need and how often you need them. This information is not intended to replace advice given to you by your health care provider. Make sure you discuss any questions you have with your health care provider. Document Released: 01/20/2015 Document Revised: 09/13/2015 Document Reviewed: 10/25/2014 Elsevier Interactive Patient Education  2017 [place] Prevention in the Home Falls can cause injuries. They can happen to people of all ages. There are many things you can do to make your home safe and to help prevent falls. What can I do on the outside of my home? Regularly fix the edges of walkways and driveways and fix any cracks. Remove anything that might make you trip as you walk through a door, such as a raised step or threshold. Trim any bushes or trees on the path to your home. Use bright outdoor lighting. Clear any walking paths of anything that might make someone trip, such as rocks or tools. Regularly check to see if handrails are loose or broken. Make sure that both sides of any steps have handrails. Any raised decks and porches should have guardrails on the edges. Have any leaves, snow, or ice cleared regularly. Use sand or salt on walking paths during winter. Clean up any spills in your garage right away. This includes oil or grease spills. What can I do in the bathroom? Use night lights. Install grab bars by the toilet and in the tub and shower. Do not use towel bars as grab bars. Use non-skid mats or decals in  the tub or shower. If you need to sit down in the shower, use a plastic, non-slip stool. Keep the floor dry. Clean up any water that spills on the floor as soon as it happens. Remove soap buildup in the tub or shower regularly. Attach bath mats securely with double-sided non-slip rug tape. Do not have throw rugs and other things on the floor that can make you trip. What can I do in the bedroom? Use night lights. Make sure that you have a light by your bed that is easy to reach. Do not use any sheets or blankets that are too big for your bed. They should not hang down onto the floor. Have a firm chair that has side arms. You can use this for support while you get dressed. Do not have throw rugs and other things on the floor that can make you trip. What can I do in the kitchen? Clean up any spills right away. Avoid walking on wet floors. Keep items that you use a lot in easy-to-reach places. If you need to reach something above  you, use a strong step stool that has a grab bar. Keep electrical cords out of the way. Do not use floor polish or wax that makes floors slippery. If you must use wax, use non-skid floor wax. Do not have throw rugs and other things on the floor that can make you trip. What can I do with my stairs? Do not leave any items on the stairs. Make sure that there are handrails on both sides of the stairs and use them. Fix handrails that are broken or loose. Make sure that handrails are as long as the stairways. Check any carpeting to make sure that it is firmly attached to the stairs. Fix any carpet that is loose or worn. Avoid having throw rugs at the top or bottom of the stairs. If you do have throw rugs, attach them to the floor with carpet tape. Make sure that you have a light switch at the top of the stairs and the bottom of the stairs. If you do not have them, ask someone to add them for you. What else can I do to help prevent falls? Wear shoes that: Do not have high  heels. Have rubber bottoms. Are comfortable and fit you well. Are closed at the toe. Do not wear sandals. If you use a stepladder: Make sure that it is fully opened. Do not climb a closed stepladder. Make sure that both sides of the stepladder are locked into place. Ask someone to hold it for you, if possible. Clearly mark and make sure that you can see: Any grab bars or handrails. First and last steps. Where the edge of each step is. Use tools that help you move around (mobility aids) if they are needed. These include: Canes. Walkers. Scooters. Crutches. Turn on the lights when you go into a dark area. Replace any light bulbs as soon as they burn out. Set up your furniture so you have a clear path. Avoid moving your furniture around. If any of your floors are uneven, fix them. If there are any pets around you, be aware of where they are. Review your medicines with your doctor. Some medicines can make you feel dizzy. This can increase your chance of falling. Ask your doctor what other things that you can do to help prevent falls. This information is not intended to replace advice given to you by your health care provider. Make sure you discuss any questions you have with your health care provider. Document Released: 10/20/2008 Document Revised: 06/01/2015 Document Reviewed: 01/28/2014 Elsevier Interactive Patient Education  2017 [employer].

## 2022-05-13 ENCOUNTER — Ambulatory Visit (INDEPENDENT_AMBULATORY_CARE_PROVIDER_SITE_OTHER): Payer: Medicare Other | Admitting: Nurse Practitioner

## 2022-05-13 ENCOUNTER — Encounter: Payer: Self-pay | Admitting: Nurse Practitioner

## 2022-05-13 VITALS — BP 128/80 | HR 64 | Ht 62.5 in | Wt 158.0 lb

## 2022-05-13 DIAGNOSIS — Z0000 Encounter for general adult medical examination without abnormal findings: Secondary | ICD-10-CM

## 2022-05-13 DIAGNOSIS — I429 Cardiomyopathy, unspecified: Secondary | ICD-10-CM

## 2022-05-13 DIAGNOSIS — I2510 Atherosclerotic heart disease of native coronary artery without angina pectoris: Secondary | ICD-10-CM

## 2022-05-13 DIAGNOSIS — M85852 Other specified disorders of bone density and structure, left thigh: Secondary | ICD-10-CM

## 2022-05-13 DIAGNOSIS — M159 Polyosteoarthritis, unspecified: Secondary | ICD-10-CM | POA: Diagnosis not present

## 2022-05-13 DIAGNOSIS — E785 Hyperlipidemia, unspecified: Secondary | ICD-10-CM

## 2022-05-13 DIAGNOSIS — I10 Essential (primary) hypertension: Secondary | ICD-10-CM

## 2022-05-13 DIAGNOSIS — M85851 Other specified disorders of bone density and structure, right thigh: Secondary | ICD-10-CM | POA: Diagnosis not present

## 2022-05-13 LAB — CBC WITH DIFFERENTIAL/PLATELET
Basophils Absolute: 0.1 10*3/uL (ref 0.0–0.2)
Basos: 1 %
EOS (ABSOLUTE): 0.2 10*3/uL (ref 0.0–0.4)
Hematocrit: 41.8 % (ref 34.0–46.6)
Lymphs: 25 %
MCHC: 33.3 g/dL (ref 31.5–35.7)
Monocytes Absolute: 0.7 10*3/uL (ref 0.1–0.9)
Monocytes: 8 %
Neutrophils: 64 %
WBC: 9.6 10*3/uL (ref 3.4–10.8)

## 2022-05-13 LAB — COMPREHENSIVE METABOLIC PANEL

## 2022-05-13 MED ORDER — LISINOPRIL 10 MG PO TABS: 10.0000 mg | ORAL_TABLET | Freq: Two times a day (BID) | ORAL | 3 refills | Status: DC

## 2022-05-13 MED ORDER — METOPROLOL TARTRATE 25 MG PO TABS: 25.0000 mg | ORAL_TABLET | Freq: Two times a day (BID) | ORAL | 3 refills | Status: DC

## 2022-05-13 NOTE — Patient Instructions (Addendum)
I will be in touch with you about your labs in the next week.    For all adult patients, I recommend A well balanced diet low in saturated fats, cholesterol, and moderation in carbohydrates.   This can be as simple as monitoring portion sizes and cutting back on sugary beverages such as soda and juice to start with.    Daily water consumption of at least 64 ounces.  Physical activity at least 180 minutes per week, if just starting out.   This can be as simple as taking the stairs instead of the elevator and walking 2-3 laps around the office  purposefully every day.   STD protection, partner selection, and regular testing if high risk.  Limited consumption of alcoholic beverages if alcohol is consumed.  For women, I recommend no more than 7 alcoholic beverages per week, spread out throughout the week.  Avoid "binge" drinking or consuming large quantities of alcohol in one setting.   Please let me know if you feel you may need help with reduction or quitting alcohol consumption.   Avoidance of nicotine, if used.  Please let me know if you feel you may need help with reduction or quitting nicotine use.   Daily mental health attention.  This can be in the form of 5 minute daily meditation, prayer, journaling, yoga, reflection, etc.   Purposeful attention to your emotions and mental state can significantly improve your overall wellbeing  and  Health.  Please know that I am here to help you with all of your health care goals and am happy to work with you to find a solution that works best for you.  The greatest advice I have received with any changes in life are to take it one step at a time, that even means if all you can focus on is the next 60 seconds, then do that and celebrate your victories.  With any changes in life, you will have set backs, and that is OK. The important thing to remember is, if you have a set back, it is not a failure, it is an opportunity to try again!  Health  Maintenance Recommendations Screening Testing Mammogram Every 1 -2 years based on history and risk factors Starting at age 46 Pap Smear Ages 21-39 every 3 years Ages 58-65 every 5 years with HPV testing More frequent testing may be required based on results and history Colon Cancer Screening Every 1-10 years based on test performed, risk factors, and history Starting at age 33 Bone Density Screening Every 2-10 years based on history Starting at age 46 for women Recommendations for men differ based on medication usage, history, and risk factors AAA Screening One time ultrasound Men 46-28 years old who have every smoked Lung Cancer Screening Low Dose Lung CT every 12 months Age 24-80 years with a 30 pack-year smoking history who still smoke or who have quit within the last 15 years  Screening Labs Routine  Labs: Complete Blood Count (CBC), Complete Metabolic Panel (CMP), Cholesterol (Lipid Panel) Every 6-12 months based on history and medications May be recommended more frequently based on current conditions or previous results Hemoglobin A1c Lab Every 3-12 months based on history and previous results Starting at age 89 or earlier with diagnosis of diabetes, high cholesterol, BMI >26, and/or risk factors Frequent monitoring for patients with diabetes to ensure blood sugar control Thyroid Panel (TSH w/ T3 & T4) Every 6 months based on history, symptoms, and risk factors May be repeated more  often if on medication HIV One time testing for all patients 13 and older May be repeated more frequently for patients with increased risk factors or exposure Hepatitis C One time testing for all patients 63 and older May be repeated more frequently for patients with increased risk factors or exposure Gonorrhea, Chlamydia Every 12 months for all sexually active persons 13-24 years Additional monitoring may be recommended for those who are considered high risk or who have symptoms PSA Men  44-41 years old with risk factors Additional screening may be recommended from age 105-69 based on risk factors, symptoms, and history  Vaccine Recommendations Tetanus Booster All adults every 10 years Flu Vaccine All patients 6 months and older every year COVID Vaccine All patients 12 years and older Initial dosing with booster May recommend additional booster based on age and health history HPV Vaccine 2 doses all patients age 48-26 Dosing may be considered for patients over 26 Shingles Vaccine (Shingrix) 2 doses all adults 55 years and older Pneumonia (Pneumovax 23) All adults 65 years and older May recommend earlier dosing based on health history Pneumonia (Prevnar 66) All adults 65 years and older Dosed 1 year after Pneumovax 23  Additional Screening, Testing, and Vaccinations may be recommended on an individualized basis based on family history, health history, risk factors, and/or exposure.

## 2022-05-13 NOTE — Progress Notes (Signed)
Susan Clamp, DNP, AGNP-c [facility] [address] [place], [place] [address] Main Office [phone]  BP 128/80   Pulse 64   Ht 5' 2.5" (1.588 m)   Wt 158 lb (71.7 kg)   BMI 28.44 kg/m    Subjective:    Patient ID: Susan Welch, female    DOB: 02-10-1945, 77 y.o.   MRN: [id number]  HPI: Susan Welch is a 77 y.o. female presenting on 05/13/2022 for comprehensive medical examination.   Current medical concerns include: Genine requests today for a form to be filled out for allowing her pet to remain in her apartment complex.  Her current pet is utilized for emotional support following exacerbation of anxiety and depression symptoms associated with recent loss.  She reports that her pet has played a significant role in managing her grief.  She wishes to be allowed to keep the pet in her home for ongoing emotional support.  Pertinent items are noted in HPI.  IMMUNIZATIONS:   Flu: Flu vaccine postponed until flu season Prevnar 13: Prevnar 13 completed, documentation in chart Prevnar 20: Prevnar 20 completed, documentation in chart Pneumovax 23: Pneumovax completed, documentation in chart Vac Shingrix: Shingrix completed, documentation in chart (Dose # 2/2) HPV: HPV N/A for this patient Tetanus: Tetanus completed in the last 10 years COVID: COVID declined, patient does not wish to have this vaccine  HEALTH MAINTENANCE: Pap Smear HM Status: is up to date Mammogram HM Status: is up to date Colon Cancer Screening HM Status: is up to date Bone Density HM Status: is up to date STI Testing HM Status: is not applicable for this patient Lung CT HM Status: is not applicable for this patient  She reports regular vision exams q1-5y: Yes  She reports regular dental exams q 19m:  Yes  The patient eats a regular, healthy diet. She endorses exercise and/or activity of: Active 30 + min 4+ x/wk Mode: walking with Cassie (her dog)   Most Recent Depression Screen:      05/13/2022    3:04 PM 04/23/2022   11:04 AM 08/03/2020   11:20 AM 05/02/2020    2:49 PM 05/15/2016    8:38 AM  Depression screen PHQ 2/9  Decreased Interest 0 0 0 1 0  Down, Depressed, Hopeless 0 0 0 1 0  PHQ - 2 Score 0 0 0 2 0  Altered sleeping  1  1   Tired, decreased energy  0  1   Change in appetite  0  0   Feeling bad or failure about yourself   0  0   Trouble concentrating  0  0   Moving slowly or fidgety/restless  0  0   Suicidal thoughts  0  0   PHQ-9 Score  1  4   Difficult doing work/chores  Not difficult at all  Not difficult at all    Most Recent Anxiety Screen:     05/02/2020    2:50 PM  GAD 7 : Generalized Anxiety Score  Nervous, Anxious, on Edge 0  Control/stop worrying 0  Worry too much - different things 0  Trouble relaxing 0  Restless 0  Easily annoyed or irritable 0  Afraid - awful might happen 0  Total GAD 7 Score 0   Most Recent Fall Screen:    05/13/2022    3:04 PM 04/23/2022   11:04 AM 08/03/2020   11:19 AM 05/02/2020    2:49 PM 05/15/2016    8:38 AM  Fall Risk  Falls in the past year? 0 0 0 1 Yes  Number falls in past yr: 0 0 0 0 1  Injury with Fall? 0 0 0  No  Risk for fall due to : No Fall Risks Medication side effect No Fall Risks No Fall Risks   Follow up Falls evaluation completed Falls prevention discussed;Education provided;Falls evaluation completed Falls evaluation completed;Falls prevention discussed      Past medical history, surgical history, medications, allergies, family history and social history reviewed with patient today and changes made to appropriate areas of the chart.  Past Medical History:  Past Medical History:  Diagnosis Date   Anemia    Anxiety    Hx: of situational anxiety   Arthritis    a. bilat knees s/p R TKA 07/15/2012   Carotid stenosis    Pre-CABG Dopplers 07/19/12: Right 40-59%.   Coronary artery disease    a. s/p VF arrest during TKR 7/14 2/2 NSTEMI => LHC with 3v CAD =>  s/p CABG 07/21/12 (LIMA-LAD, SVG-RI and  OM1, SVG-distal RCA).    Coronary artery disease involving native coronary artery of native heart without angina pectoris 06/17/2016   DJD (degenerative joint disease) of knee 09/21/2014   DVT (deep venous thrombosis) (HCC) 07/2012   Encounter to establish care 05/03/2020   GERD (gastroesophageal reflux disease)    Headache(784.0)    Hx: of migraines   HTN (hypertension) 09/20/2011   Hyperlipidemia    Hypertension    Ischemic cardiomyopathy    Echo 07/19/12: EF 40-45%, basal to mid anteroseptal AK, distal anteroseptal HK, mild MR.    Knee joint replacement by other means 07/26/2012   Lack of interest 05/03/2020   Medication management 06/17/2016   Osteoarthritis of right knee 07/16/2012   Panic attacks    Piriformis syndrome of right side 05/03/2020   Pneumonia    Serum potassium elevated 12/14/2015   Ventricular fibrillation (HCC)    a. 07/15/2012 in setting of R TKA   Wears glasses    Medications:  Current Outpatient Medications on File Prior to Visit  Medication Sig   aspirin 81 MG tablet Take 81 mg by mouth daily.   calcium citrate-vitamin D (CITRACAL+D) 315-200 MG-UNIT per tablet Take 1 tablet by mouth daily.   cetirizine (ZYRTEC) 10 MG tablet Take 10 mg by mouth daily.   chlorthalidone (HYGROTON) 25 MG tablet TAKE 1 TABLET(25 MG) BY MOUTH DAILY   Cholecalciferol (VITAMIN D) 2000 UNITS tablet Take 1 tablet (2,000 Units total) by mouth daily.   Coenzyme Q10 (CO Q10) 200 MG CAPS Take 200 mg by mouth daily.   Evolocumab (REPATHA) 140 MG/ML SOSY Inject 140 mg into the skin every 14 (fourteen) days.   ezetimibe (ZETIA) 10 MG tablet TAKE 1 TABLET(10 MG) BY MOUTH DAILY   folic acid (FOLVITE) 400 MCG tablet Take by mouth daily.    meloxicam (MOBIC) 15 MG tablet Take 1 tablet (15 mg total) by mouth daily as needed for pain.   mometasone (ELOCON) 0.1 % cream Apply small amount to skin lesions once a day as needed.   omeprazole (PRILOSEC) 10 MG capsule Take 10 mg by mouth daily.    loratadine (CLARITIN) 10 MG tablet Take 10 mg by mouth daily. (Patient not taking: Reported on 04/23/2022)   No current facility-administered medications on file prior to visit.   Surgical History:  Past Surgical History:  Procedure Laterality Date   ABDOMINAL HYSTERECTOMY     CORONARY ARTERY BYPASS GRAFT N/A 07/21/2012   Procedure:  CORONARY ARTERY BYPASS GRAFTING (CABG);  Surgeon: Delight Ovens, MD;  Location: [facility] OR;  Service: Open Heart Surgery;  Laterality: N/A;  Times 4 using left internal mammary artery and endoscopically harvested left saphenous vein.   DILATION AND CURETTAGE OF UTERUS     INTRAOPERATIVE TRANSESOPHAGEAL ECHOCARDIOGRAM N/A 07/21/2012   Procedure: INTRAOPERATIVE TRANSESOPHAGEAL ECHOCARDIOGRAM;  Surgeon: Delight Ovens, MD;  Location: [facility] OR;  Service: Open Heart Surgery;  Laterality: N/A;   LEFT HEART CATHETERIZATION WITH CORONARY ANGIOGRAM N/A 07/17/2012   Procedure: LEFT HEART CATHETERIZATION WITH CORONARY ANGIOGRAM;  Surgeon: Rollene Rotunda, MD;  Location: [facility] CATH LAB;  Service: Cardiovascular;  Laterality: N/A;   TOTAL KNEE ARTHROPLASTY Right 07/15/2012   Procedure: TOTAL KNEE ARTHROPLASTY;  Surgeon: Loreta Ave, MD;  Location: [facility] OR;  Service: Orthopedics;  Laterality: Right;  drapes pulled back to provide cpr, new drape applied, protocol followed   TOTAL KNEE ARTHROPLASTY Left 09/21/2014   Procedure: TOTAL LEFT KNEE ARTHROPLASTY;  Surgeon: Loreta Ave, MD;  Location: [facility] OR;  Service: Orthopedics;  Laterality: Left;   Allergies:  Allergies  Allergen Reactions   Oxycodone Other (See Comments)    Flu like symptoms.   Crestor [Rosuvastatin]     myalgias   Latex    Pravastatin     myaglias   Statins Other (See Comments)    Joint pain with Lipitor daily, Pravastatin 10 mg qd and Pravastatin 10 mg M/W/F, Crestor 10 mg once weekly also caused joint aches   Nickel Rash   Family History:  Family History  Problem Relation Age of Onset   Thyroid disease  Mother        has thyroid removed   Colitis Mother    COPD Sister        Objective:    BP 128/80   Pulse 64   Ht 5' 2.5" (1.588 m)   Wt 158 lb (71.7 kg)   BMI 28.44 kg/m   Wt Readings from Last 3 Encounters:  05/13/22 158 lb (71.7 kg)  04/23/22 155 lb (70.3 kg)  10/15/21 156 lb 3.2 oz (70.9 kg)    Physical Exam Vitals and nursing note reviewed.  Constitutional:      General: She is not in acute distress.    Appearance: Normal appearance.  HENT:     Head: Normocephalic and atraumatic.     Right Ear: Hearing, tympanic membrane, ear canal and external ear normal.     Left Ear: Hearing, tympanic membrane, ear canal and external ear normal.     Nose: Nose normal.     Right Sinus: No maxillary sinus tenderness or frontal sinus tenderness.     Left Sinus: No maxillary sinus tenderness or frontal sinus tenderness.     Mouth/Throat:     Lips: Pink.     Mouth: Mucous membranes are moist.     Pharynx: Oropharynx is clear.  Eyes:     General: Lids are normal. Vision grossly intact.     Extraocular Movements: Extraocular movements intact.     Conjunctiva/sclera: Conjunctivae normal.     Pupils: Pupils are equal, round, and reactive to light.     Funduscopic exam:    Right eye: Red reflex present.        Left eye: Red reflex present.    Visual Fields: Right eye visual fields normal and left eye visual fields normal.  Neck:     Thyroid: No thyromegaly.     Vascular: No carotid bruit.  Cardiovascular:  Rate and Rhythm: Normal rate and regular rhythm.     Chest Wall: PMI is not displaced.     Pulses: Normal pulses.          Dorsalis pedis pulses are 2+ on the right side and 2+ on the left side.       Posterior tibial pulses are 2+ on the right side and 2+ on the left side.     Heart sounds: Normal heart sounds. No murmur heard. Pulmonary:     Effort: Pulmonary effort is normal. No respiratory distress.     Breath sounds: Normal breath sounds.  Abdominal:     General:  Abdomen is flat. Bowel sounds are normal. There is no distension.     Palpations: Abdomen is soft. There is no hepatomegaly, splenomegaly or mass.     Tenderness: There is no abdominal tenderness. There is no right CVA tenderness, left CVA tenderness, guarding or rebound.  Musculoskeletal:        General: Normal range of motion.     Cervical back: Full passive range of motion without pain, normal range of motion and neck supple. No tenderness.     Right lower leg: No edema.     Left lower leg: No edema.  Feet:     Left foot:     Toenail Condition: Left toenails are normal.  Lymphadenopathy:     Cervical: No cervical adenopathy.     Upper Body:     Right upper body: No supraclavicular adenopathy.     Left upper body: No supraclavicular adenopathy.  Skin:    General: Skin is warm and dry.     Capillary Refill: Capillary refill takes less than 2 seconds.     Nails: There is no clubbing.  Neurological:     General: No focal deficit present.     Mental Status: She is alert and oriented to person, place, and time.     GCS: GCS eye subscore is 4. GCS verbal subscore is 5. GCS motor subscore is 6.     Sensory: Sensation is intact.     Motor: Motor function is intact.     Coordination: Coordination is intact.     Gait: Gait is intact.     Deep Tendon Reflexes: Reflexes are normal and symmetric.  Psychiatric:        Attention and Perception: Attention normal.        Mood and Affect: Mood normal.        Speech: Speech normal.        Behavior: Behavior normal. Behavior is cooperative.        Thought Content: Thought content normal.        Cognition and Memory: Cognition and memory normal.        Judgment: Judgment normal.     Results for orders placed or performed in visit on 05/13/22  CBC with Differential/Platelet  Result Value Ref Range   WBC 9.6 3.4 - 10.8 x10E3/uL   RBC 4.78 3.77 - 5.28 x10E6/uL   Hemoglobin 13.9 11.1 - 15.9 g/dL   Hematocrit 16.1 09.6 - 46.6 %   MCV 87 79 -  97 fL   MCH 29.1 26.6 - 33.0 pg   MCHC 33.3 31.5 - 35.7 g/dL   RDW 04.5 40.9 - 81.1 %   Platelets 335 150 - 450 x10E3/uL   Neutrophils 64 Not Estab. %   Lymphs 25 Not Estab. %   Monocytes 8 Not Estab. %   Eos 2 Not  Estab. %   Basos 1 Not Estab. %   Neutrophils Absolute 6.2 1.4 - 7.0 x10E3/uL   Lymphocytes Absolute 2.3 0.7 - 3.1 x10E3/uL   Monocytes Absolute 0.7 0.1 - 0.9 x10E3/uL   EOS (ABSOLUTE) 0.2 0.0 - 0.4 x10E3/uL   Basophils Absolute 0.1 0.0 - 0.2 x10E3/uL   Immature Granulocytes 0 Not Estab. %   Immature Grans (Abs) 0.0 0.0 - 0.1 x10E3/uL  Comprehensive metabolic panel  Result Value Ref Range   Glucose 84 70 - 99 mg/dL   BUN 26 8 - 27 mg/dL   Creatinine, Ser 1.61 (H) 0.57 - 1.00 mg/dL   eGFR 54 (L) >09 UE/AVW/0.98   BUN/Creatinine Ratio 24 12 - 28   Sodium 140 134 - 144 mmol/L   Potassium 5.0 3.5 - 5.2 mmol/L   Chloride 99 96 - 106 mmol/L   CO2 24 20 - 29 mmol/L   Calcium 10.3 8.7 - 10.3 mg/dL   Total Protein 7.4 6.0 - 8.5 g/dL   Albumin 4.8 3.8 - 4.8 g/dL   Globulin, Total 2.6 1.5 - 4.5 g/dL   Albumin/Globulin Ratio 1.8 1.2 - 2.2   Bilirubin Total 0.4 0.0 - 1.2 mg/dL   Alkaline Phosphatase 60 44 - 121 IU/L   AST 24 0 - 40 IU/L   ALT 18 0 - 32 IU/L  VITAMIN D 25 Hydroxy (Vit-D Deficiency, Fractures)  Result Value Ref Range   Vit D, 25-Hydroxy 103.0 (H) 30.0 - 100.0 ng/mL         Assessment & Plan:   Problem List Items Addressed This Visit     HTN (hypertension)    Chronic hypertension in the setting of comorbidities of hyperlipidemia.  Currently symptoms are well-controlled.  Patient is currently on lisinopril, metoprolol, and chlorthalidone for management.  No alarm symptoms are present at this time.  Recommend continue current therapy.      Relevant Medications   lisinopril (ZESTRIL) 10 MG tablet   metoprolol tartrate (LOPRESSOR) 25 MG tablet   Other Relevant Orders   CBC with Differential/Platelet (Completed)   Comprehensive metabolic panel  (Completed)   VITAMIN D 25 Hydroxy (Vit-D Deficiency, Fractures) (Completed)   Coronary artery disease involving native coronary artery of native heart without angina pectoris    Chronic coronary artery disease in the setting of hypertension and hyperlipidemia.  Patient is currently managing her lipids with Zetia as she does have a statin intolerance with myalgias.  Blood pressures are well-controlled with lisinopril, Toprol all, and chlorthalidone.  She is currently on aspirin therapy daily.  No alarm symptoms are present at this time. Plan: - Continue current medication management - Continue with cardiology management.      Relevant Medications   lisinopril (ZESTRIL) 10 MG tablet   metoprolol tartrate (LOPRESSOR) 25 MG tablet   Dyslipidemia    Chronic dyslipidemia in the setting of coronary artery disease.  Patient is known to be intolerant of Lipitor, Crestor, and pravastatin all with daily and once weekly dosing.  At this time she is currently managed with Zetia.  Recommend strict dietary adherence for optimal control and close monitoring of labs.      Cardiomyopathy (HCC)    Chronic with no reported symptoms at this time.  Labs reviewed with patient today.  Continue with diet and exercise management as well as lipid and blood pressure control.  Continue to follow with cardiology as needed.      Relevant Medications   lisinopril (ZESTRIL) 10 MG tablet   metoprolol  tartrate (LOPRESSOR) 25 MG tablet   Other Relevant Orders   CBC with Differential/Platelet (Completed)   Comprehensive metabolic panel (Completed)   VITAMIN D 25 Hydroxy (Vit-D Deficiency, Fractures) (Completed)   Encounter for annual physical exam - Primary    CPE today.  Labs pending. Will make changes as necessary based on results.  Review of HM activities and recommendations discussed and provided on AVS Anticipatory guidance, diet, and exercise recommendations provided.  Medications, allergies, and hx reviewed and  updated as necessary.  Plan to f/u with CPE in 1 year or sooner for acute/chronic health needs as directed.        Primary osteoarthritis involving multiple joints    History of chronic osteoarthritis currently managed with as needed meloxicam.  No alarm symptoms present at this time.  Will continue to monitor.      Relevant Orders   CBC with Differential/Platelet (Completed)   Comprehensive metabolic panel (Completed)   VITAMIN D 25 Hydroxy (Vit-D Deficiency, Fractures) (Completed)   Osteopenia of both hips    Chronic.  Previously on Repatha.  Medication appears to have been stopped.  Recommend continued use of calcium citrate and vitamin D for bone protection.  Can consider repeat DEXA scan as needed.      Relevant Orders   CBC with Differential/Platelet (Completed)   Comprehensive metabolic panel (Completed)   VITAMIN D 25 Hydroxy (Vit-D Deficiency, Fractures) (Completed)       Follow up plan: Return in about 1 year (around 05/13/2023) for CPE.  NEXT PREVENTATIVE PHYSICAL DUE IN 1 YEAR.  PATIENT COUNSELING PROVIDED FOR ALL ADULT PATIENTS: A well balanced diet low in saturated fats, cholesterol, and moderation in carbohydrates.  This can be as simple as monitoring portion sizes and cutting back on sugary beverages such as soda and juice to start with.    Daily water consumption of at least 64 ounces.  Physical activity at least 180 minutes per week.  If just starting out, start 10 minutes a day and work your way up.   This can be as simple as taking the stairs instead of the elevator and walking 2-3 laps around the office  purposefully every day.   STD protection, partner selection, and regular testing if high risk.  Limited consumption of alcoholic beverages if alcohol is consumed. For men, I recommend no more than 14 alcoholic beverages per week, spread out throughout the week (max 2 per day). Avoid "binge" drinking or consuming large quantities of alcohol in one  setting.  Please let me know if you feel you may need help with reduction or quitting alcohol consumption.   Avoidance of nicotine, if used. Please let me know if you feel you may need help with reduction or quitting nicotine use.   Daily mental health attention. This can be in the form of 5 minute daily meditation, prayer, journaling, yoga, reflection, etc.  Purposeful attention to your emotions and mental state can significantly improve your overall wellbeing  and  Health.  Please know that I am here to help you with all of your health care goals and am happy to work with you to find a solution that works best for you.  The greatest advice I have received with any changes in life are to take it one step at a time, that even means if all you can focus on is the next 60 seconds, then do that and celebrate your victories.  With any changes in life, you will have set backs, and  that is OK. The important thing to remember is, if you have a set back, it is not a failure, it is an opportunity to try again! Screening Testing Mammogram Every 1 -2 years based on history and risk factors Starting at age 31 Pap Smear Ages 21-39 every 3 years Ages 55-65 every 5 years with HPV testing More frequent testing may be required based on results and history Colon Cancer Screening Every 1-10 years based on test performed, risk factors, and history Starting at age 36 Bone Density Screening Every 2-10 years based on history Starting at age 512 for women Recommendations for men differ based on medication usage, history, and risk factors AAA Screening One time ultrasound Men 63-60 years old who have every smoked Lung Cancer Screening Low Dose Lung CT every 12 months Age 85-80 years with a 30 pack-year smoking history who still smoke or who have quit within the last 15 years   Screening Labs Routine  Labs: Complete Blood Count (CBC), Complete Metabolic Panel (CMP), Cholesterol (Lipid Panel) Every 6-12  months based on history and medications May be recommended more frequently based on current conditions or previous results Hemoglobin A1c Lab Every 3-12 months based on history and previous results Starting at age 61 or earlier with diagnosis of diabetes, high cholesterol, BMI >26, and/or risk factors Frequent monitoring for patients with diabetes to ensure blood sugar control Thyroid Panel (TSH) Every 6 months based on history, symptoms, and risk factors May be repeated more often if on medication HIV One time testing for all patients 47 and older May be repeated more frequently for patients with increased risk factors or exposure Hepatitis C One time testing for all patients 67 and older May be repeated more frequently for patients with increased risk factors or exposure Gonorrhea, Chlamydia Every 12 months for all sexually active persons 13-24 years Additional monitoring may be recommended for those who are considered high risk or who have symptoms Every 12 months for any woman on birth control, regardless of sexual activity PSA Men 14-79 years old with risk factors Additional screening may be recommended from age 64-69 based on risk factors, symptoms, and history  Vaccine Recommendations Tetanus Booster All adults every 10 years Flu Vaccine All patients 6 months and older every year COVID Vaccine All patients 12 years and older Initial dosing with booster May recommend additional booster based on age and health history HPV Vaccine 2 doses all patients age 51-26 Dosing may be considered for patients over 26 Shingles Vaccine (Shingrix) 2 doses all adults 55 years and older Pneumonia (Pneumovax 77) All adults 65 years and older May recommend earlier dosing based on health history One year apart from Prevnar 70 Pneumonia (Prevnar 2) All adults 65 years and older Dosed 1 year after Pneumovax 23 Pneumonia (Prevnar 20) One time alternative to the two dosing of 13 and 23 For  all adults with initial dose of 23, 20 is recommended 1 year later For all adults with initial dose of 13, 23 is still recommended as second option 1 year later

## 2022-05-14 LAB — COMPREHENSIVE METABOLIC PANEL
Albumin: 4.8 g/dL (ref 3.8–4.8)
BUN: 26 mg/dL (ref 8–27)
Bilirubin Total: 0.4 mg/dL (ref 0.0–1.2)
CO2: 24 mmol/L (ref 20–29)
Calcium: 10.3 mg/dL (ref 8.7–10.3)
Creatinine, Ser: 1.07 mg/dL — ABNORMAL HIGH (ref 0.57–1.00)
Globulin, Total: 2.6 g/dL (ref 1.5–4.5)
Glucose: 84 mg/dL (ref 70–99)
Potassium: 5 mmol/L (ref 3.5–5.2)
Sodium: 140 mmol/L (ref 134–144)
Total Protein: 7.4 g/dL (ref 6.0–8.5)
eGFR: 54 mL/min/{1.73_m2} — ABNORMAL LOW (ref >59)

## 2022-05-14 LAB — CBC WITH DIFFERENTIAL/PLATELET
Eos: 2 %
Hemoglobin: 13.9 g/dL (ref 11.1–15.9)
Immature Grans (Abs): 0 10*3/uL (ref 0.0–0.1)
Immature Granulocytes: 0 %
Lymphocytes Absolute: 2.3 10*3/uL (ref 0.7–3.1)
MCH: 29.1 pg (ref 26.6–33.0)
MCV: 87 fL (ref 79–97)
Neutrophils Absolute: 6.2 10*3/uL (ref 1.4–7.0)
Platelets: 335 10*3/uL (ref 150–450)
RBC: 4.78 x10E6/uL (ref 3.77–5.28)
RDW: 12.6 % (ref 11.7–15.4)

## 2022-05-14 LAB — VITAMIN D 25 HYDROXY (VIT D DEFICIENCY, FRACTURES): Vit D, 25-Hydroxy: 103 ng/mL — ABNORMAL HIGH (ref 30.0–100.0)

## 2022-05-26 ENCOUNTER — Encounter: Payer: Self-pay | Admitting: Nurse Practitioner

## 2022-05-26 DIAGNOSIS — M85851 Other specified disorders of bone density and structure, right thigh: Secondary | ICD-10-CM | POA: Insufficient documentation

## 2022-05-26 NOTE — Assessment & Plan Note (Signed)
History of chronic osteoarthritis currently managed with as needed meloxicam.  No alarm symptoms present at this time.  Will continue to monitor.

## 2022-05-26 NOTE — Assessment & Plan Note (Signed)
Chronic coronary artery disease in the setting of hypertension and hyperlipidemia.  Patient is currently managing her lipids with Zetia as she does have a statin intolerance with myalgias.  Blood pressures are well-controlled with lisinopril, Toprol all, and chlorthalidone.  She is currently on aspirin therapy daily.  No alarm symptoms are present at this time. Plan: - Continue current medication management - Continue with cardiology management.

## 2022-05-26 NOTE — Assessment & Plan Note (Signed)
CPE today. Labs pending. Will make changes as necessary based on results.  Review of HM activities and recommendations discussed and provided on AVS Anticipatory guidance, diet, and exercise recommendations provided.  Medications, allergies, and hx reviewed and updated as necessary.  Plan to f/u with CPE in 1 year or sooner for acute/chronic health needs as directed.   

## 2022-05-26 NOTE — Assessment & Plan Note (Signed)
Chronic with no reported symptoms at this time.  Labs reviewed with patient today.  Continue with diet and exercise management as well as lipid and blood pressure control.  Continue to follow with cardiology as needed.

## 2022-05-26 NOTE — Assessment & Plan Note (Signed)
Chronic hypertension in the setting of comorbidities of hyperlipidemia.  Currently symptoms are well-controlled.  Patient is currently on lisinopril, metoprolol, and chlorthalidone for management.  No alarm symptoms are present at this time.  Recommend continue current therapy.

## 2022-05-26 NOTE — Assessment & Plan Note (Signed)
Chronic dyslipidemia in the setting of coronary artery disease.  Patient is known to be intolerant of Lipitor, Crestor, and pravastatin all with daily and once weekly dosing.  At this time she is currently managed with Zetia.  Recommend strict dietary adherence for optimal control and close monitoring of labs.

## 2022-05-26 NOTE — Assessment & Plan Note (Signed)
Chronic.  Previously on Repatha.  Medication appears to have been stopped.  Recommend continued use of calcium citrate and vitamin D for bone protection.  Can consider repeat DEXA scan as needed.

## 2022-08-07 ENCOUNTER — Ambulatory Visit: Payer: Medicare Other | Attending: Internal Medicine | Admitting: Internal Medicine

## 2022-08-07 ENCOUNTER — Encounter: Payer: Self-pay | Admitting: Internal Medicine

## 2022-08-07 VITALS — HR 72 | Ht 62.0 in | Wt 150.0 lb

## 2022-08-07 DIAGNOSIS — I255 Ischemic cardiomyopathy: Secondary | ICD-10-CM

## 2022-08-07 DIAGNOSIS — I2510 Atherosclerotic heart disease of native coronary artery without angina pectoris: Secondary | ICD-10-CM

## 2022-08-07 DIAGNOSIS — I10 Essential (primary) hypertension: Secondary | ICD-10-CM

## 2022-08-07 DIAGNOSIS — E782 Mixed hyperlipidemia: Secondary | ICD-10-CM

## 2022-08-07 DIAGNOSIS — M791 Myalgia, unspecified site: Secondary | ICD-10-CM | POA: Diagnosis not present

## 2022-08-07 DIAGNOSIS — E785 Hyperlipidemia, unspecified: Secondary | ICD-10-CM

## 2022-08-07 NOTE — Patient Instructions (Signed)
Medication Instructions:  NO CHANGES -- will depend on lab results  *If you need a refill on your cardiac medications before your next appointment, please call your pharmacy*   Lab Work: FASTING NMR lipoprofile and LPa as soon as able at any [employer] locations:  [facility] - [address] (Dr. Blanchie Dessert office) - [address] ([facility]) - [phone] [address]  [place] [facility] [address]   [place]  - [address]   [place] - [address] - [address] [employer]  - [address] [place] - [address] Chief Technology Officer)  If you have labs (blood work) drawn today and your tests are completely normal, you will receive your results only by: [employer] (if you have MyChart) OR A paper copy in the mail If you have any lab test that is abnormal or we need to change your treatment, we will call you to review the results.   Follow-Up: At [facility], you and your health needs are our priority.  As part of our continuing mission to provide you with exceptional heart care, we have created designated Provider Care Teams.  These Care Teams include your primary Cardiologist (physician) and Advanced Practice Providers (APPs -  Physician Assistants and Nurse Practitioners) who all work together to provide you with the care you need, when you need it.  We recommend signing up for the patient portal called "MyChart".  Sign up information is provided on this After Visit Summary.  MyChart is used to connect with patients for Virtual Visits (Telemedicine).  Patients are able to view lab/test results, encounter notes, upcoming appointments, etc.  Non-urgent messages can be sent to your provider as well.   To learn more about what you can do with MyChart, go to [email or web address].    Your next appointment:    4  months with Dr. Rennis Golden if med changes are made

## 2022-08-07 NOTE — Progress Notes (Signed)
Virtual Visit via Video Note   Because of Susan Welch's co-morbid illnesses, she is at least at moderate risk for complications without adequate follow up.  This format is felt to be most appropriate for this patient at this time.  All issues noted in this document were discussed and addressed.  A limited physical exam was performed with this format.  Please refer to the patient's chart for her consent to telehealth for [facility].      Date:  08/07/2022   ID:  Susan Welch, DOB 08-22-1945, MRN [id number] The patient was identified using 2 identifiers.  Evaluation Performed:  New Patient Evaluation  Patient Location:  [address] [place] [address]  Provider location:   [address] [place], [place] [address]  PCP:  Susan Cookey Sung Amabile, NP  Cardiologist:  Rollene Rotunda, MD Electrophysiologist:  None   Chief Complaint: Manage dyslipidemia  History of Present Illness:    Susan Welch is a 77 y.o. female who presents via audio/video conferencing for a telehealth visit today.  This is a pleasant 77 year old female kindly referred for evaluation management of dyslipidemia.  She has a history of coronary artery disease status post CABG in 2014.  She also has coronary risk factors including dyslipidemia hypertension, and ischemic cardiomyopathy with EF as low as 40% in the past, and history of ventricular fibrillation.  She is unfortunately been statin intolerant.  Subsequently she had been managed on Praluent however the cost of the medication and availability issues led to a switch to Repatha.  She has done well on this.  She is also been on ezetimibe.  LDL in September 2022 was the lowest that I could find in the past 10 years with total 154 and LDL 70.  Her LDL then come up to 86 as of February 2024 with total cholesterol 179 and triglycerides 165.  She was referred for evaluation and management of primarily her triglycerides per her  report.  Since her labs in February, however she has started a new part-time job which she is more active at and works about 4 hours a day.  She is also started more physical activity and cut meats out of her diet.  She feels that perhaps her cholesterol has improved further.  Prior CV studies:   The following studies were reviewed today:  Chart reviewed, lab work  PMHx:  Past Medical History:  Diagnosis Date   Anemia    Anxiety    Hx: of situational anxiety   Arthritis    a. bilat knees s/p R TKA 07/15/2012   Carotid stenosis    Pre-CABG Dopplers 07/19/12: Right 40-59%.   Coronary artery disease    a. s/p VF arrest during TKR 7/14 2/2 NSTEMI => LHC with 3v CAD =>  s/p CABG 07/21/12 (LIMA-LAD, SVG-RI and OM1, SVG-distal RCA).    Coronary artery disease involving native coronary artery of native heart without angina pectoris 06/17/2016   DJD (degenerative joint disease) of knee 09/21/2014   DVT (deep venous thrombosis) (HCC) 07/2012   Encounter to establish care 05/03/2020   GERD (gastroesophageal reflux disease)    Headache(784.0)    Hx: of migraines   HTN (hypertension) 09/20/2011   Hyperlipidemia    Hypertension    Ischemic cardiomyopathy    Echo 07/19/12: EF 40-45%, basal to mid anteroseptal AK, distal anteroseptal HK, mild MR.    Knee joint replacement by other means 07/26/2012   Lack of interest 05/03/2020   Medication  management 06/17/2016   Osteoarthritis of right knee 07/16/2012   Panic attacks    Piriformis syndrome of right side 05/03/2020   Pneumonia    Serum potassium elevated 12/14/2015   Ventricular fibrillation (HCC)    a. 07/15/2012 in setting of R TKA   Wears glasses     Past Surgical History:  Procedure Laterality Date   ABDOMINAL HYSTERECTOMY     CORONARY ARTERY BYPASS GRAFT N/A 07/21/2012   Procedure: CORONARY ARTERY BYPASS GRAFTING (CABG);  Surgeon: Delight Ovens, MD;  Location[facility] OR;  Service: Open Heart Surgery;  Laterality: N/A;  Times 4 using left  internal mammary artery and endoscopically harvested left saphenous vein.   DILATION AND CURETTAGE OF UTERUS     INTRAOPERATIVE TRANSESOPHAGEAL ECHOCARDIOGRAM N/A 07/21/2012   Procedure: INTRAOPERATIVE TRANSESOPHAGEAL ECHOCARDIOGRAM;  Surgeon: Delight Ovens, MD;  Location: [facility] OR;  Service: Open Heart Surgery;  Laterality: N/A;   LEFT HEART CATHETERIZATION WITH CORONARY ANGIOGRAM N/A 07/17/2012   Procedure: LEFT HEART CATHETERIZATION WITH CORONARY ANGIOGRAM;  Surgeon: Rollene Rotunda, MD;  Location: [facility] CATH LAB;  Service: Cardiovascular;  Laterality: N/A;   TOTAL KNEE ARTHROPLASTY Right 07/15/2012   Procedure: TOTAL KNEE ARTHROPLASTY;  Surgeon: Loreta Ave, MD;  Location: [facility] OR;  Service: Orthopedics;  Laterality: Right;  drapes pulled back to provide cpr, new drape applied, protocol followed   TOTAL KNEE ARTHROPLASTY Left 09/21/2014   Procedure: TOTAL LEFT KNEE ARTHROPLASTY;  Surgeon: Loreta Ave, MD;  Location: [facility] OR;  Service: Orthopedics;  Laterality: Left;    FAMHx:  Family History  Problem Relation Age of Onset   Thyroid disease Mother        has thyroid removed   Colitis Mother    COPD Sister     SOCHx:   reports that she quit smoking about 22 years ago. Her smoking use included cigarettes. She has never used smokeless tobacco. She reports current alcohol use. She reports that she does not use drugs.  ALLERGIES:  Allergies  Allergen Reactions   Oxycodone Other (See Comments)    Flu like symptoms.   Crestor [Rosuvastatin]     myalgias   Latex    Pravastatin     myaglias   Statins Other (See Comments)    Joint pain with Lipitor daily, Pravastatin 10 mg qd and Pravastatin 10 mg M/W/F, Crestor 10 mg once weekly also caused joint aches   Nickel Rash    MEDS:  Current Meds  Medication Sig   aspirin 81 MG tablet Take 81 mg by mouth daily.   calcium citrate-vitamin D (CITRACAL+D) 315-200 MG-UNIT per tablet Take 1 tablet by mouth daily.   cetirizine (ZYRTEC) 10 MG  tablet Take 10 mg by mouth daily.   chlorthalidone (HYGROTON) 25 MG tablet TAKE 1 TABLET(25 MG) BY MOUTH DAILY   Cholecalciferol (VITAMIN D) 2000 UNITS tablet Take 1 tablet (2,000 Units total) by mouth daily.   Coenzyme Q10 (CO Q10) 200 MG CAPS Take 200 mg by mouth daily.   Evolocumab (REPATHA) 140 MG/ML SOSY Inject 140 mg into the skin every 14 (fourteen) days.   ezetimibe (ZETIA) 10 MG tablet TAKE 1 TABLET(10 MG) BY MOUTH DAILY   folic acid (FOLVITE) 400 MCG tablet Take by mouth daily.    lisinopril (ZESTRIL) 10 MG tablet Take 1 tablet (10 mg total) by mouth 2 (two) times daily.   loratadine (CLARITIN) 10 MG tablet Take 10 mg by mouth daily.   meloxicam (MOBIC) 15 MG tablet Take 1 tablet (  15 mg total) by mouth daily as needed for pain.   metoprolol tartrate (LOPRESSOR) 25 MG tablet Take 1 tablet (25 mg total) by mouth 2 (two) times daily.   mometasone (ELOCON) 0.1 % cream Apply small amount to skin lesions once a day as needed.   omeprazole (PRILOSEC) 10 MG capsule Take 10 mg by mouth daily.     ROS: Pertinent items noted in HPI and remainder of comprehensive ROS otherwise negative.  Labs/Other Tests and Data Reviewed:    Recent Labs: 05/13/2022: ALT 18; BUN 26; Creatinine, Ser 1.07; Hemoglobin 13.9; Platelets 335; Potassium 5.0; Sodium 140   Recent Lipid Panel Lab Results  Component Value Date/Time   CHOL 179 03/01/2022 09:53 AM   TRIG 165 (H) 03/01/2022 09:53 AM   HDL 65 03/01/2022 09:53 AM   CHOLHDL 2.8 03/01/2022 09:53 AM   CHOLHDL 4 05/18/2013 08:44 AM   LDLCALC 86 03/01/2022 09:53 AM   LDLDIRECT 174.9 02/08/2013 09:14 AM    Wt Readings from Last 3 Encounters:  08/07/22 150 lb (68 kg)  05/13/22 158 lb (71.7 kg)  04/23/22 155 lb (70.3 kg)     Exam:    Vital Signs:  Pulse 72   Ht 5\' 2"  (1.575 m)   Wt 150 lb (68 kg)   BMI 27.44 kg/m    General appearance: alert and no distress Lungs: No visual respiratory difficulty Abdomen: Mildly overweight Extremities:  extremities normal, atraumatic, no cyanosis or edema Neurologic: Grossly normal  ASSESSMENT & PLAN:    Mixed dyslipidemia, goal LDL less than 70 Statin intolerance-myalgias CAD status post CABG in 2014 History of VF Hypertension Ischemic cardiomyopathy (LVEF 40% -> improved to 50-55% in 2016)  Mrs. Daigneault has a mixed dyslipidemia with target LDL less than 70.  She has had some minimally elevated triglycerides in the past but at other times have been normal.  She recently had been less active however she started a new part-time job, is exercising more and has changed her diet.  Will plan to reassess her triglycerides as they may be normal at this point.  Will get a lipid NMR and an LP(a).  Continue Repatha and ezetimibe.  Options if her triglycerides are elevated could include adding Vascepa which is associated with cardiovascular risk reduction or possibly bempedoic acid if the LDL and triglycerides are above target.  Thanks again for the kind referral.  Patient Risk:   After full review of this patients clinical status, I feel that they are at least moderate risk at this time.  Time:   Today, I have spent 25 minutes with the patient with telehealth technology discussing dyslipidemia.     Medication Adjustments/Labs and Tests Ordered: Current medicines are reviewed at length with the patient today.  Concerns regarding medicines are outlined above.   Tests Ordered: Orders Placed This Encounter  Procedures   NMR, lipoprofile   Lipoprotein A (LPA)    Medication Changes: No orders of the defined types were placed in this encounter.   Disposition:  in 4 month(s)  Chrystie Nose, MD, [facility], FACP  [place]  [facility]  Medical Director of the Advanced Lipid Disorders &  Cardiovascular Risk Reduction Clinic Diplomate of the American Board of Clinical Lipidology Attending Cardiologist  Direct Dial: [phone]  Fax: [phone]  Website:   [email or web address]  Chrystie Nose, MD  08/07/2022 9:10 AM

## 2022-08-12 DIAGNOSIS — E785 Hyperlipidemia, unspecified: Secondary | ICD-10-CM | POA: Diagnosis not present

## 2022-09-22 ENCOUNTER — Other Ambulatory Visit (HOSPITAL_BASED_OUTPATIENT_CLINIC_OR_DEPARTMENT_OTHER): Payer: Self-pay | Admitting: Nurse Practitioner

## 2022-09-22 DIAGNOSIS — E789 Disorder of lipoprotein metabolism, unspecified: Secondary | ICD-10-CM

## 2022-09-30 ENCOUNTER — Other Ambulatory Visit: Payer: Self-pay | Admitting: Cardiology

## 2022-11-06 NOTE — Progress Notes (Signed)
  Cardiology Office Note:   Date:  11/08/2022  ID:  Susan Welch, DOB 1945-09-17, MRN [id number] PCP: Tollie Eth, NP  [facility] Providers Cardiologist:  Rollene Rotunda, MD {  History of Present Illness:   Susan Welch is a 77 y.o. female who presents for followup after bypass. She was admitted for total knee replacement and had a V. Fib arrest and was found to have three-vessel disease.  She had a mildly reduced ejection fraction.  Since I last saw her she has had no new cardiovascular complaints.  She is limited because of foot pain and she might have to have some procedure done.  She does try to do some activities such as walking her dog and does not like to walk. The patient denies any new symptoms such as chest discomfort, neck or arm discomfort. There has been no new shortness of breath, PND or orthopnea. There have been no reported palpitations, presyncope or syncope.  At the last visit she could not afford Praluent.  I switched her back to Repatha.   She is gotten a job.  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  She is on her feet a lot.  She is also changed her diet almost be vegetarian. The patient denies any new symptoms such as chest discomfort, neck or arm discomfort. There has been no new shortness of breath, PND or orthopnea. There have been no reported palpitations, presyncope or syncope.   ROS: As stated in the HPI and negative for all other systems.  Studies Reviewed:    EKG:   EKG Interpretation Date/Time:  Friday November 08 2022 15:57:10 EDT Ventricular Rate:  62 PR Interval:  170 QRS Duration:  84 QT Interval:  404 QTC Calculation: 410 R Axis:   63  Text Interpretation: Normal sinus rhythm Low voltage QRS When compared with ECG of 22-Jul-2012 07:09, Vent. rate has decreased BY  39 BPM Confirmed by Rollene Rotunda ([id number]) on 11/08/2022 4:11:10 PM     Risk Assessment/Calculations:              Physical Exam:   VS:   BP 124/84   Pulse 62   Ht 5\' 2"  (1.575 m)   Wt 146 lb 3.2 oz (66.3 kg)   SpO2 91%   BMI 26.74 kg/m    Wt Readings from Last 3 Encounters:  11/08/22 146 lb 3.2 oz (66.3 kg)  08/07/22 150 lb (68 kg)  05/13/22 158 lb (71.7 kg)     GEN: Well nourished, well developed in no acute distress NECK: No JVD; No carotid bruits CARDIAC: RRR, no murmurs, rubs, gallops RESPIRATORY:  Clear to auscultation without rales, wheezing or rhonchi  ABDOMEN: Soft, non-tender, non-distended EXTREMITIES:  No edema; No deformity   ASSESSMENT AND PLAN:   CAD:  The patient has no new sypmtoms.  No further cardiovascular testing is indicated.  We will continue with aggressive risk reduction and meds as listed.  HTN:   His blood pressure is at target.  No change in therapy.  DYSLIPIDEMIA:   LDL was much better at 66.   Unfortunately she was not approved by her insurance company swelling in terms of her to our Pharm.D.'s to see if they can help.      Follow up with me in 12 months.   Signed, Rollene Rotunda, MD

## 2022-11-08 ENCOUNTER — Telehealth: Payer: Self-pay | Admitting: Pharmacist

## 2022-11-08 ENCOUNTER — Encounter: Payer: Self-pay | Admitting: Cardiology

## 2022-11-08 ENCOUNTER — Ambulatory Visit: Payer: Medicare Other | Attending: Cardiology | Admitting: Cardiology

## 2022-11-08 VITALS — BP 124/84 | HR 62 | Ht 62.0 in | Wt 146.2 lb

## 2022-11-08 DIAGNOSIS — E785 Hyperlipidemia, unspecified: Secondary | ICD-10-CM

## 2022-11-08 DIAGNOSIS — I10 Essential (primary) hypertension: Secondary | ICD-10-CM

## 2022-11-08 DIAGNOSIS — I2510 Atherosclerotic heart disease of native coronary artery without angina pectoris: Secondary | ICD-10-CM

## 2022-11-08 NOTE — Patient Instructions (Signed)
Medication Instructions:  Consulted with Pharm D in room for information regarding Repatha.    Follow-Up: At [facility], you and your health needs are our priority.  As part of our continuing mission to provide you with exceptional heart care, we have created designated Provider Care Teams.  These Care Teams include your primary Cardiologist (physician) and Advanced Practice Providers (APPs -  Physician Assistants and Nurse Practitioners) who all work together to provide you with the care you need, when you need it.  We recommend signing up for the patient portal called "MyChart".  Sign up information is provided on this After Visit Summary.  MyChart is used to connect with patients for Virtual Visits (Telemedicine).  Patients are able to view lab/test results, encounter notes, upcoming appointments, etc.  Non-urgent messages can be sent to your provider as well.   To learn more about what you can do with MyChart, go to [email or web address].    Your next appointment:   12 month(s)  Provider:   Rollene Rotunda, MD

## 2022-11-09 ENCOUNTER — Other Ambulatory Visit: Payer: Self-pay | Admitting: Podiatry

## 2022-11-11 ENCOUNTER — Other Ambulatory Visit (HOSPITAL_COMMUNITY): Payer: Self-pay

## 2022-11-11 ENCOUNTER — Telehealth: Payer: Self-pay | Admitting: Pharmacy Technician

## 2022-11-11 DIAGNOSIS — E785 Hyperlipidemia, unspecified: Secondary | ICD-10-CM

## 2022-11-11 DIAGNOSIS — I2510 Atherosclerotic heart disease of native coronary artery without angina pectoris: Secondary | ICD-10-CM

## 2022-11-11 NOTE — Telephone Encounter (Signed)
Pharmacy Patient Advocate Encounter   Received notification from Pt Calls Messages that prior authorization for repatha is required/requested.   Insurance verification completed.   The patient is insured through [facility] .   Per test claim: PA required; PA submitted to above mentioned insurance via CoverMyMeds Key/confirmation #/EOC [id number] Status is pending

## 2022-11-11 NOTE — Telephone Encounter (Signed)
Pharmacy Patient Advocate Encounter  Received notification from [facility] that Prior Authorization for repatha has been APPROVED from 11/11/22 to 11/11/23. Ran test claim, Copay is $133.16- one month. This test claim was processed through [facility]- copay amounts may vary at other pharmacies due to pharmacy/plan contracts, or as the patient moves through the different stages of their insurance plan.   PA #/Case ID/Reference #: [id number]

## 2022-11-12 MED ORDER — REPATHA SURECLICK 140 MG/ML ~~LOC~~ SOAJ: 1.0000 mL | SUBCUTANEOUS | 5 refills | Status: DC

## 2022-11-12 NOTE — Addendum Note (Signed)
Addended by: Cheree Ditto on: 11/12/2022 09:43 AM   Modules accepted: Orders

## 2022-11-27 NOTE — Telephone Encounter (Signed)
PA request

## 2023-01-10 ENCOUNTER — Telehealth: Payer: Self-pay | Admitting: Cardiology

## 2023-01-10 ENCOUNTER — Other Ambulatory Visit: Payer: Self-pay | Admitting: Cardiology

## 2023-01-10 DIAGNOSIS — E789 Disorder of lipoprotein metabolism, unspecified: Secondary | ICD-10-CM

## 2023-01-10 MED ORDER — EZETIMIBE 10 MG PO TABS: 10.0000 mg | ORAL_TABLET | Freq: Every day | ORAL | 1 refills | Status: DC

## 2023-01-10 NOTE — Telephone Encounter (Signed)
*  STAT* If patient is at the pharmacy, call can be transferred to refill team.   1. Which medications need to be refilled? (please list name of each medication and dose if known)    ezetimibe (ZETIA) 10 MG tablet    2. Which pharmacy/location (including street and city if local pharmacy) is medication to be sent to?  [id number] - [place], [place] - [address] AT [facility] OF [address]      3. Do they need a 30 day or 90 day supply? 90 day    Pt is out of medication

## 2023-01-10 NOTE — Telephone Encounter (Signed)
 Pt's medication was sent to pt's pharmacy as requested. Confirmation received.

## 2023-02-08 ENCOUNTER — Other Ambulatory Visit: Payer: Self-pay | Admitting: Nurse Practitioner

## 2023-02-08 DIAGNOSIS — I10 Essential (primary) hypertension: Secondary | ICD-10-CM

## 2023-03-29 ENCOUNTER — Other Ambulatory Visit: Payer: Self-pay | Admitting: Cardiology

## 2023-03-29 DIAGNOSIS — I2510 Atherosclerotic heart disease of native coronary artery without angina pectoris: Secondary | ICD-10-CM

## 2023-03-29 DIAGNOSIS — E785 Hyperlipidemia, unspecified: Secondary | ICD-10-CM

## 2023-04-22 ENCOUNTER — Telehealth: Payer: Self-pay | Admitting: Pharmacy Technician

## 2023-04-22 ENCOUNTER — Other Ambulatory Visit (HOSPITAL_COMMUNITY): Payer: Self-pay

## 2023-04-22 ENCOUNTER — Telehealth: Payer: Self-pay | Admitting: Cardiology

## 2023-04-22 NOTE — Telephone Encounter (Signed)
 I called the pharmacy and it just had to be ordered. I ran a test claim and the copay came back 45.00. I made sure they had a valid rx as well. I called the patient and made her aware.

## 2023-04-22 NOTE — Telephone Encounter (Signed)
 Message has been sent to pharmacy team to refill

## 2023-04-22 NOTE — Telephone Encounter (Signed)
 Pt c/o medication issue:  1. Name of Medication: REPATHA SURECLICK 140 MG/ML SOAJ   2. How are you currently taking this medication (dosage and times per day)? As written  3. Are you having a reaction (difficulty breathing--STAT)? No  4. What is your medication issue? Pt requesting prior auth for this medication

## 2023-04-29 ENCOUNTER — Ambulatory Visit: Payer: Medicare Other

## 2023-04-29 DIAGNOSIS — Z0000 Encounter for general adult medical examination without abnormal findings: Secondary | ICD-10-CM

## 2023-04-29 NOTE — Progress Notes (Signed)
 Subjective:   Susan Welch is a 78 y.o. who presents for a Medicare Wellness preventive visit.  Visit Complete: Virtual I connected with  Susan Welch on 04/29/23 by a audio enabled telemedicine application and verified that I am speaking with the correct person using two identifiers.  Patient Location: Home  Provider Location: Office/Clinic  I discussed the limitations of evaluation and management by telemedicine. The patient expressed understanding and agreed to proceed.  Vital Signs: Because this visit was a virtual/telehealth visit, some criteria may be missing or patient reported. Any vitals not documented were not able to be obtained and vitals that have been documented are patient reported.  VideoError- Librarian, academic were attempted between this provider and patient, however failed, due to patient having technical difficulties OR patient did not have access to video capability.  We continued and completed visit with audio only.   Persons Participating in Visit: Patient.  AWV Questionnaire: No: Patient Medicare AWV questionnaire was not completed prior to this visit.  Cardiac Risk Factors include: advanced age (>22men, >20 women);dyslipidemia;hypertension     Objective:    Today's Vitals   There is no height or weight on file to calculate BMI.     04/29/2023   10:55 AM 04/23/2022   11:03 AM 08/03/2020   11:18 AM 09/08/2014   10:29 AM 07/24/2012   11:38 PM 07/20/2012   11:00 PM 07/16/2012   11:00 AM  Advanced Directives  Does Patient Have a Medical Advance Directive? Yes Yes Yes No Patient has advance directive, copy not in chart Patient does not have advance directive Patient does not have advance directive  Type of Advance Directive Healthcare Power of [place];Living will Healthcare Power of [place];Living will Healthcare Power of Attorney  Living will    Does patient want to make changes to medical advance directive?   No - Patient  declined      Copy of Healthcare Power of Attorney in Chart? No - copy requested No - copy requested No - copy requested  Copy requested from family    Would patient like information on creating a medical advance directive?    Yes - Educational materials given  Advance directive packet given Advance directive packet given  Pre-existing out of facility DNR order (yellow form or pink MOST form)     No No No    Current Medications (verified) Outpatient Encounter Medications as of 04/29/2023  Medication Sig   aspirin  81 MG tablet Take 81 mg by mouth daily.   calcium  citrate-vitamin D  (CITRACAL+D) 315-200 MG-UNIT per tablet Take 1 tablet by mouth daily.   cetirizine (ZYRTEC) 10 MG tablet Take 10 mg by mouth daily.   chlorthalidone  (HYGROTON ) 25 MG tablet TAKE 1 TABLET(25 MG) BY MOUTH DAILY   Cholecalciferol (VITAMIN D ) 2000 UNITS tablet Take 1 tablet (2,000 Units total) by mouth daily.   Coenzyme Q10 (CO Q10) 200 MG CAPS Take 200 mg by mouth daily.   ezetimibe  (ZETIA ) 10 MG tablet Take 1 tablet (10 mg total) by mouth daily.   folic acid  (FOLVITE ) 400 MCG tablet Take by mouth daily.    lisinopril  (ZESTRIL ) 10 MG tablet Take 1 tablet (10 mg total) by mouth 2 (two) times daily.   meloxicam  (MOBIC ) 15 MG tablet TAKE 1 TABLET(15 MG) BY MOUTH DAILY AS NEEDED FOR PAIN   metoprolol  tartrate (LOPRESSOR ) 25 MG tablet Take 1 tablet (25 mg total) by mouth 2 (two) times daily.   mometasone  (ELOCON ) 0.1 % cream Apply  small amount to skin lesions once a day as needed.   omeprazole (PRILOSEC) 10 MG capsule Take 10 mg by mouth daily.   REPATHA  SURECLICK 140 MG/ML SOAJ ADMINISTER 1 ML UNDER THE SKIN EVERY 14 DAYS   loratadine (CLARITIN) 10 MG tablet Take 10 mg by mouth daily. (Patient not taking: Reported on 04/29/2023)   No facility-administered encounter medications on file as of 04/29/2023.    Allergies (verified) Oxycodone , Crestor  [rosuvastatin ], Latex, Pravastatin , Statins, and Nickel   History: Past  Medical History:  Diagnosis Date   Anemia    Anxiety    Hx: of situational anxiety   Arthritis    a. bilat knees s/p R TKA 07/15/2012   Carotid stenosis    Pre-CABG Dopplers 07/19/12: Right 40-59%.   Coronary artery disease    a. s/p VF arrest during TKR 7/14 2/2 NSTEMI => LHC with 3v CAD =>  s/p CABG 07/21/12 (LIMA-LAD, SVG-RI and OM1, SVG-distal RCA).    Coronary artery disease involving native coronary artery of native heart without angina pectoris 06/17/2016   DJD (degenerative joint disease) of knee 09/21/2014   DVT (deep venous thrombosis) (HCC) 07/2012   Encounter to establish care 05/03/2020   GERD (gastroesophageal reflux disease)    Headache(784.0)    Hx: of migraines   HTN (hypertension) 09/20/2011   Hyperlipidemia    Hypertension    Ischemic cardiomyopathy    Echo 07/19/12: EF 40-45%, basal to mid anteroseptal AK, distal anteroseptal HK, mild MR.    Knee joint replacement by other means 07/26/2012   Lack of interest 05/03/2020   Medication management 06/17/2016   Osteoarthritis of right knee 07/16/2012   Panic attacks    Piriformis syndrome of right side 05/03/2020   Pneumonia    Serum potassium elevated 12/14/2015   Ventricular fibrillation (HCC)    a. 07/15/2012 in setting of R TKA   Wears glasses    Past Surgical History:  Procedure Laterality Date   ABDOMINAL HYSTERECTOMY     CORONARY ARTERY BYPASS GRAFT N/A 07/21/2012   Procedure: CORONARY ARTERY BYPASS GRAFTING (CABG);  Surgeon: Norita Beauvais, MD;  Location: [facility];  Service: Open Heart Surgery;  Laterality: N/A;  Times 4 using left internal mammary artery and endoscopically harvested left saphenous vein.   DILATION AND CURETTAGE OF UTERUS     INTRAOPERATIVE TRANSESOPHAGEAL ECHOCARDIOGRAM N/A 07/21/2012   Procedure: INTRAOPERATIVE TRANSESOPHAGEAL ECHOCARDIOGRAM;  Surgeon: Norita Beauvais, MD;  Location: [facility];  Service: Open Heart Surgery;  Laterality: N/A;   LEFT HEART CATHETERIZATION WITH CORONARY ANGIOGRAM  N/A 07/17/2012   Procedure: LEFT HEART CATHETERIZATION WITH CORONARY ANGIOGRAM;  Surgeon: Eilleen Grates, MD;  Location: [facility] CATH LAB;  Service: Cardiovascular;  Laterality: N/A;   TOTAL KNEE ARTHROPLASTY Right 07/15/2012   Procedure: TOTAL KNEE ARTHROPLASTY;  Surgeon: Ferd Householder, MD;  Location: [facility] OR;  Service: Orthopedics;  Laterality: Right;  drapes pulled back to provide cpr, new drape applied, protocol followed   TOTAL KNEE ARTHROPLASTY Left 09/21/2014   Procedure: TOTAL LEFT KNEE ARTHROPLASTY;  Surgeon: Ferd Householder, MD;  Location: [facility] OR;  Service: Orthopedics;  Laterality: Left;   Family History  Problem Relation Age of Onset   Thyroid  disease Mother        has thyroid  removed   Colitis Mother    COPD Sister    Social History   Socioeconomic History   Marital status: Widowed    Spouse name: Not on file   Number of children: Not on file  Years of education: Not on file   Highest education level: Not on file  Occupational History   Occupation: Advertising account executive    Employer: [employer]  Tobacco Use   Smoking status: Former    Current packs/day: 0.00    Types: Cigarettes    Quit date: 01/08/2000    Years since quitting: 23.3   Smokeless tobacco: Never  Vaping Use   Vaping status: Never Used  Substance and Sexual Activity   Alcohol use: Not Currently   Drug use: No   Sexual activity: Not Currently    Birth control/protection: Post-menopausal  Other Topics Concern   Not on file  Social History Narrative   Marital status: Divorced and widowed.      Children: 2 children; 6 grandchildren.      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       Alcohol: none       Exercise: every other day; waling 2 miles; OA limiting activity.      ADLs: independent with ADLs.   Social Drivers of Corporate investment banker Strain: Low Risk  (04/29/2023)   Overall Financial Resource  Strain (CARDIA)    Difficulty of Paying Living Expenses: Not hard at all  Food Insecurity: No Food Insecurity (04/29/2023)   Hunger Vital Sign    Worried About Running Out of Food in the Last Year: Never true    Ran Out of Food in the Last Year: Never true  Transportation Needs: No Transportation Needs (04/29/2023)   PRAPARE - Administrator, Civil Service (Medical): No    Lack of Transportation (Non-Medical): No  Physical Activity: Sufficiently Active (04/29/2023)   Exercise Vital Sign    Days of Exercise per Week: 7 days    Minutes of Exercise per Session: 30 min  Stress: No Stress Concern Present (04/29/2023)   [employer] of Occupational Health - Occupational Stress Questionnaire    Feeling of Stress : Not at all  Social Connections: Moderately Integrated (04/29/2023)   Social Connection and Isolation Panel [NHANES]    Frequency of Communication with Friends and Family: More than three times a week    Frequency of Social Gatherings with Friends and Family: Once a week    Attends Religious Services: More than 4 times per year    Active Member of [employer] or Organizations: Yes    Attends Engineer, structural: More than 4 times per year    Marital Status: Divorced    Tobacco Counseling Counseling given: Not Answered    Clinical Intake:  Pre-visit preparation completed: Yes  Pain : No/denies pain     Nutritional Risks: None Diabetes: No  Lab Results  Component Value Date   HGBA1C 5.8 (H) 07/20/2012   HGBA1C 5.6 07/15/2012     How often do you need to have someone help you when you read instructions, pamphlets, or other written materials from your doctor or pharmacy?: 1 - Never  Interpreter Needed?: No  Information entered by :: NAllen LPN   Activities of Daily Living     04/29/2023   10:46 AM  In your present state of health, do you have any difficulty performing the following activities:  Hearing? 0  Vision? 0  Difficulty concentrating  or making decisions? 0  Walking or climbing stairs? 0  Dressing or bathing? 0  Doing  errands, shopping? 0  Preparing Food and eating ? N  Using the Toilet? N  In the past six months, have you accidently leaked urine? N  Do you have problems with loss of bowel control? N  Managing your Medications? N  Managing your Finances? N  Housekeeping or managing your Housekeeping? N    Patient Care Team: Early, Adriane Albe, NP as PCP - General (Nurse Practitioner) Eilleen Grates, MD as PCP - Cardiology (Cardiology) Elvin Hammer, MD as Attending Physician (Family Medicine) Ferd Householder, MD (Inactive) as Consulting Physician (Orthopedic Surgery) Eilleen Grates, MD as Consulting Physician (Cardiology) Kandy Orris, MD (Emergency Medicine)  Indicate any recent Medical Services you may have received from other than [facility] providers in the past year (date may be approximate).     Assessment:   This is a routine wellness examination for [employer].  Hearing/Vision screen Hearing Screening - Comments:: Denies hearing issues Vision Screening - Comments:: regular eye exams, Dr. Augustus Ledger, [facility]   Goals Addressed             This Visit's Progress    Patient Stated       04/29/2023, lose 7 pounds before she sees Sar Beth       Depression Screen     04/29/2023   10:57 AM 05/13/2022    3:04 PM 04/23/2022   11:04 AM 08/03/2020   11:20 AM 05/02/2020    2:49 PM 05/15/2016    8:38 AM 08/01/2014    5:37 PM  PHQ 2/9 Scores  PHQ - 2 Score 0 0 0 0 2 0 0  PHQ- 9 Score 1  1  4       Fall Risk     04/29/2023   10:57 AM 05/13/2022    3:04 PM 04/23/2022   11:04 AM 08/03/2020   11:19 AM 05/02/2020    2:49 PM  Fall Risk   Falls in the past year? 0 0 0 0 1  Number falls in past yr: 0 0 0 0 0  Injury with Fall? 0 0 0 0   Risk for fall due to : Medication side effect No Fall Risks Medication side effect No Fall Risks No Fall Risks  Follow up Falls prevention discussed;Falls evaluation completed Falls  evaluation completed Falls prevention discussed;Education provided;Falls evaluation completed Falls evaluation completed;Falls prevention discussed     MEDICARE RISK AT HOME:  Medicare Risk at Home Any stairs in or around the home?: No If so, are there any without handrails?: No Home free of loose throw rugs in walkways, pet beds, electrical cords, etc?: Yes Adequate lighting in your home to reduce risk of falls?: Yes Life alert?: No Use of a cane, walker or w/c?: No Grab bars in the bathroom?: Yes Shower chair or bench in shower?: No Elevated toilet seat or a handicapped toilet?: No  TIMED UP AND GO:  Was the test performed?  No  Cognitive Function: 6CIT completed        04/29/2023   10:58 AM 04/23/2022   11:07 AM 08/03/2020   11:23 AM  6CIT Screen  What Year? 0 points 0 points 0 points  What month? 0 points 0 points 0 points  What time? 0 points 0 points 0 points  Count back from 20 0 points 0 points 0 points  Months in reverse 0 points 0 points 0 points  Repeat phrase 0 points 2 points 0 points  Total Score 0 points 2 points 0 points    Immunizations  Immunization History  Administered Date(s) Administered   Influenza,inj,Quad PF,6+ Mos 10/29/2012, 12/21/2013   Influenza-Unspecified 12/18/2011   PFIZER(Purple Top)SARS-COV-2 Vaccination 01/25/2019, 02/15/2019, 11/15/2019   Pfizer Covid-19 Vaccine Bivalent Booster 68yrs & up 10/07/2021   Pneumococcal Conjugate-13 08/01/2014   Pneumococcal Polysaccharide-23 04/03/2012   Tdap 01/07/2014   Varicella 01/08/1999   Zoster Recombinant(Shingrix) 10/04/2020, 07/16/2021    Screening Tests Health Maintenance  Topic Date Due   COVID-19 Vaccine (5 - 2024-25 season) 09/08/2022   INFLUENZA VACCINE  08/08/2023   DTaP/Tdap/Td (2 - Td or Tdap) 01/08/2024   Medicare Annual Wellness (AWV)  04/28/2024   Pneumonia Vaccine 83+ Years old  Completed   DEXA SCAN  Completed   Zoster Vaccines- Shingrix  Completed   HPV VACCINES  Aged  Out   Meningococcal B Vaccine  Aged Out   Hepatitis C Screening  Discontinued    Health Maintenance  Health Maintenance Due  Topic Date Due   COVID-19 Vaccine (5 - 2024-25 season) 09/08/2022   Health Maintenance Items Addressed: Due for covid vaccine  Additional Screening:  Vision Screening: Recommended annual ophthalmology exams for early detection of glaucoma and other disorders of the eye.  Dental Screening: Recommended annual dental exams for proper oral hygiene  Community Resource Referral / Chronic Care Management: CRR required this visit?  No   CCM required this visit?  No     Plan:     I have personally reviewed and noted the following in the patient's chart:   Medical and social history Use of alcohol, tobacco or illicit drugs  Current medications and supplements including opioid prescriptions. Patient is not currently taking opioid prescriptions. Functional ability and status Nutritional status Physical activity Advanced directives List of other physicians Hospitalizations, surgeries, and ER visits in previous 12 months Vitals Screenings to include cognitive, depression, and falls Referrals and appointments  In addition, I have reviewed and discussed with patient certain preventive protocols, quality metrics, and best practice recommendations. A written personalized care plan for preventive services as well as general preventive health recommendations were provided to patient.     Areatha Beecham, LPN   1/61/0960   After Visit Summary: (MyChart) Due to this being a telephonic visit, the after visit summary with patients personalized plan was offered to patient via MyChart   Notes: Nothing significant to report at this time.

## 2023-04-29 NOTE — Patient Instructions (Signed)
 Susan Welch , Thank you for taking time to come for your Medicare Wellness Visit. I appreciate your ongoing commitment to your health goals. Please review the following plan we discussed and let me know if I can assist you in the future.   Referrals/Orders/Follow-Ups/Clinician Recommendations: none  This is a list of the screening recommended for you and due dates:  Health Maintenance  Topic Date Due   COVID-19 Vaccine (5 - 2024-25 season) 09/08/2022   Flu Shot  08/08/2023   DTaP/Tdap/Td vaccine (2 - Td or Tdap) 01/08/2024   Medicare Annual Wellness Visit  04/28/2024   Pneumonia Vaccine  Completed   DEXA scan (bone density measurement)  Completed   Zoster (Shingles) Vaccine  Completed   HPV Vaccine  Aged Out   Meningitis B Vaccine  Aged Out   Hepatitis C Screening  Discontinued    Advanced directives: (ACP Link)Information on Advanced Care Planning can be found at [place]  Secretary of [facility] Advance Health Care Directives Advance Health Care Directives. [email or web address]   Next Medicare Annual Wellness Visit scheduled for next year: Yes  insert Preventive Care attachment Insert FALL PREVENTION attachment if needed

## 2023-05-08 ENCOUNTER — Telehealth: Payer: Self-pay | Admitting: Cardiology

## 2023-05-08 NOTE — Telephone Encounter (Signed)
 STAT if patient feels like he/she is going to faint   Are you dizzy, lightheaded, or faint now?  Not currently  Have you passed out?  No, but patient says yesterday at work she overheated and felt dizzy/faint. She says it gets really hot at her job and she would like to know if she can take electrolytes when she has to work. She says she only works about 15 hours/week.  Do you have any other symptoms?  No   Have you checked your HR and BP (record if available)?  Hasn't checked BP

## 2023-05-08 NOTE — Telephone Encounter (Signed)
 Spoke to patient she stated she has had 2 episodes of dizziness and feeling faint while at work.No chest pain.No sob.Stated she did not black out.She feels like she just got too hot,but she would like to be seen.Appointment scheduled with Neomi Banks NP 5/6 at 8:00 am.

## 2023-05-12 NOTE — Progress Notes (Signed)
 Cardiology Office Note:  .   Date:  05/13/2023  ID:  Susan Welch, DOB July 02, 1945, MRN [id number] PCP: Annella Kief, NP  [facility] Providers Cardiologist:  Eilleen Grates, MD    History of Present Illness: .   Susan Welch is a 78 y.o. female with history of CAD (V-fib arrest while admitted for knee surgery and found to three-vessel disease with mildly reduced ejection fraction s/p CABG repeat echo with normal LVEF), HTN, HLD  Praluent  previously transitioned to Repatha  due to cost.  Last seen 11/08/2022 by Dr. Jacquiline Maul and doing well from a cardiac perspective recommended for follow-up in 1 year.  She contacted the office earlier this month noting 2 episodes of dizziness and feeling faint while at work and was recommended for clinic visit.  Discussed the use of AI scribe software for clinical note transcription with the patient, who gave verbal consent to proceed.  History of Present Illness Susan Welch is a 77 year old female who presents with episodes of dizziness and lightheadedness. She experiences dizziness and lightheadedness at work, particularly in a warm environment at the [employer] where she works part-time. During one episode, she experienced sweating, nausea, and an urge to vomit after drinking water.  She is on chlorthalidone  and is concerned about her electrolyte balance, especially potassium and sodium levels. Previously tendency toward hyperkalemia which has been controlled with Chlorthalidone . Not monitoring BP at home as BP cuff broke.   Reports no chest pain, palpitations, orthopnea, PND, exertional dyspnea nor swelling.   ROS: Please see the history of present illness.    All other systems reviewed and are negative.   Studies Reviewed: .        Cardiac Studies & Procedures   ______________________________________________________________________________________________     ECHOCARDIOGRAM  ECHOCARDIOGRAM COMPLETE  07/05/2014  Narrative *[facility]* [address], [address] [place], [place] [address] [phone]  ------------------------------------------------------------------- Transthoracic Echocardiography  Patient:    Susan Welch MR #:       [id number] Study Date: 07/05/2014 Gender:     F Age:        34 Height:     157.5 cm Weight:     76.2 kg BSA:        1.85 m^2 Pt. Status: Room:  ATTENDING    Lauro Portal, MD [facility] REFERRING    Eilleen Grates SONOGRAPHER  Juventino Oppenheim, RCS PERFORMING   [facility], Outpatient  cc:  ------------------------------------------------------------------- LV EF: 50% -   55%  ------------------------------------------------------------------- Indications:      Hypertension - benign 401.1.  ------------------------------------------------------------------- History:   PMH:  Hyperlipidemia,  Coronary artery disease.  PMH: Ventricular fibrillation.  ------------------------------------------------------------------- Study Conclusions  - Left ventricle: The cavity size was normal. Wall thickness was normal. Systolic function was normal. The estimated ejection fraction was in the range of 50% to 55%. Mid to distal anteroseptal hypokinesis. Doppler parameters are consistent with abnormal left ventricular relaxation (grade 1 diastolic dysfunction). The E/e&' ratio is between 8-15, suggesting indeterminate LV filling pressure. - Aortic valve: Trileaflet. Sclerosis without stenosis. There was mild regurgitation. - Mitral valve: Calcified annulus. There was mild regurgitation. - Left atrium: The atrium was normal in size.  Impressions:  - Compared to the prior echo in 07/2012, the EF has improved to 50-55%.  ------------------------------------------------------------------- Labs, prior tests, procedures, and surgery: Coronary artery bypass grafting.  Transthoracic echocardiography.  M-mode,  complete 2D, spectral Doppler, and color Doppler.  Birthdate:  Patient birthdate: 04/04/1945.  Age:  Patient is 78 yr old.  Sex:  Gender: female. BMI: 30.7 kg/m^2.  Blood pressure:     171/118  Patient status: Outpatient.  Study date:  Study date: 07/05/2014. Study time: 01:18 PM.  Location:  Echo laboratory.  -------------------------------------------------------------------  ------------------------------------------------------------------- Left ventricle:  The cavity size was normal. Wall thickness was normal. Systolic function was normal. The estimated ejection fraction was in the range of 50% to 55%. Doppler parameters are consistent with abnormal left ventricular relaxation (grade 1 diastolic dysfunction). The E/e&' ratio is between 8-15, suggesting indeterminate LV filling pressure.  ------------------------------------------------------------------- Aortic valve:  Trileaflet. Sclerosis without stenosis.  Doppler: There was mild regurgitation.  ------------------------------------------------------------------- Aorta:  Aortic root: The aortic root was normal in size. Ascending aorta: The ascending aorta was normal in size.  ------------------------------------------------------------------- Mitral valve:   Calcified annulus.  Doppler:  There was mild regurgitation.    Peak gradient (D): 4 mm Hg.  ------------------------------------------------------------------- Left atrium:  The atrium was normal in size.  ------------------------------------------------------------------- Atrial septum:  No defect or patent foramen ovale was identified.  ------------------------------------------------------------------- Right ventricle:  The cavity size was normal. Wall thickness was normal. The moderator band was prominent. Systolic function was normal.  ------------------------------------------------------------------- Tricuspid valve:   Doppler:  There was no  significant regurgitation.  ------------------------------------------------------------------- Right atrium:  The atrium was normal in size.  ------------------------------------------------------------------- Pericardium:  There was no pericardial effusion.  ------------------------------------------------------------------- Systemic veins: Inferior vena cava: The vessel was normal in size. The respirophasic diameter changes were in the normal range (= 50%), consistent with normal central venous pressure. Diameter: 12 mm.  ------------------------------------------------------------------- Measurements  IVC                                        Value        Reference ID                                         12    mm     ---------  Left ventricle                             Value        Reference LV ID, ED, PLAX chordal                    50.4  mm     43 - 52 LV ID, ES, PLAX chordal                    36.6  mm     23 - 38 LV fx shortening, PLAX chordal   (L)       27    %      >=29 LV PW thickness, ED                        7.75  mm     --------- IVS/LV PW ratio, ED                        1.2          <=1.3 Stroke volume, 2D  59    ml     --------- Stroke volume/bsa, 2D                      32    ml/m^2 --------- LV e&', lateral                             9.54  cm/s   --------- LV E/e&', lateral                           10.69        --------- LV e&', medial                              6.91  cm/s   --------- LV E/e&', medial                            14.76        --------- LV e&', average                             8.23  cm/s   --------- LV E/e&', average                           12.4         ---------  Ventricular septum                         Value        Reference IVS thickness, ED                          9.28  mm     ---------  LVOT                                       Value        Reference LVOT ID, S                                 20     mm     --------- LVOT area                                  3.14  cm^2   --------- LVOT peak velocity, S                      72.9  cm/s   --------- LVOT mean velocity, S                      51.6  cm/s   --------- LVOT VTI, S                                18.9  cm     --------- LVOT peak gradient, S  2     mm Hg  ---------  Aortic valve                               Value        Reference Aortic regurg pressure half-time           410   ms     ---------  Aorta                                      Value        Reference Aortic root ID, ED                         36    mm     ---------  Left atrium                                Value        Reference LA ID, A-P, ES                             38    mm     --------- LA ID/bsa, A-P                             2.05  cm/m^2 <=2.2 LA volume, S                               41    ml     --------- LA volume/bsa, S                           22.1  ml/m^2 --------- LA volume, ES, 1-p A4C                     45    ml     --------- LA volume/bsa, ES, 1-p A4C                 24.3  ml/m^2 --------- LA volume, ES, 1-p A2C                     36    ml     --------- LA volume/bsa, ES, 1-p A2C                 19.4  ml/m^2 ---------  Mitral valve                               Value        Reference Mitral E-wave peak velocity                102   cm/s   --------- Mitral A-wave peak velocity                106   cm/s   --------- Mitral deceleration time         (H)       246   ms     150 - 230 Mitral  peak gradient, D                    4     mm Hg  --------- Mitral E/A ratio, peak                     0.96         --------- Mitral regurg VTI, PISA                    216   cm     --------- Mitral ERO, PISA                           0.06  cm^2   --------- Mitral regurg volume, PISA                 13    ml     ---------  Legend: (L)  and  (H)  mark values outside specified reference  range.  ------------------------------------------------------------------- Prepared and Electronically Authenticated by  Dinah Franco MD 2016-06-28T16:49:21          ______________________________________________________________________________________________      Risk Assessment/Calculations:             Physical Exam:   VS:  BP 132/64   Pulse (!) 56   Ht 5\' 2"  (1.575 m)   Wt 150 lb 12.8 oz (68.4 kg)   BMI 27.58 kg/m    Wt Readings from Last 3 Encounters:  05/13/23 150 lb 12.8 oz (68.4 kg)  11/08/22 146 lb 3.2 oz (66.3 kg)  08/07/22 150 lb (68 kg)    GEN: Well nourished, well developed in no acute distress NECK: No JVD; No carotid bruits CARDIAC: RRR, no murmurs, rubs, gallops RESPIRATORY:  Clear to auscultation without rales, wheezing or rhonchi  ABDOMEN: Soft, non-tender, non-distended EXTREMITIES:  No edema; No deformity   ASSESSMENT AND PLAN: .    Assessment & Plan Dizziness and lightheadedness Episodes of lightheadedness and near syncope accompanied by sweating likely due to dehydration and heat exposure as two episodes occurred at work at a dry [employer] while standing in hot temperatures. - Consume 8-16 ounces of an electrolyte drink before or at the start of work shifts. - Consider echocardiogram if symptoms persist. - No palpitations suggestive of arrhythmia.  - Has labs this week with PCP - will request BMET, magnesium , CBC to rule out electrolyte abnormality or anemia.  Hypertension Hypertension well-managed with chlorthalidone  and lisinopril . Blood pressure within target range. Discussed electrolyte drink impact on blood pressure and lisinopril 's effect on potassium levels. - Send prescription for lisinopril  to the pharmacy.  CAD / Hyperlipidemia, LDL goal <70 Stable with no anginal symptoms. No indication for ischemic evaluation.   - Continue GDMT aspirin  81 mg daily, Repatha  140 mg every 14 days, Zetia  10 mg daily.  Prior intolerance to statin  (rosuvastatin , atorvastatin , pravastatin ) with myalgia -Recommend aiming for 150 minutes of moderate intensity activity per week and following a heart healthy diet.    Medication management Plans to use [facility]. Refills sent.         Dispo: follow up 11/2023 with Dr. Lavonne Prairie  Signed, Clearnce Curia, NP

## 2023-05-13 ENCOUNTER — Other Ambulatory Visit (HOSPITAL_BASED_OUTPATIENT_CLINIC_OR_DEPARTMENT_OTHER): Payer: Self-pay

## 2023-05-13 ENCOUNTER — Ambulatory Visit (HOSPITAL_BASED_OUTPATIENT_CLINIC_OR_DEPARTMENT_OTHER): Admitting: Family

## 2023-05-13 ENCOUNTER — Encounter (HOSPITAL_BASED_OUTPATIENT_CLINIC_OR_DEPARTMENT_OTHER): Payer: Self-pay | Admitting: Family

## 2023-05-13 ENCOUNTER — Other Ambulatory Visit: Payer: Self-pay

## 2023-05-13 VITALS — BP 132/64 | HR 56 | Ht 62.0 in | Wt 150.8 lb

## 2023-05-13 DIAGNOSIS — R42 Dizziness and giddiness: Secondary | ICD-10-CM

## 2023-05-13 DIAGNOSIS — I25118 Atherosclerotic heart disease of native coronary artery with other forms of angina pectoris: Secondary | ICD-10-CM | POA: Diagnosis not present

## 2023-05-13 DIAGNOSIS — I10 Essential (primary) hypertension: Secondary | ICD-10-CM

## 2023-05-13 DIAGNOSIS — E785 Hyperlipidemia, unspecified: Secondary | ICD-10-CM

## 2023-05-13 MED ORDER — METOPROLOL TARTRATE 25 MG PO TABS
25.0000 mg | ORAL_TABLET | Freq: Two times a day (BID) | ORAL | 3 refills | Status: AC
  Filled 2023-05-13: qty 180, 90d supply, fill #0
  Filled 2023-08-04: qty 180, 90d supply, fill #1
  Filled 2023-11-03: qty 180, 90d supply, fill #2
  Filled 2024-01-31: qty 180, 90d supply, fill #3

## 2023-05-13 MED ORDER — EZETIMIBE 10 MG PO TABS
10.0000 mg | ORAL_TABLET | Freq: Every day | ORAL | 1 refills | Status: DC
  Filled 2023-05-13 – 2023-07-09 (×6): qty 90, 90d supply, fill #0
  Filled 2023-10-17: qty 90, 90d supply, fill #1

## 2023-05-13 MED ORDER — CHLORTHALIDONE 25 MG PO TABS
25.0000 mg | ORAL_TABLET | Freq: Every day | ORAL | 3 refills | Status: AC
  Filled 2023-05-13 – 2023-07-09 (×6): qty 90, 90d supply, fill #0
  Filled 2023-10-17: qty 90, 90d supply, fill #1
  Filled 2024-01-18: qty 90, 90d supply, fill #2

## 2023-05-13 MED ORDER — REPATHA SURECLICK 140 MG/ML ~~LOC~~ SOAJ
140.0000 mg | SUBCUTANEOUS | 7 refills | Status: AC
  Filled 2023-05-13: qty 6, 84d supply, fill #0
  Filled 2023-07-29: qty 6, 84d supply, fill #1
  Filled 2023-10-21 (×2): qty 6, 84d supply, fill #2
  Filled 2024-01-13: qty 2, 28d supply, fill #3
  Filled 2024-01-26: qty 6, 84d supply, fill #3

## 2023-05-13 MED ORDER — LISINOPRIL 10 MG PO TABS
10.0000 mg | ORAL_TABLET | Freq: Two times a day (BID) | ORAL | 3 refills | Status: AC
  Filled 2023-05-13: qty 180, 90d supply, fill #0
  Filled 2023-08-04: qty 180, 90d supply, fill #1
  Filled 2023-11-03: qty 180, 90d supply, fill #2
  Filled 2024-01-31: qty 180, 90d supply, fill #3

## 2023-05-13 NOTE — Patient Instructions (Signed)
 Medication Instructions:  Your physician recommends that you continue on your current medications as directed. Please refer to the Current Medication list given to you today.  Sent your refills downstairs to the pharmacy!    Follow-Up: At [facility], you and your health needs are our priority.  As part of our continuing mission to provide you with exceptional heart care, our providers are all part of one team.  This team includes your primary Cardiologist (physician) and Advanced Practice Providers or APPs (Physician Assistants and Nurse Practitioners) who all work together to provide you with the care you need, when you need it.  Your next appointment:   November with Dr. Lavonne Prairie- we will call you to schedule this!   ** You can drink 8-16oz of an electrolyte drink before work**

## 2023-05-14 ENCOUNTER — Other Ambulatory Visit (HOSPITAL_BASED_OUTPATIENT_CLINIC_OR_DEPARTMENT_OTHER): Payer: Self-pay

## 2023-05-15 ENCOUNTER — Other Ambulatory Visit: Payer: Self-pay

## 2023-05-15 ENCOUNTER — Encounter: Payer: Self-pay | Admitting: Nurse Practitioner

## 2023-05-15 ENCOUNTER — Ambulatory Visit: Payer: Medicare Other | Admitting: Nurse Practitioner

## 2023-05-15 ENCOUNTER — Other Ambulatory Visit (HOSPITAL_BASED_OUTPATIENT_CLINIC_OR_DEPARTMENT_OTHER): Payer: Self-pay

## 2023-05-15 VITALS — BP 122/80 | HR 68 | Ht 62.0 in | Wt 150.0 lb

## 2023-05-15 DIAGNOSIS — E785 Hyperlipidemia, unspecified: Secondary | ICD-10-CM

## 2023-05-15 DIAGNOSIS — L820 Inflamed seborrheic keratosis: Secondary | ICD-10-CM

## 2023-05-15 DIAGNOSIS — I10 Essential (primary) hypertension: Secondary | ICD-10-CM

## 2023-05-15 DIAGNOSIS — R55 Syncope and collapse: Secondary | ICD-10-CM | POA: Diagnosis not present

## 2023-05-15 DIAGNOSIS — E860 Dehydration: Secondary | ICD-10-CM

## 2023-05-15 DIAGNOSIS — E875 Hyperkalemia: Secondary | ICD-10-CM | POA: Diagnosis not present

## 2023-05-15 DIAGNOSIS — Z0000 Encounter for general adult medical examination without abnormal findings: Secondary | ICD-10-CM

## 2023-05-15 DIAGNOSIS — I429 Cardiomyopathy, unspecified: Secondary | ICD-10-CM

## 2023-05-15 DIAGNOSIS — M1711 Unilateral primary osteoarthritis, right knee: Secondary | ICD-10-CM

## 2023-05-15 MED ORDER — MELOXICAM 15 MG PO TABS
15.0000 mg | ORAL_TABLET | Freq: Every day | ORAL | 3 refills | Status: AC
  Filled 2023-05-15: qty 30, 30d supply, fill #0
  Filled 2023-06-09: qty 30, 30d supply, fill #1
  Filled 2023-07-09: qty 30, 30d supply, fill #2
  Filled 2023-10-17: qty 30, 30d supply, fill #3

## 2023-05-15 MED ORDER — MOMETASONE FUROATE 0.1 % EX CREA
TOPICAL_CREAM | CUTANEOUS | 5 refills | Status: AC
  Filled 2023-05-15: qty 45, 30d supply, fill #0

## 2023-05-15 NOTE — Patient Instructions (Signed)

## 2023-05-15 NOTE — Progress Notes (Signed)
 Dell Fennel, DNP, AGNP-c [facility] [address] [place], [place] [address] Main Office [phone]  BP 122/80   Pulse 68   Ht 5\' 2"  (1.575 m)   Wt 150 lb (68 kg)   BMI 27.44 kg/m    Subjective:    Patient ID: Susan Welch, female    DOB: 01-19-1945, 78 y.o.   MRN: [id number]  HPI: Susan Welch is a 78 y.o. female presenting on 05/15/2023 for comprehensive medical examination.   She also has concerns with dizziness and near syncope episodes.  She experienced an episode of dizziness and near syncope on a recent Friday, accompanied by feeling overheated, vomiting, and extreme dizziness. She declined an ambulance and rested until she felt better. 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  She has a history of issues with potassium levels and is cautious about electrolyte intake. She plans to drink 16 ounces of an electrolyte solution before her shifts on hot days to stay hydrated. On days off, she drinks about 32 ounces of water, but she acknowledges not drinking the recommended 64 ounces daily. She drinks more water on workdays.  She experienced a severe Charley horse recently, which she attributes to either potassium issues or dehydration. She uses [employer] for cramping, which has been effective in the past. She has not had significant issues with cramping for about six months until the recent episode.  She has a history of seborrheic keratosis, which she manages with mometasone  cream. She uses the cream sparingly, only when the keratoses become bothersome or itchy, particularly around her bra line.  She reports allergies that make it difficult to breathe, but she avoids most allergy medications due to their sedative effects. She uses Flonase and nasal rinses to manage her symptoms.  No current shortness of breath, chest pain, or changes in bowel or bladder habits. No numbness or tingling in her hands or feet, and no swelling in her feet. She experiences cramping  occasionally.  Pertinent items are noted in HPI.   Most Recent Depression Screen:     05/15/2023    9:39 AM 04/29/2023   10:57 AM 05/13/2022    3:04 PM 04/23/2022   11:04 AM 08/03/2020   11:20 AM  Depression screen PHQ 2/9  Decreased Interest 0 0 0 0 0  Down, Depressed, Hopeless 0 0 0 0 0  PHQ - 2 Score 0 0 0 0 0  Altered sleeping  1  1   Tired, decreased energy  0  0   Change in appetite  0  0   Feeling bad or failure about yourself   0  0   Trouble concentrating  0  0   Moving slowly or fidgety/restless  0  0   Suicidal thoughts  0  0   PHQ-9 Score  1  1   Difficult doing work/chores  Not difficult at all  Not difficult at all    Most Recent Anxiety Screen:     05/02/2020    2:50 PM  GAD 7 : Generalized Anxiety Score  Nervous, Anxious, on Edge 0  Control/stop worrying 0  Worry too much - different things 0  Trouble relaxing 0  Restless 0  Easily annoyed or irritable 0  Afraid - awful might happen 0  Total GAD 7 Score 0   Most Recent Fall Screen:    05/15/2023    9:39 AM 04/29/2023   10:57 AM 05/13/2022    3:04 PM 04/23/2022   11:04 AM 08/03/2020  11:19 AM  Fall Risk   Falls in the past year? 0 0 0 0 0  Number falls in past yr: 0 0 0 0 0  Injury with Fall? 0 0 0 0 0  Risk for fall due to : No Fall Risks Medication side effect No Fall Risks Medication side effect No Fall Risks  Follow up Falls evaluation completed Falls prevention discussed;Falls evaluation completed Falls evaluation completed Falls prevention discussed;Education provided;Falls evaluation completed Falls evaluation completed;Falls prevention discussed    Past medical history, surgical history, medications, allergies, family history and social history reviewed with patient today and changes made to appropriate areas of the chart.  Past Medical History:  Past Medical History:  Diagnosis Date  . Anemia   . Anxiety    Hx: of situational anxiety  . Arthritis    a. bilat knees s/p R TKA 07/15/2012  .  Carotid stenosis    Pre-CABG Dopplers 07/19/12: Right 40-59%.  . Coronary artery disease    a. s/p VF arrest during TKR 7/14 2/2 NSTEMI => LHC with 3v CAD =>  s/p CABG 07/21/12 (LIMA-LAD, SVG-RI and OM1, SVG-distal RCA).   . Coronary artery disease involving native coronary artery of native heart without angina pectoris 06/17/2016  . DJD (degenerative joint disease) of knee 09/21/2014  . DVT (deep venous thrombosis) (HCC) 07/2012  . Educated about COVID-19 virus infection 10/11/2019  . Encounter to establish care 05/03/2020  . GERD (gastroesophageal reflux disease)   . Headache(784.0)    Hx: of migraines  . HTN (hypertension) 09/20/2011  . Hyperlipidemia   . Hypertension   . Ischemic cardiomyopathy    Echo 07/19/12: EF 40-45%, basal to mid anteroseptal AK, distal anteroseptal HK, mild MR.   . Knee joint replacement by other means 07/26/2012  . Lack of interest 05/03/2020  . Medication management 06/17/2016  . Osteoarthritis of right knee 07/16/2012  . Panic attacks   . Piriformis syndrome of right side 05/03/2020  . Pneumonia   . Serum potassium elevated 12/14/2015  . Ventricular fibrillation (HCC)    a. 07/15/2012 in setting of R TKA  . Wears glasses    Medications:  Current Outpatient Medications on File Prior to Visit  Medication Sig  . aspirin  81 MG tablet Take 81 mg by mouth daily.  . calcium  citrate-vitamin D  (CITRACAL+D) 315-200 MG-UNIT per tablet Take 1 tablet by mouth daily.  . chlorthalidone  (HYGROTON ) 25 MG tablet Take 1 tablet (25 mg total) by mouth daily.  . Cholecalciferol (VITAMIN D ) 2000 UNITS tablet Take 1 tablet (2,000 Units total) by mouth daily.  . Coenzyme Q10 (CO Q10) 200 MG CAPS Take 200 mg by mouth daily.  . Evolocumab  (REPATHA  SURECLICK) 140 MG/ML SOAJ Inject 140 mg into the skin every 14 (fourteen) days.  . ezetimibe  (ZETIA ) 10 MG tablet Take 1 tablet (10 mg total) by mouth daily.  . folic acid  (FOLVITE ) 400 MCG tablet Take by mouth daily.   . lisinopril   (ZESTRIL ) 10 MG tablet Take 1 tablet (10 mg total) by mouth 2 (two) times daily.  . metoprolol  tartrate (LOPRESSOR ) 25 MG tablet Take 1 tablet (25 mg total) by mouth 2 (two) times daily.  Aaron Aas omeprazole (PRILOSEC) 10 MG capsule Take 10 mg by mouth daily.   No current facility-administered medications on file prior to visit.   Surgical History:  Past Surgical History:  Procedure Laterality Date  . ABDOMINAL HYSTERECTOMY    . CORONARY ARTERY BYPASS GRAFT N/A 07/21/2012   Procedure: CORONARY ARTERY BYPASS  GRAFTING (CABG);  Surgeon: Norita Beauvais, MD;  Location: [facility] OR;  Service: Open Heart Surgery;  Laterality: N/A;  Times 4 using left internal mammary artery and endoscopically harvested left saphenous vein.  Aaron Aas DILATION AND CURETTAGE OF UTERUS    . INTRAOPERATIVE TRANSESOPHAGEAL ECHOCARDIOGRAM N/A 07/21/2012   Procedure: INTRAOPERATIVE TRANSESOPHAGEAL ECHOCARDIOGRAM;  Surgeon: Norita Beauvais, MD;  Location: [facility] OR;  Service: Open Heart Surgery;  Laterality: N/A;  . LEFT HEART CATHETERIZATION WITH CORONARY ANGIOGRAM N/A 07/17/2012   Procedure: LEFT HEART CATHETERIZATION WITH CORONARY ANGIOGRAM;  Surgeon: Eilleen Grates, MD;  Location: [facility] CATH LAB;  Service: Cardiovascular;  Laterality: N/A;  . TOTAL KNEE ARTHROPLASTY Right 07/15/2012   Procedure: TOTAL KNEE ARTHROPLASTY;  Surgeon: Ferd Householder, MD;  Location: [facility] OR;  Service: Orthopedics;  Laterality: Right;  drapes pulled back to provide cpr, new drape applied, protocol followed  . TOTAL KNEE ARTHROPLASTY Left 09/21/2014   Procedure: TOTAL LEFT KNEE ARTHROPLASTY;  Surgeon: Ferd Householder, MD;  Location: [facility] OR;  Service: Orthopedics;  Laterality: Left;   Allergies:  Allergies  Allergen Reactions  . Oxycodone  Other (See Comments)    Flu like symptoms.  . Crestor  [Rosuvastatin ]     myalgias  . Latex   . Pravastatin      myaglias  . Statins Other (See Comments)    Joint pain with Lipitor daily, Pravastatin  10 mg qd and Pravastatin  10 mg  M/W/F, Crestor  10 mg once weekly also caused joint aches  . Nickel Rash   Family History:  Family History  Problem Relation Age of Onset  . Thyroid  disease Mother        has thyroid  removed  . Colitis Mother   . COPD Sister        Objective:    BP 122/80   Pulse 68   Ht 5\' 2"  (1.575 m)   Wt 150 lb (68 kg)   BMI 27.44 kg/m   Wt Readings from Last 3 Encounters:  05/15/23 150 lb (68 kg)  05/13/23 150 lb 12.8 oz (68.4 kg)  11/08/22 146 lb 3.2 oz (66.3 kg)    Physical Exam Vitals and nursing note reviewed.  Constitutional:      General: She is not in acute distress.    Appearance: Normal appearance.  HENT:     Head: Normocephalic and atraumatic.     Right Ear: Hearing, tympanic membrane, ear canal and external ear normal.     Left Ear: Hearing, tympanic membrane, ear canal and external ear normal.     Nose: Nose normal.     Right Sinus: No maxillary sinus tenderness or frontal sinus tenderness.     Left Sinus: No maxillary sinus tenderness or frontal sinus tenderness.     Mouth/Throat:     Lips: Pink.     Mouth: Mucous membranes are moist.     Pharynx: Oropharynx is clear.  Eyes:     General: Lids are normal. Vision grossly intact.     Extraocular Movements: Extraocular movements intact.     Conjunctiva/sclera: Conjunctivae normal.     Pupils: Pupils are equal, round, and reactive to light.     Funduscopic exam:    Right eye: Red reflex present.        Left eye: Red reflex present.    Visual Fields: Right eye visual fields normal and left eye visual fields normal.  Neck:     Thyroid : No thyromegaly.     Vascular: No carotid bruit.  Cardiovascular:  Rate and Rhythm: Normal rate and regular rhythm.     Chest Wall: PMI is not displaced.     Pulses: Normal pulses.          Dorsalis pedis pulses are 2+ on the right side and 2+ on the left side.       Posterior tibial pulses are 2+ on the right side and 2+ on the left side.     Heart sounds: Murmur heard.   Pulmonary:     Effort: Pulmonary effort is normal. No respiratory distress.     Breath sounds: Normal breath sounds.  Abdominal:     General: Abdomen is flat. Bowel sounds are normal. There is no distension.     Palpations: Abdomen is soft. There is no hepatomegaly, splenomegaly or mass.     Tenderness: There is no abdominal tenderness. There is no right CVA tenderness, left CVA tenderness, guarding or rebound.  Musculoskeletal:        General: Normal range of motion.     Cervical back: Full passive range of motion without pain, normal range of motion and neck supple. No tenderness.     Right lower leg: No edema.     Left lower leg: No edema.  Feet:     Left foot:     Toenail Condition: Left toenails are normal.  Lymphadenopathy:     Cervical: No cervical adenopathy.     Upper Body:     Right upper body: No supraclavicular adenopathy.     Left upper body: No supraclavicular adenopathy.  Skin:    General: Skin is warm and dry.     Capillary Refill: Capillary refill takes less than 2 seconds.     Nails: There is no clubbing.  Neurological:     General: No focal deficit present.     Mental Status: She is alert and oriented to person, place, and time.     GCS: GCS eye subscore is 4. GCS verbal subscore is 5. GCS motor subscore is 6.     Sensory: Sensation is intact.     Motor: Motor function is intact.     Coordination: Coordination is intact.     Gait: Gait is intact.     Deep Tendon Reflexes: Reflexes are normal and symmetric.  Psychiatric:        Attention and Perception: Attention normal.        Mood and Affect: Mood normal.        Speech: Speech normal.        Behavior: Behavior normal. Behavior is cooperative.        Thought Content: Thought content normal.        Cognition and Memory: Cognition and memory normal.        Judgment: Judgment normal.     Results for orders placed or performed in visit on 05/15/23  CBC with Differential/Platelet   Collection Time:  05/15/23 11:28 AM  Result Value Ref Range   WBC 8.5 3.4 - 10.8 x10E3/uL   RBC 4.58 3.77 - 5.28 x10E6/uL   Hemoglobin 13.8 11.1 - 15.9 g/dL   Hematocrit 16.1 09.6 - 46.6 %   MCV 92 79 - 97 fL   MCH 30.1 26.6 - 33.0 pg   MCHC 32.9 31.5 - 35.7 g/dL   RDW 04.5 40.9 - 81.1 %   Platelets 320 150 - 450 x10E3/uL   Neutrophils 65 Not Estab. %   Lymphs 22 Not Estab. %   Monocytes 7 Not Estab. %  Eos 5 Not Estab. %   Basos 1 Not Estab. %   Neutrophils Absolute 5.5 1.4 - 7.0 x10E3/uL   Lymphocytes Absolute 1.9 0.7 - 3.1 x10E3/uL   Monocytes Absolute 0.6 0.1 - 0.9 x10E3/uL   EOS (ABSOLUTE) 0.4 0.0 - 0.4 x10E3/uL   Basophils Absolute 0.1 0.0 - 0.2 x10E3/uL   Immature Granulocytes 0 Not Estab. %   Immature Grans (Abs) 0.0 0.0 - 0.1 x10E3/uL  CMP14+EGFR   Collection Time: 05/15/23 11:28 AM  Result Value Ref Range   Glucose 90 70 - 99 mg/dL   BUN 29 (H) 8 - 27 mg/dL   Creatinine, Ser 3.08 (H) 0.57 - 1.00 mg/dL   eGFR 54 (L) >65 HQ/ION/6.29   BUN/Creatinine Ratio 27 12 - 28   Sodium 141 134 - 144 mmol/L   Potassium 4.2 3.5 - 5.2 mmol/L   Chloride 101 96 - 106 mmol/L   CO2 24 20 - 29 mmol/L   Calcium  10.4 (H) 8.7 - 10.3 mg/dL   Total Protein 7.4 6.0 - 8.5 g/dL   Albumin  4.9 (H) 3.8 - 4.8 g/dL   Globulin, Total 2.5 1.5 - 4.5 g/dL   Bilirubin Total 0.3 0.0 - 1.2 mg/dL   Alkaline Phosphatase 60 44 - 121 IU/L   AST 19 0 - 40 IU/L   ALT 14 0 - 32 IU/L  Hemoglobin A1c   Collection Time: 05/15/23 11:28 AM  Result Value Ref Range   Hgb A1c MFr Bld 6.0 (H) 4.8 - 5.6 %   Est. average glucose Bld gHb Est-mCnc 126 mg/dL  Lipid panel   Collection Time: 05/15/23 11:28 AM  Result Value Ref Range   Cholesterol, Total 152 100 - 199 mg/dL   Triglycerides 528 0 - 149 mg/dL   HDL 63 >41 mg/dL   VLDL Cholesterol Cal 22 5 - 40 mg/dL   LDL Chol Calc (NIH) 67 0 - 99 mg/dL   Chol/HDL Ratio 2.4 0.0 - 4.4 ratio       Assessment & Plan:   Problem List Items Addressed This Visit     Serum potassium  elevated (Chronic)   Recent severe leg cramp likely related to dehydration and possible potassium imbalance. Occasional cramps in the past, managed with [employer]. No current swelling or frequent occurrence. Discussed potential role of dehydration and potassium imbalance in cramp occurrence. - Monitor hydration status and ensure adequate fluid intake. - Consider potassium levels in lab work to rule out imbalance. - Advise on potential use of salt intake to maintain fluid balance during periods of sweating.      HTN (hypertension)   Well controlled at this time. Monitor electrolytes due to chlorthalidone  use. Recommend increased water intake.       Relevant Orders   CBC with Differential/Platelet (Completed)   CMP14+EGFR (Completed)   Hemoglobin A1c (Completed)   Lipid panel (Completed)   Osteoarthritis of right knee   PRN meloxicam  effective in management,       Relevant Medications   meloxicam  (MOBIC ) 15 MG tablet   Dyslipidemia   Chronic dyslipidemia in the setting of coronary artery disease.  Patient is known to be intolerant of Lipitor, Crestor , and pravastatin  all with daily and once weekly dosing.  At this time she is currently managed with Zetia .  Recommend strict dietary adherence for optimal control and close monitoring of labs.      Relevant Orders   Lipid panel (Completed)   Cardiomyopathy (HCC)   Followed closely with cardiology with no alarm  symptoms at this time. Continue to monitor.       Encounter for annual physical exam - Primary   Routine wellness visit focused on recent dizziness and near syncope due to dehydration. No significant changes in vision, hearing, or sinuses. Allergies managed with minimal medication due to sedative effects. No significant changes in bowel or bladder habits. No chest pain or shortness of breath. Blood pressure is well-controlled. - Perform routine lab work to check potassium levels and other relevant markers. - Encourage increased  fluid intake, especially on days prior to work and during hot weather. - Advise on strategies to increase water intake, such as setting reminders or using a marked water bottle. - Discuss potential use of high-protein snacks to maintain stable blood sugar levels. - Provide handout on high-protein snacks.      Relevant Orders   CBC with Differential/Platelet (Completed)   CMP14+EGFR (Completed)   Hemoglobin A1c (Completed)   Lipid panel (Completed)   Pre-syncope   Recent dizziness and near syncope likely due to dehydration, exacerbated by heat and insufficient fluid intake. Symptoms included nausea, dizziness, and cessation of sweating. Requires careful management of electrolyte intake due to hyperkalemia. Importance of maintaining hydration and electrolyte balance, especially before work on hot days, was discussed. - Encourage fluid intake of at least 64 ounces of water daily, especially on non-working days. - Advise consumption of electrolyte-rich foods and drinks before work on hot days. - Recommend laying flat with feet elevated during dizziness to improve circulation. - Monitor potassium levels through lab work.      Relevant Orders   CBC with Differential/Platelet (Completed)   CMP14+EGFR (Completed)   Hemoglobin A1c (Completed)   Dehydration   Recent dizziness and near syncope likely due to dehydration, exacerbated by heat and insufficient fluid intake. Symptoms included nausea, dizziness, and cessation of sweating. Requires careful management of electrolyte intake due to hyperkalemia. Importance of maintaining hydration and electrolyte balance, especially before work on hot days, was discussed. - Encourage fluid intake of at least 64 ounces of water daily, especially on non-working days. - Advise consumption of electrolyte-rich foods and drinks before work on hot days. - Recommend laying flat with feet elevated during dizziness to improve circulation. - Monitor potassium levels  through lab work.      Relevant Orders   CBC with Differential/Platelet (Completed)   CMP14+EGFR (Completed)   Hemoglobin A1c (Completed)   Inflamed seborrheic keratosis   Multiple seborrheic keratoses, particularly on the back, causing occasional itching. Managed with mometasone  cream, which helps reduce itching and causes lesions to peel off. - Refill mometasone  cream for use as needed when lesions become bothersome.      Relevant Medications   mometasone  (ELOCON ) 0.1 % cream      Follow up plan: Return in about 1 year (around 05/14/2024) for CPE.  NEXT PREVENTATIVE PHYSICAL DUE IN 1 YEAR.  PATIENT COUNSELING PROVIDED FOR ALL ADULT PATIENTS: A well balanced diet low in saturated fats, cholesterol, and moderation in carbohydrates.  This can be as simple as monitoring portion sizes and cutting back on sugary beverages such as soda and juice to start with.    Daily water consumption of at least 64 ounces.  Physical activity at least 180 minutes per week.  If just starting out, start 10 minutes a day and work your way up.   This can be as simple as taking the stairs instead of the elevator and walking 2-3 laps around the office  purposefully every day.  STD protection, partner selection, and regular testing if high risk.  Limited consumption of alcoholic beverages if alcohol is consumed. For men, I recommend no more than 14 alcoholic beverages per week, spread out throughout the week (max 2 per day). Avoid "binge" drinking or consuming large quantities of alcohol in one setting.  Please let me know if you feel you may need help with reduction or quitting alcohol consumption.   Avoidance of nicotine, if used. Please let me know if you feel you may need help with reduction or quitting nicotine use.   Daily mental health attention. This can be in the form of 5 minute daily meditation, prayer, journaling, yoga, reflection, etc.  Purposeful attention to your emotions and mental  state can significantly improve your overall wellbeing  and  Health.  Please know that I am here to help you with all of your health care goals and am happy to work with you to find a solution that works best for you.  The greatest advice I have received with any changes in life are to take it one step at a time, that even means if all you can focus on is the next 60 seconds, then do that and celebrate your victories.  With any changes in life, you will have set backs, and that is OK. The important thing to remember is, if you have a set back, it is not a failure, it is an opportunity to try again! Screening Testing Mammogram Every 1 -2 years based on history and risk factors Starting at age 15 Pap Smear Ages 21-39 every 3 years Ages 80-65 every 5 years with HPV testing More frequent testing may be required based on results and history Colon Cancer Screening Every 1-10 years based on test performed, risk factors, and history Starting at age 76 Bone Density Screening Every 2-10 years based on history Starting at age 30 for women Recommendations for men differ based on medication usage, history, and risk factors AAA Screening One time ultrasound Men 20-42 years old who have every smoked Lung Cancer Screening Low Dose Lung CT every 12 months Age 6-80 years with a 30 pack-year smoking history who still smoke or who have quit within the last 15 years   Screening Labs Routine  Labs: Complete Blood Count (CBC), Complete Metabolic Panel (CMP), Cholesterol (Lipid Panel) Every 6-12 months based on history and medications May be recommended more frequently based on current conditions or previous results Hemoglobin A1c Lab Every 3-12 months based on history and previous results Starting at age 30 or earlier with diagnosis of diabetes, high cholesterol, BMI >26, and/or risk factors Frequent monitoring for patients with diabetes to ensure blood sugar control Thyroid  Panel (TSH) Every 6 months  based on history, symptoms, and risk factors May be repeated more often if on medication HIV One time testing for all patients 79 and older May be repeated more frequently for patients with increased risk factors or exposure Hepatitis C One time testing for all patients 26 and older May be repeated more frequently for patients with increased risk factors or exposure Gonorrhea, Chlamydia Every 12 months for all sexually active persons 13-24 years Additional monitoring may be recommended for those who are considered high risk or who have symptoms Every 12 months for any woman on birth control, regardless of sexual activity PSA Men 40-27 years old with risk factors Additional screening may be recommended from age 62-69 based on risk factors, symptoms, and history  Vaccine Recommendations Tetanus Booster All  adults every 10 years Flu Vaccine All patients 6 months and older every year COVID Vaccine All patients 12 years and older Initial dosing with booster May recommend additional booster based on age and health history HPV Vaccine 2 doses all patients age 53-26 Dosing may be considered for patients over 26 Shingles Vaccine (Shingrix) 2 doses all adults 55 years and older Pneumonia (Pneumovax 23) All adults 65 years and older May recommend earlier dosing based on health history One year apart from Prevnar 13 Pneumonia (Prevnar 41) All adults 65 years and older Dosed 1 year after Pneumovax 23 Pneumonia (Prevnar 20) One time alternative to the two dosing of 13 and 23 For all adults with initial dose of 23, 20 is recommended 1 year later For all adults with initial dose of 13, 23 is still recommended as second option 1 year later

## 2023-05-16 ENCOUNTER — Other Ambulatory Visit (HOSPITAL_BASED_OUTPATIENT_CLINIC_OR_DEPARTMENT_OTHER): Payer: Self-pay

## 2023-05-16 LAB — LIPID PANEL
Chol/HDL Ratio: 2.4 ratio (ref 0.0–4.4)
Cholesterol, Total: 152 mg/dL (ref 100–199)
HDL: 63 mg/dL (ref >39)
LDL Chol Calc (NIH): 67 mg/dL (ref 0–99)
Triglycerides: 127 mg/dL (ref 0–149)
VLDL Cholesterol Cal: 22 mg/dL (ref 5–40)

## 2023-05-16 LAB — CBC WITH DIFFERENTIAL/PLATELET
Basophils Absolute: 0.1 10*3/uL (ref 0.0–0.2)
Basos: 1 %
EOS (ABSOLUTE): 0.4 10*3/uL (ref 0.0–0.4)
Eos: 5 %
Hematocrit: 42 % (ref 34.0–46.6)
Hemoglobin: 13.8 g/dL (ref 11.1–15.9)
Immature Grans (Abs): 0 10*3/uL (ref 0.0–0.1)
Immature Granulocytes: 0 %
Lymphocytes Absolute: 1.9 10*3/uL (ref 0.7–3.1)
Lymphs: 22 %
MCH: 30.1 pg (ref 26.6–33.0)
MCHC: 32.9 g/dL (ref 31.5–35.7)
MCV: 92 fL (ref 79–97)
Monocytes Absolute: 0.6 10*3/uL (ref 0.1–0.9)
Monocytes: 7 %
Neutrophils Absolute: 5.5 10*3/uL (ref 1.4–7.0)
Neutrophils: 65 %
Platelets: 320 10*3/uL (ref 150–450)
RBC: 4.58 x10E6/uL (ref 3.77–5.28)
RDW: 13.2 % (ref 11.7–15.4)
WBC: 8.5 10*3/uL (ref 3.4–10.8)

## 2023-05-16 LAB — CMP14+EGFR
ALT: 14 IU/L (ref 0–32)
AST: 19 IU/L (ref 0–40)
Albumin: 4.9 g/dL — ABNORMAL HIGH (ref 3.8–4.8)
Alkaline Phosphatase: 60 IU/L (ref 44–121)
BUN/Creatinine Ratio: 27 (ref 12–28)
BUN: 29 mg/dL — ABNORMAL HIGH (ref 8–27)
Bilirubin Total: 0.3 mg/dL (ref 0.0–1.2)
CO2: 24 mmol/L (ref 20–29)
Calcium: 10.4 mg/dL — ABNORMAL HIGH (ref 8.7–10.3)
Chloride: 101 mmol/L (ref 96–106)
Creatinine, Ser: 1.06 mg/dL — ABNORMAL HIGH (ref 0.57–1.00)
Globulin, Total: 2.5 g/dL (ref 1.5–4.5)
Glucose: 90 mg/dL (ref 70–99)
Potassium: 4.2 mmol/L (ref 3.5–5.2)
Sodium: 141 mmol/L (ref 134–144)
Total Protein: 7.4 g/dL (ref 6.0–8.5)
eGFR: 54 mL/min/{1.73_m2} — ABNORMAL LOW (ref >59)

## 2023-05-16 LAB — HEMOGLOBIN A1C
Est. average glucose Bld gHb Est-mCnc: 126 mg/dL
Hgb A1c MFr Bld: 6 % — ABNORMAL HIGH (ref 4.8–5.6)

## 2023-05-19 ENCOUNTER — Encounter: Payer: Self-pay | Admitting: *Deleted

## 2023-05-20 ENCOUNTER — Ambulatory Visit: Payer: Self-pay | Admitting: *Deleted

## 2023-05-26 NOTE — Progress Notes (Signed)
 I was asked to look at these results since Susan Welch is out of the office..  Overall labs pretty stable.  Calcium  is little elevated.  Kidney marker abnormal but stable.  So unless she has some pressing concerns I will defer this to Susan Welch to review when she gets back and contact patient

## 2023-06-03 ENCOUNTER — Encounter: Payer: Self-pay | Admitting: Nurse Practitioner

## 2023-06-03 DIAGNOSIS — L820 Inflamed seborrheic keratosis: Secondary | ICD-10-CM | POA: Insufficient documentation

## 2023-06-03 DIAGNOSIS — I4901 Ventricular fibrillation: Secondary | ICD-10-CM | POA: Insufficient documentation

## 2023-06-03 NOTE — Assessment & Plan Note (Signed)
 Recent severe leg cramp likely related to dehydration and possible potassium imbalance. Occasional cramps in the past, managed with [employer]. No current swelling or frequent occurrence. Discussed potential role of dehydration and potassium imbalance in cramp occurrence. - Monitor hydration status and ensure adequate fluid intake. - Consider potassium levels in lab work to rule out imbalance. - Advise on potential use of salt intake to maintain fluid balance during periods of sweating.

## 2023-06-03 NOTE — Assessment & Plan Note (Addendum)
 Recent dizziness and near syncope likely due to dehydration, exacerbated by heat and insufficient fluid intake. Symptoms included nausea, dizziness, and cessation of sweating. Requires careful management of electrolyte intake due to hyperkalemia. Importance of maintaining hydration and electrolyte balance, especially before work on hot days, was discussed. - Encourage fluid intake of at least 64 ounces of water daily, especially on non-working days. - Advise consumption of electrolyte-rich foods and drinks before work on hot days. - Recommend laying flat with feet elevated during dizziness to improve circulation. - Monitor potassium levels through lab work.

## 2023-06-03 NOTE — Assessment & Plan Note (Signed)
Chronic dyslipidemia in the setting of coronary artery disease.  Patient is known to be intolerant of Lipitor, Crestor, and pravastatin all with daily and once weekly dosing.  At this time she is currently managed with Zetia.  Recommend strict dietary adherence for optimal control and close monitoring of labs.

## 2023-06-03 NOTE — Assessment & Plan Note (Signed)
 Multiple seborrheic keratoses, particularly on the back, causing occasional itching. Managed with mometasone  cream, which helps reduce itching and causes lesions to peel off. - Refill mometasone  cream for use as needed when lesions become bothersome.

## 2023-06-03 NOTE — Assessment & Plan Note (Signed)
 PRN meloxicam  effective in management,

## 2023-06-03 NOTE — Assessment & Plan Note (Signed)
 Followed closely with cardiology with no alarm symptoms at this time. Continue to monitor.

## 2023-06-03 NOTE — Assessment & Plan Note (Signed)
 Well controlled at this time. Monitor electrolytes due to chlorthalidone  use. Recommend increased water intake.

## 2023-06-03 NOTE — Assessment & Plan Note (Signed)
 Recent dizziness and near syncope likely due to dehydration, exacerbated by heat and insufficient fluid intake. Symptoms included nausea, dizziness, and cessation of sweating. Requires careful management of electrolyte intake due to hyperkalemia. Importance of maintaining hydration and electrolyte balance, especially before work on hot days, was discussed. - Encourage fluid intake of at least 64 ounces of water daily, especially on non-working days. - Advise consumption of electrolyte-rich foods and drinks before work on hot days. - Recommend laying flat with feet elevated during dizziness to improve circulation. - Monitor potassium levels through lab work.

## 2023-06-03 NOTE — Assessment & Plan Note (Signed)
 Routine wellness visit focused on recent dizziness and near syncope due to dehydration. No significant changes in vision, hearing, or sinuses. Allergies managed with minimal medication due to sedative effects. No significant changes in bowel or bladder habits. No chest pain or shortness of breath. Blood pressure is well-controlled. - Perform routine lab work to check potassium levels and other relevant markers. - Encourage increased fluid intake, especially on days prior to work and during hot weather. - Advise on strategies to increase water intake, such as setting reminders or using a marked water bottle. - Discuss potential use of high-protein snacks to maintain stable blood sugar levels. - Provide handout on high-protein snacks.

## 2023-06-09 ENCOUNTER — Other Ambulatory Visit (HOSPITAL_BASED_OUTPATIENT_CLINIC_OR_DEPARTMENT_OTHER): Payer: Self-pay

## 2023-06-09 ENCOUNTER — Other Ambulatory Visit: Payer: Self-pay

## 2023-07-09 ENCOUNTER — Other Ambulatory Visit (HOSPITAL_BASED_OUTPATIENT_CLINIC_OR_DEPARTMENT_OTHER): Payer: Self-pay

## 2023-07-29 ENCOUNTER — Other Ambulatory Visit (HOSPITAL_BASED_OUTPATIENT_CLINIC_OR_DEPARTMENT_OTHER): Payer: Self-pay

## 2023-10-16 ENCOUNTER — Emergency Department (HOSPITAL_COMMUNITY)
Admission: EM | Admit: 2023-10-16 | Discharge: 2023-10-16 | Discharge status: Against Medical Advice | Attending: Emergency Medicine | Admitting: Emergency Medicine

## 2023-10-16 ENCOUNTER — Telehealth: Payer: Self-pay | Admitting: Cardiology

## 2023-10-16 ENCOUNTER — Other Ambulatory Visit: Payer: Self-pay

## 2023-10-16 ENCOUNTER — Ambulatory Visit: Payer: Self-pay

## 2023-10-16 ENCOUNTER — Emergency Department (HOSPITAL_COMMUNITY)

## 2023-10-16 DIAGNOSIS — M79605 Pain in left leg: Secondary | ICD-10-CM | POA: Diagnosis not present

## 2023-10-16 DIAGNOSIS — Z5321 Procedure and treatment not carried out due to patient leaving prior to being seen by health care provider: Secondary | ICD-10-CM | POA: Diagnosis not present

## 2023-10-16 DIAGNOSIS — M79662 Pain in left lower leg: Secondary | ICD-10-CM | POA: Diagnosis not present

## 2023-10-16 LAB — CBC WITH DIFFERENTIAL/PLATELET
Abs Immature Granulocytes: 0.03 K/uL (ref 0.00–0.07)
Basophils Absolute: 0.1 K/uL (ref 0.0–0.1)
Basophils Relative: 1 %
Eosinophils Absolute: 0.4 K/uL (ref 0.0–0.5)
Eosinophils Relative: 4 %
HCT: 37.1 % (ref 36.0–46.0)
Hemoglobin: 12.2 g/dL (ref 12.0–15.0)
Immature Granulocytes: 0 %
Lymphocytes Relative: 25 %
Lymphs Abs: 2.5 K/uL (ref 0.7–4.0)
MCH: 29.5 pg (ref 26.0–34.0)
MCHC: 32.9 g/dL (ref 30.0–36.0)
MCV: 89.8 fL (ref 80.0–100.0)
Monocytes Absolute: 0.6 K/uL (ref 0.1–1.0)
Monocytes Relative: 6 %
Neutro Abs: 6.4 K/uL (ref 1.7–7.7)
Neutrophils Relative %: 64 %
Platelets: 290 K/uL (ref 150–400)
RBC: 4.13 MIL/uL (ref 3.87–5.11)
RDW: 12.9 % (ref 11.5–15.5)
WBC: 9.9 K/uL (ref 4.0–10.5)
nRBC: 0 % (ref 0.0–0.2)

## 2023-10-16 LAB — BASIC METABOLIC PANEL WITH GFR
Anion gap: 11 (ref 5–15)
BUN: 32 mg/dL — ABNORMAL HIGH (ref 8–23)
CO2: 26 mmol/L (ref 22–32)
Calcium: 9.9 mg/dL (ref 8.9–10.3)
Chloride: 103 mmol/L (ref 98–111)
Creatinine, Ser: 1.06 mg/dL — ABNORMAL HIGH (ref 0.44–1.00)
GFR, Estimated: 54 mL/min — ABNORMAL LOW (ref >60)
Glucose, Bld: 96 mg/dL (ref 70–99)
Potassium: 4.4 mmol/L (ref 3.5–5.1)
Sodium: 140 mmol/L (ref 135–145)

## 2023-10-16 NOTE — Telephone Encounter (Signed)
 Called pt to schedule appointment, Patient is at ED  possible DVT  discussed with Ludie and patient advised to stay at ED because DVT would require ultrasound and coming here could only delay that process especially this late in the day

## 2023-10-16 NOTE — ED Provider Triage Note (Signed)
 Emergency Medicine Provider Triage Evaluation Note  Susan Welch , a 78 y.o. female  was evaluated in triage.  Pt complains of migrating area of tenderness/soreness in the left lower extremity.  Reached out to her cardiologist who recommended she come in for DVT eval.  Symptoms ongoing for about 1.5 weeks.  Review of Systems  Positive: As above Negative: As above  Physical Exam  BP (!) 155/61   Pulse 65   Temp 97.7 F (36.5 C)   Resp 16   Ht 5' 2 (1.575 m)   Wt 68 kg   SpO2 97%   BMI 27.44 kg/m  Gen:   Awake, no distress   Resp:  Normal effort  MSK:   Moves extremities without difficulty  Other:    Medical Decision Making  Medically screening exam initiated at 2:23 PM.  Appropriate orders placed.  Susan Welch was informed that the remainder of the evaluation will be completed by another provider, this initial triage assessment does not replace that evaluation, and the importance of remaining in the ED until their evaluation is complete.    Hildegard Loge, PA-C 10/16/23 1423

## 2023-10-16 NOTE — Telephone Encounter (Signed)
 Patient is concerned she is having a problem with her leg and is concerned it may be a blood clot.  Patient noted that she is not having any swelling but has a tender spot that is moving.      Moving tender spot- Started on the right side of the knee, a few days later it was above knee cap/in middle, now it is on inside of leg-thigh about half way up. She reports that it is going up the inside of thigh- it is a tender spot- to touch. It does not hurt on its own, it is just tender when she touches it.   She reports that she has had 2 knee replacements. She has no history of clots. She is wearing some compression hose at this time to try and help .   Informed her that I will send this to her Dr and then call her back. She verbalized understanding.

## 2023-10-16 NOTE — Telephone Encounter (Signed)
 Needs an appt

## 2023-10-16 NOTE — Telephone Encounter (Signed)
 FYI Only or Action Required?: FYI only for provider.  Patient was last seen in primary care on 05/15/2023 by Early, Camie BRAVO, NP.  Called Nurse Triage reporting Leg Pain.  Symptoms began a week ago.  Interventions attempted: Rest, hydration, or home remedies.  Symptoms are: gradually worsening.  Triage Disposition: See HCP Within 4 Hours (Or PCP Triage)  Patient/caregiver understands and will follow disposition?: Yes  Copied from CRM [id number]. Topic: Clinical - Red Word Triage >> Oct 16, 2023 12:30 PM Amy B wrote: Red Word that prompted transfer to Nurse Triage: tender spot on right knee moving up leg Reason for Disposition  [1] Thigh or calf pain AND [2] only 1 side AND [3] present > 1 hour  (Exception: Chronic unchanged pain.)  Answer Assessment - Initial Assessment Questions Pt states 1.5 weeks she noticed tender spot to side of right knee. Then above her knee. Now inside of thigh. Pt called provider and they recommend she be seen and evaluated. Admits to slight warmth in area, but unsure if swollen. Pt has had both knees replaced and concerned for blood clot. RN recommending UC/ED  1. ONSET: When did the pain start?      1.5 weeks ago 2. LOCATION: Where is the pain located?      leg 3. PAIN: How bad is the pain?    (Scale 1-10; or mild, moderate, severe)     2/10 4. WORK OR EXERCISE: Has there been any recent work or exercise that involved this part of the body?      Putting together furniture recently 5. CAUSE: What do you think is causing the leg pain?     Concern for dvt 6. OTHER SYMPTOMS: Do you have any other symptoms? (e.g., chest pain, back pain, breathing difficulty, swelling, rash, fever, numbness, weakness)     States it might be war,er than rest of leg but unsure  Protocols used: Leg Pain-A-AH

## 2023-10-16 NOTE — Telephone Encounter (Signed)
 Patient is concerned she is having a problem with her leg and is concerned it may be a blood clot.  Patient noted that she is not having any swelling but has a tender spot that is moving.

## 2023-10-16 NOTE — Telephone Encounter (Signed)
 Spoke with Dr Lavona about the patient symptoms. He recommends that the patient be seen by PCP or Urgent Care to day to assess this.   Called and s/w the pt, gave the information above and she verbalized understanding of all information.

## 2023-10-16 NOTE — ED Notes (Signed)
 Pt is ambulatory in waiting area, no distress at this time.  Delay explained.

## 2023-10-16 NOTE — ED Triage Notes (Addendum)
 Pt. Stated, I started to have upper leg pain in a spot and has moved to different areas. It only hurts if I press on it.

## 2023-10-16 NOTE — Progress Notes (Signed)
 Left lower extremity venous duplex completed. Preliminary report given to Amjad Ali, PA-C Results can be found under chart review under CV PROC. 10/16/2023 3:45 PM Rohit Deloria RVT, RDMS

## 2023-10-16 NOTE — ED Notes (Signed)
 Pt stated she was leaving. Moved OTF.

## 2023-10-21 ENCOUNTER — Other Ambulatory Visit (HOSPITAL_BASED_OUTPATIENT_CLINIC_OR_DEPARTMENT_OTHER): Payer: Self-pay

## 2024-01-09 ENCOUNTER — Other Ambulatory Visit (HOSPITAL_BASED_OUTPATIENT_CLINIC_OR_DEPARTMENT_OTHER): Payer: Self-pay | Admitting: Family

## 2024-01-09 ENCOUNTER — Other Ambulatory Visit (HOSPITAL_BASED_OUTPATIENT_CLINIC_OR_DEPARTMENT_OTHER): Payer: Self-pay

## 2024-01-09 DIAGNOSIS — E785 Hyperlipidemia, unspecified: Secondary | ICD-10-CM

## 2024-01-09 MED ORDER — EZETIMIBE 10 MG PO TABS
10.0000 mg | ORAL_TABLET | Freq: Every day | ORAL | 3 refills | Status: AC
  Filled 2024-01-18: qty 90, 90d supply, fill #0

## 2024-01-13 ENCOUNTER — Other Ambulatory Visit (HOSPITAL_BASED_OUTPATIENT_CLINIC_OR_DEPARTMENT_OTHER): Payer: Self-pay

## 2024-01-16 ENCOUNTER — Telehealth: Payer: Self-pay | Admitting: Pharmacy Technician

## 2024-01-16 ENCOUNTER — Other Ambulatory Visit (HOSPITAL_BASED_OUTPATIENT_CLINIC_OR_DEPARTMENT_OTHER): Payer: Self-pay

## 2024-01-16 ENCOUNTER — Other Ambulatory Visit (HOSPITAL_COMMUNITY): Payer: Self-pay

## 2024-01-16 NOTE — Telephone Encounter (Signed)
 Patient Advocate Encounter   The patient was approved for a [employer] grant that will help cover the cost of REPATHA  Total amount awarded, 2500.  Effective: 12/17/23 - 12/15/24   APW:[id number] ERW:[id number] Hmnle:[id number] PI:[id number] [employer] ID: [id number]   Pharmacy provided with approval and processing information. Patient informed via mychart

## 2024-01-16 NOTE — Telephone Encounter (Signed)
 Diagnosis verification required  Diag submitted

## 2024-01-17 ENCOUNTER — Other Ambulatory Visit (HOSPITAL_BASED_OUTPATIENT_CLINIC_OR_DEPARTMENT_OTHER): Payer: Self-pay

## 2024-01-19 ENCOUNTER — Other Ambulatory Visit (HOSPITAL_BASED_OUTPATIENT_CLINIC_OR_DEPARTMENT_OTHER): Payer: Self-pay

## 2024-01-19 ENCOUNTER — Other Ambulatory Visit: Payer: Self-pay

## 2024-01-19 NOTE — Telephone Encounter (Signed)
" ° °  Now hw grant under review "

## 2024-01-20 ENCOUNTER — Other Ambulatory Visit (HOSPITAL_BASED_OUTPATIENT_CLINIC_OR_DEPARTMENT_OTHER): Payer: Self-pay

## 2024-01-20 NOTE — Telephone Encounter (Signed)
 Still under review

## 2024-01-21 ENCOUNTER — Other Ambulatory Visit (HOSPITAL_COMMUNITY): Payer: Self-pay

## 2024-01-21 NOTE — Telephone Encounter (Signed)
" ° °  I asked sarah to run rx now under grant. "

## 2024-01-24 ENCOUNTER — Other Ambulatory Visit (HOSPITAL_BASED_OUTPATIENT_CLINIC_OR_DEPARTMENT_OTHER): Payer: Self-pay

## 2024-01-26 ENCOUNTER — Other Ambulatory Visit (HOSPITAL_BASED_OUTPATIENT_CLINIC_OR_DEPARTMENT_OTHER): Payer: Self-pay

## 2024-01-28 ENCOUNTER — Other Ambulatory Visit (HOSPITAL_BASED_OUTPATIENT_CLINIC_OR_DEPARTMENT_OTHER): Payer: Self-pay

## 2024-05-04 ENCOUNTER — Ambulatory Visit: Payer: Self-pay

## 2024-06-03 ENCOUNTER — Ambulatory Visit: Payer: Self-pay | Admitting: Nurse Practitioner
